# Patient Record
Sex: Female | Born: 1975 | Race: White | Hispanic: No
Health system: Southern US, Academic
[De-identification: ages and names within clinical notes are randomized; demographics above are authoritative.]

## PROBLEM LIST (undated history)

## (undated) ENCOUNTER — Non-Acute Institutional Stay: Payer: MEDICARE | Attending: Family | Primary: Family

## (undated) ENCOUNTER — Ambulatory Visit

## (undated) ENCOUNTER — Encounter

## (undated) ENCOUNTER — Encounter: Attending: Family | Primary: Family

## (undated) ENCOUNTER — Telehealth

## (undated) ENCOUNTER — Telehealth
Attending: Student in an Organized Health Care Education/Training Program | Primary: Student in an Organized Health Care Education/Training Program

## (undated) ENCOUNTER — Encounter: Attending: Ambulatory Care | Primary: Ambulatory Care

## (undated) ENCOUNTER — Ambulatory Visit: Payer: MEDICARE | Attending: Family | Primary: Family

## (undated) ENCOUNTER — Institutional Professional Consult (permissible substitution)

## (undated) ENCOUNTER — Telehealth: Attending: Family | Primary: Family

## (undated) ENCOUNTER — Encounter: Attending: Rheumatology | Primary: Rheumatology

## (undated) DIAGNOSIS — E119 Type 2 diabetes mellitus without complications: Secondary | ICD-10-CM

## (undated) DIAGNOSIS — J45909 Unspecified asthma, uncomplicated: Secondary | ICD-10-CM

## (undated) DIAGNOSIS — N200 Calculus of kidney: Secondary | ICD-10-CM

## (undated) DIAGNOSIS — E079 Disorder of thyroid, unspecified: Secondary | ICD-10-CM

## (undated) DIAGNOSIS — K219 Gastro-esophageal reflux disease without esophagitis: Secondary | ICD-10-CM

## (undated) DIAGNOSIS — M1990 Unspecified osteoarthritis, unspecified site: Secondary | ICD-10-CM

## (undated) HISTORY — DX: Disorder of thyroid, unspecified: E07.9

## (undated) HISTORY — DX: Gastro-esophageal reflux disease without esophagitis: K21.9

## (undated) HISTORY — DX: Unspecified osteoarthritis, unspecified site: M19.90

## (undated) HISTORY — DX: Unspecified asthma, uncomplicated: J45.909

## (undated) HISTORY — DX: Type 2 diabetes mellitus without complications: E11.9

---
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
## 2005-08-07 ENCOUNTER — Ambulatory Visit: Payer: Self-pay | Admitting: Family Medicine

## 2006-04-19 ENCOUNTER — Emergency Department: Payer: Self-pay | Admitting: Emergency Medicine

## 2006-12-30 ENCOUNTER — Ambulatory Visit: Payer: Self-pay | Admitting: Family Medicine

## 2009-12-21 ENCOUNTER — Emergency Department: Payer: Self-pay | Admitting: Emergency Medicine

## 2010-04-06 ENCOUNTER — Emergency Department: Payer: Self-pay | Admitting: Emergency Medicine

## 2011-01-07 ENCOUNTER — Ambulatory Visit: Payer: Self-pay | Admitting: Family Medicine

## 2011-07-14 ENCOUNTER — Other Ambulatory Visit: Payer: Self-pay | Admitting: Family Medicine

## 2011-08-21 ENCOUNTER — Ambulatory Visit: Payer: Self-pay | Admitting: Family Medicine

## 2014-02-02 ENCOUNTER — Emergency Department: Payer: Self-pay | Admitting: Emergency Medicine

## 2014-02-02 LAB — URINALYSIS, COMPLETE
BACTERIA: NONE SEEN
BILIRUBIN, UR: NEGATIVE
BLOOD: NEGATIVE
Glucose,UR: NEGATIVE mg/dL (ref 0–75)
Nitrite: NEGATIVE
PH: 6 (ref 4.5–8.0)
RBC,UR: 1 /HPF (ref 0–5)
Specific Gravity: 1.018 (ref 1.003–1.030)
Squamous Epithelial: 7
WBC UR: 11 /HPF (ref 0–5)

## 2014-02-02 LAB — COMPREHENSIVE METABOLIC PANEL
ALBUMIN: 3.5 g/dL (ref 3.4–5.0)
ALK PHOS: 143 U/L — AB
AST: 46 U/L — AB (ref 15–37)
Anion Gap: 11 (ref 7–16)
BUN: 11 mg/dL (ref 7–18)
Bilirubin,Total: 0.6 mg/dL (ref 0.2–1.0)
Calcium, Total: 9 mg/dL (ref 8.5–10.1)
Chloride: 101 mmol/L (ref 98–107)
Co2: 22 mmol/L (ref 21–32)
Creatinine: 0.9 mg/dL (ref 0.60–1.30)
EGFR (African American): >60
EGFR (Non-African Amer.): >60
Glucose: 180 mg/dL — ABNORMAL HIGH (ref 65–99)
Osmolality: 272 (ref 275–301)
Potassium: 3.6 mmol/L (ref 3.5–5.1)
SGPT (ALT): 51 U/L (ref 12–78)
SODIUM: 134 mmol/L — AB (ref 136–145)
Total Protein: 8.8 g/dL — ABNORMAL HIGH (ref 6.4–8.2)

## 2014-02-02 LAB — CBC WITH DIFFERENTIAL/PLATELET
BASOS ABS: 0.1 10*3/uL (ref 0.0–0.1)
BASOS PCT: 0.7 %
EOS ABS: 0.2 10*3/uL (ref 0.0–0.7)
Eosinophil %: 1 %
HCT: 39.1 % (ref 35.0–47.0)
HGB: 13 g/dL (ref 12.0–16.0)
LYMPHS PCT: 20.3 %
Lymphocyte #: 3.4 10*3/uL (ref 1.0–3.6)
MCH: 27 pg (ref 26.0–34.0)
MCHC: 33.2 g/dL (ref 32.0–36.0)
MCV: 81 fL (ref 80–100)
MONO ABS: 0.9 x10 3/mm (ref 0.2–0.9)
MONOS PCT: 5.2 %
Neutrophil #: 12.1 10*3/uL — ABNORMAL HIGH (ref 1.4–6.5)
Neutrophil %: 72.8 %
Platelet: 352 10*3/uL (ref 150–440)
RBC: 4.82 10*6/uL (ref 3.80–5.20)
RDW: 13.4 % (ref 11.5–14.5)
WBC: 16.6 10*3/uL — ABNORMAL HIGH (ref 3.6–11.0)

## 2014-02-02 LAB — LIPASE, BLOOD: Lipase: 119 U/L (ref 73–393)

## 2014-03-07 ENCOUNTER — Ambulatory Visit: Payer: Self-pay

## 2014-04-06 ENCOUNTER — Ambulatory Visit: Payer: Self-pay

## 2015-04-09 DIAGNOSIS — M069 Rheumatoid arthritis, unspecified: Secondary | ICD-10-CM | POA: Insufficient documentation

## 2015-04-09 DIAGNOSIS — E039 Hypothyroidism, unspecified: Secondary | ICD-10-CM | POA: Insufficient documentation

## 2015-04-15 ENCOUNTER — Emergency Department
Admit: 2015-04-15 | Disposition: A | Discharge status: Home / Self Care | Payer: Self-pay | Admitting: Emergency Medicine

## 2015-04-15 LAB — URINALYSIS, COMPLETE
BLOOD: NEGATIVE
Bilirubin,UR: NEGATIVE
Glucose,UR: NEGATIVE mg/dL (ref 0–75)
Ketone: NEGATIVE
LEUKOCYTE ESTERASE: NEGATIVE
NITRITE: NEGATIVE
PH: 5 (ref 4.5–8.0)
Protein: NEGATIVE
SPECIFIC GRAVITY: 1.025 (ref 1.003–1.030)

## 2015-04-23 ENCOUNTER — Ambulatory Visit: Payer: Self-pay

## 2015-05-06 ENCOUNTER — Emergency Department: Payer: Self-pay

## 2015-05-06 ENCOUNTER — Emergency Department
Admission: EM | Admit: 2015-05-06 | Discharge: 2015-05-06 | Disposition: A | Discharge status: Home / Self Care | Payer: Self-pay | Attending: Emergency Medicine | Admitting: Emergency Medicine

## 2015-05-06 ENCOUNTER — Encounter: Payer: Self-pay | Admitting: General Practice

## 2015-05-06 DIAGNOSIS — Z3202 Encounter for pregnancy test, result negative: Secondary | ICD-10-CM | POA: Insufficient documentation

## 2015-05-06 DIAGNOSIS — R319 Hematuria, unspecified: Secondary | ICD-10-CM

## 2015-05-06 DIAGNOSIS — N200 Calculus of kidney: Secondary | ICD-10-CM | POA: Insufficient documentation

## 2015-05-06 DIAGNOSIS — Z87891 Personal history of nicotine dependence: Secondary | ICD-10-CM | POA: Insufficient documentation

## 2015-05-06 DIAGNOSIS — R109 Unspecified abdominal pain: Secondary | ICD-10-CM

## 2015-05-06 HISTORY — DX: Calculus of kidney: N20.0

## 2015-05-06 LAB — URINALYSIS COMPLETE WITH MICROSCOPIC (ARMC ONLY)
Bacteria, UA: NONE SEEN
Bilirubin Urine: NEGATIVE
Glucose, UA: NEGATIVE mg/dL
LEUKOCYTES UA: NEGATIVE
NITRITE: NEGATIVE
PH: 5 (ref 5.0–8.0)
PROTEIN: 30 mg/dL — AB
SPECIFIC GRAVITY, URINE: 1.038 — AB (ref 1.005–1.030)
WBC, UA: NONE SEEN WBC/hpf (ref 0–5)

## 2015-05-06 LAB — COMPREHENSIVE METABOLIC PANEL
ALT: 24 U/L (ref 14–54)
AST: 30 U/L (ref 15–41)
Albumin: 3.7 g/dL (ref 3.5–5.0)
Alkaline Phosphatase: 114 U/L (ref 38–126)
Anion gap: 11 (ref 5–15)
BUN: 12 mg/dL (ref 6–20)
CO2: 21 mmol/L — AB (ref 22–32)
Calcium: 8.9 mg/dL (ref 8.9–10.3)
Chloride: 105 mmol/L (ref 101–111)
Creatinine, Ser: 0.84 mg/dL (ref 0.44–1.00)
GFR calc Af Amer: >60 mL/min (ref >60)
GLUCOSE: 145 mg/dL — AB (ref 65–99)
Potassium: 3.9 mmol/L (ref 3.5–5.1)
Sodium: 137 mmol/L (ref 135–145)
Total Bilirubin: 0.6 mg/dL (ref 0.3–1.2)
Total Protein: 8.1 g/dL (ref 6.5–8.1)

## 2015-05-06 LAB — CBC WITH DIFFERENTIAL/PLATELET
Basophils Absolute: 0.1 10*3/uL (ref 0–0.1)
Basophils Relative: 1 %
Eosinophils Absolute: 0.1 10*3/uL (ref 0–0.7)
Eosinophils Relative: 0 %
HCT: 38.1 % (ref 35.0–47.0)
Hemoglobin: 12.7 g/dL (ref 12.0–16.0)
LYMPHS ABS: 2.8 10*3/uL (ref 1.0–3.6)
Lymphocytes Relative: 16 %
MCH: 26.2 pg (ref 26.0–34.0)
MCHC: 33.3 g/dL (ref 32.0–36.0)
MCV: 78.8 fL — AB (ref 80.0–100.0)
MONOS PCT: 5 %
Monocytes Absolute: 0.8 10*3/uL (ref 0.2–0.9)
Neutro Abs: 13.6 10*3/uL — ABNORMAL HIGH (ref 1.4–6.5)
Neutrophils Relative %: 78 %
PLATELETS: 345 10*3/uL (ref 150–440)
RBC: 4.83 MIL/uL (ref 3.80–5.20)
RDW: 14 % (ref 11.5–14.5)
WBC: 17.4 10*3/uL — ABNORMAL HIGH (ref 3.6–11.0)

## 2015-05-06 LAB — POCT PREGNANCY, URINE: Preg Test, Ur: NEGATIVE

## 2015-05-06 LAB — LIPASE, BLOOD: Lipase: 26 U/L (ref 22–51)

## 2015-05-06 MED ORDER — MORPHINE SULFATE 4 MG/ML IJ SOLN
4.0000 mg | Freq: Once | INTRAMUSCULAR | Status: AC
  Administered 2015-05-06: 4 mg via INTRAVENOUS

## 2015-05-06 MED ORDER — ONDANSETRON HCL 4 MG/2ML IJ SOLN
4.0000 mg | Freq: Once | INTRAMUSCULAR | Status: AC
  Administered 2015-05-06: 4 mg via INTRAVENOUS

## 2015-05-06 MED ORDER — SODIUM CHLORIDE 0.9 % IV BOLUS (SEPSIS)
1000.0000 mL | Freq: Once | INTRAVENOUS | Status: AC
  Administered 2015-05-06: 1000 mL via INTRAVENOUS

## 2015-05-06 MED ORDER — TAMSULOSIN HCL 0.4 MG PO CAPS: 0.4000 mg | ORAL_CAPSULE | Freq: Every day | ORAL | Status: DC

## 2015-05-06 MED ORDER — ONDANSETRON HCL 4 MG/2ML IJ SOLN
INTRAMUSCULAR | Status: AC
Start: 2015-05-06 — End: 2015-05-06
  Administered 2015-05-06: 4 mg via INTRAVENOUS
  Filled 2015-05-06: qty 2

## 2015-05-06 MED ORDER — MORPHINE SULFATE 4 MG/ML IJ SOLN
INTRAMUSCULAR | Status: AC
  Filled 2015-05-06: qty 1

## 2015-05-06 MED ORDER — ONDANSETRON HCL 4 MG/2ML IJ SOLN
INTRAMUSCULAR | Status: AC
  Filled 2015-05-06: qty 2

## 2015-05-06 MED ORDER — KETOROLAC TROMETHAMINE 30 MG/ML IJ SOLN
30.0000 mg | Freq: Once | INTRAMUSCULAR | Status: AC
  Administered 2015-05-06: 30 mg via INTRAVENOUS

## 2015-05-06 MED ORDER — OXYCODONE-ACETAMINOPHEN 5-325 MG PO TABS: 1.0000 | ORAL_TABLET | ORAL | Status: DC | PRN

## 2015-05-06 MED ORDER — ONDANSETRON HCL 4 MG PO TABS: 4.0000 mg | ORAL_TABLET | Freq: Every day | ORAL | Status: AC | PRN

## 2015-05-06 MED ORDER — KETOROLAC TROMETHAMINE 30 MG/ML IJ SOLN
INTRAMUSCULAR | Status: AC
  Administered 2015-05-06: 30 mg via INTRAVENOUS
  Filled 2015-05-06: qty 1

## 2015-05-06 NOTE — ED Provider Notes (Signed)
[facility] Emergency Department Provider Note   ____________________________________________  Time seen: 1740  I have reviewed the triage vital signs and the nursing notes.   HISTORY  Chief Complaint Abdominal Pain   History limited by: Not Limited   HPI Ashley Cochran is a 39 y.o. female who presents to the emergency department because of left flank pain. This pain is been going on for 2 days. It is waxing and waning. Patient states it starts in her back and wraps around towards the front. She has had nausea and vomiting with pain. She states she has had kidney stones in the past. She denies any dysuria or hematuria. Denies any fevers.   Past Medical History  Diagnosis Date  . Kidney stones     There are no active problems to display for this patient.   No past surgical history on file.  Current Outpatient Rx  Name  Route  Sig  Dispense  Refill  . ondansetron (ZOFRAN) 4 MG tablet   Oral   Take 1 tablet (4 mg total) by mouth daily as needed for nausea or vomiting.   30 tablet   0   . oxyCODONE-acetaminophen (ROXICET) 5-325 MG per tablet   Oral   Take 1-2 tablets by mouth every 4 (four) hours as needed for severe pain.   20 tablet   0   . tamsulosin (FLOMAX) 0.4 MG CAPS capsule   Oral   Take 1 capsule (0.4 mg total) by mouth daily.   14 capsule   0     Allergies Review of patient's allergies indicates no known allergies.  No family history on file.  Social History History  Substance Use Topics  . Smoking status: Former Games developer  . Smokeless tobacco: Never Used  . Alcohol Use: No    Review of Systems  Constitutional: Negative for fever. Cardiovascular: Negative for chest pain. Respiratory: Negative for shortness of breath. Gastrointestinal: Left flank pain, nausea and vomiting Genitourinary: Negative for dysuria. Musculoskeletal: Negative for back pain. Skin: Negative for rash. Neurological: Negative for headaches,  focal weakness or numbness.   10-point ROS otherwise negative.  ____________________________________________   PHYSICAL EXAM:  VITAL SIGNS: ED Triage Vitals  Enc Vitals Group     BP 05/06/15 1635 142/64 mmHg     Pulse Rate 05/06/15 1635 80     Resp 05/06/15 1635 22     Temp 05/06/15 1635 97.8 F (36.6 C)     Temp Source 05/06/15 1635 Oral     SpO2 05/06/15 1635 99 %     Weight 05/06/15 1635 244 lb (110.678 kg)     Height 05/06/15 1635 5\' 6"  (1.676 m)     Head Cir --      Peak Flow --      Pain Score 05/06/15 1635 10   Constitutional: Alert and oriented. Appears to be to be in mild pain Eyes: Conjunctivae are normal. PERRL. Normal extraocular movements. ENT   Head: Normocephalic and atraumatic.   Nose: No congestion/rhinnorhea.   Mouth/Throat: Mucous membranes are moist.   Neck: No stridor. Hematological/Lymphatic/Immunilogical: No cervical lymphadenopathy. Cardiovascular: Normal rate, regular rhythm.  No murmurs, rubs, or gallops. Respiratory: Normal respiratory effort without tachypnea nor retractions. Breath sounds are clear and equal bilaterally. No wheezes/rales/rhonchi. Gastrointestinal: Soft and minimally tender on the left side. No rebound, no guarding Genitourinary: Deferred Musculoskeletal: Normal range of motion in all extremities. No joint effusions.  No lower extremity tenderness nor edema. Neurologic:  Normal speech and language.  No gross focal neurologic deficits are appreciated. Speech is normal.  Skin:  Skin is warm, dry and intact. No rash noted. Psychiatric: Mood and affect are normal. Speech and behavior are normal. Patient exhibits appropriate insight and judgment.  ____________________________________________    LABS (pertinent positives/negatives)  Labs Reviewed  CBC WITH DIFFERENTIAL/PLATELET - Abnormal; Notable for the following:    WBC 17.4 (*)    MCV 78.8 (*)    Neutro Abs 13.6 (*)    All other components within normal limits   COMPREHENSIVE METABOLIC PANEL - Abnormal; Notable for the following:    CO2 21 (*)    Glucose, Bld 145 (*)    All other components within normal limits  URINALYSIS COMPLETEWITH MICROSCOPIC ([facility])  - Abnormal; Notable for the following:    Color, Urine AMBER (*)    APPearance TURBID (*)    Ketones, ur TRACE (*)    Specific Gravity, Urine 1.038 (*)    Hgb urine dipstick 1+ (*)    Protein, ur 30 (*)    Squamous Epithelial / LPF 0-5 (*)    All other components within normal limits  LIPASE, BLOOD  POC URINE PREG, ED  POCT PREGNANCY, URINE     ____________________________________________   EKG  None  ____________________________________________    RADIOLOGY  Renal ultrasound IMPRESSION: 1. No hydronephrosis. 2. 9 mm LEFT renal collecting system calculus.  ____________________________________________   PROCEDURES  Procedure(s) performed: None  Critical Care performed: No  ____________________________________________   INITIAL IMPRESSION / ASSESSMENT AND PLAN / ED COURSE  Pertinent labs & imaging results that were available during my care of the patient were reviewed by me and considered in my medical decision making (see chart for details).  Patient here with left flank pain, nausea and vomiting. Patient does have history of kidney stones. Patient does have blood in her urine. Ultrasound does not show any hydronephrosis overdose showing 9 mm left renal collecting system calculus. Bilateral ureteral jets. Think likely patient is suffering from kidney stone. We will treat with pain medication and antiemetics and Flomax. Will give patient urology numbers for follow-up as needed.  ____________________________________________   FINAL CLINICAL IMPRESSION(S) / ED DIAGNOSES  Final diagnoses:  Hematuria  Flank pain  Nephrolithiasis     Phineas Semen, MD 05/06/15 2029

## 2015-05-06 NOTE — ED Notes (Signed)
Past 3 days c/o left side abd, vomited today

## 2015-05-06 NOTE — ED Notes (Signed)
Pt. Arrived to ed from home with reports of LLQ pain x 3 days. Pt reports started gradually but has increased over the last 24hrs. Pt denies urinary symptoms at this time. Reports hx of kidney stones.  PT alert and oriented, screaming out with pain. Pt reports episode of nausea and vomiting today.

## 2015-05-06 NOTE — Discharge Instructions (Signed)
Please seek medical attention for any high fevers, chest pain, shortness of breath, change in behavior, persistent vomiting, bloody stool or any other new or concerning symptoms.  Abdominal Pain, Women Abdominal (stomach, pelvic, or belly) pain can be caused by many things. It is important to tell your doctor:  The location of the pain.  Does it come and go or is it present all the time?  Are there things that start the pain (eating certain foods, exercise)?  Are there other symptoms associated with the pain (fever, nausea, vomiting, diarrhea)? All of this is helpful to know when trying to find the cause of the pain. CAUSES   Stomach: virus or bacteria infection, or ulcer.  Intestine: appendicitis (inflamed appendix), regional ileitis (Crohn's disease), ulcerative colitis (inflamed colon), irritable bowel syndrome, diverticulitis (inflamed diverticulum of the colon), or cancer of the stomach or intestine.  Gallbladder disease or stones in the gallbladder.  Kidney disease, kidney stones, or infection.  Pancreas infection or cancer.  Fibromyalgia (pain disorder).  Diseases of the female organs:  Uterus: fibroid (non-cancerous) tumors or infection.  Fallopian tubes: infection or tubal pregnancy.  Ovary: cysts or tumors.  Pelvic adhesions (scar tissue).  Endometriosis (uterus lining tissue growing in the pelvis and on the pelvic organs).  Pelvic congestion syndrome (female organs filling up with blood just before the menstrual period).  Pain with the menstrual period.  Pain with ovulation (producing an egg).  Pain with an IUD (intrauterine device, birth control) in the uterus.  Cancer of the female organs.  Functional pain (pain not caused by a disease, may improve without treatment).  Psychological pain.  Depression. DIAGNOSIS  Your doctor will decide the seriousness of your pain by doing an examination.  Blood tests.  X-rays.  Ultrasound.  CT scan  (computed tomography, special type of X-ray).  MRI (magnetic resonance imaging).  Cultures, for infection.  Barium enema (dye inserted in the large intestine, to better view it with X-rays).  Colonoscopy (looking in intestine with a lighted tube).  Laparoscopy (minor surgery, looking in abdomen with a lighted tube).  Major abdominal exploratory surgery (looking in abdomen with a large incision). TREATMENT  The treatment will depend on the cause of the pain.   Many cases can be observed and treated at home.  Over-the-counter medicines recommended by your caregiver.  Prescription medicine.  Antibiotics, for infection.  Birth control pills, for painful periods or for ovulation pain.  Hormone treatment, for endometriosis.  Nerve blocking injections.  Physical therapy.  Antidepressants.  Counseling with a psychologist or psychiatrist.  Minor or major surgery. HOME CARE INSTRUCTIONS   Do not take laxatives, unless directed by your caregiver.  Take over-the-counter pain medicine only if ordered by your caregiver. Do not take aspirin because it can cause an upset stomach or bleeding.  Try a clear liquid diet (broth or water) as ordered by your caregiver. Slowly move to a bland diet, as tolerated, if the pain is related to the stomach or intestine.  Have a thermometer and take your temperature several times a day, and record it.  Bed rest and sleep, if it helps the pain.  Avoid sexual intercourse, if it causes pain.  Avoid stressful situations.  Keep your follow-up appointments and tests, as your caregiver orders.  If the pain does not go away with medicine or surgery, you may try:  Acupuncture.  Relaxation exercises (yoga, meditation).  Group therapy.  Counseling. SEEK MEDICAL CARE IF:   You notice certain foods cause stomach  pain.  Your home care treatment is not helping your pain.  You need stronger pain medicine.  You want your IUD removed.  You  feel faint or lightheaded.  You develop nausea and vomiting.  You develop a rash.  You are having side effects or an allergy to your medicine. SEEK IMMEDIATE MEDICAL CARE IF:   Your pain does not go away or gets worse.  You have a fever.  Your pain is felt only in portions of the abdomen. The right side could possibly be appendicitis. The left lower portion of the abdomen could be colitis or diverticulitis.  You are passing blood in your stools (bright red or black tarry stools, with or without vomiting).  You have blood in your urine.  You develop chills, with or without a fever.  You pass out. MAKE SURE YOU:   Understand these instructions.  Will watch your condition.  Will get help right away if you are not doing well or get worse. Document Released: 10/04/2007 Document Revised: 04/23/2014 Document Reviewed: 10/24/2009 [facility] Patient Information 2015 [place], [place]. This information is not intended to replace advice given to you by your health care provider. Make sure you discuss any questions you have with your health care provider.  Kidney Stones Kidney stones (urolithiasis) are solid masses that form inside your kidneys. The intense pain is caused by the stone moving through the kidney, ureter, bladder, and urethra (urinary tract). When the stone moves, the ureter starts to spasm around the stone. The stone is usually passed in your pee (urine).  HOME CARE  Drink enough fluids to keep your pee clear or pale yellow. This helps to get the stone out.  Strain all pee through the provided strainer. Do not pee without peeing through the strainer, not even once. If you pee the stone out, catch it in the strainer. The stone may be as small as a grain of salt. Take this to your doctor. This will help your doctor figure out what you can do to try to prevent more kidney stones.  Only take medicine as told by your doctor.  Follow up with your doctor as told.  Get follow-up  X-rays as told by your doctor. GET HELP IF: You have pain that gets worse even if you have been taking pain medicine. GET HELP RIGHT AWAY IF:   Your pain does not get better with medicine.  You have a fever or shaking chills.  Your pain increases and gets worse over 18 hours.  You have new belly (abdominal) pain.  You feel faint or pass out.  You are unable to pee. MAKE SURE YOU:   Understand these instructions.  Will watch your condition.  Will get help right away if you are not doing well or get worse. Document Released: 05/25/2008 Document Revised: 08/09/2013 Document Reviewed: 05/10/2013 [facility] Patient Information 2015 [place], [place]. This information is not intended to replace advice given to you by your health care provider. Make sure you discuss any questions you have with your health care provider.

## 2015-05-06 NOTE — ED Notes (Signed)
Patient transported to Ultrasound 

## 2015-05-06 NOTE — ED Notes (Signed)
Pt returned from [place] and states she is still vomiting and has continued pain. Lights out to enhance rest and visitor by the beside. MD aware.

## 2015-05-23 ENCOUNTER — Other Ambulatory Visit: Payer: Self-pay

## 2015-05-28 ENCOUNTER — Ambulatory Visit: Payer: Self-pay

## 2015-06-27 ENCOUNTER — Other Ambulatory Visit: Payer: Self-pay

## 2015-07-02 ENCOUNTER — Other Ambulatory Visit: Payer: Self-pay

## 2015-07-09 ENCOUNTER — Institutional Professional Consult (permissible substitution): Payer: Self-pay

## 2015-07-09 LAB — LIPID PANEL
CHOLESTEROL: 141 mg/dL (ref 0–200)
HDL: 31 mg/dL — AB (ref 35–70)
LDL Cholesterol: 74 mg/dL
Triglycerides: 181 mg/dL — AB (ref 40–160)

## 2015-07-09 LAB — MICROALBUMIN, URINE: MICROALB UR: 3.8

## 2015-09-24 ENCOUNTER — Other Ambulatory Visit: Payer: Self-pay

## 2015-09-24 ENCOUNTER — Ambulatory Visit: Payer: Self-pay

## 2015-09-24 LAB — BASIC METABOLIC PANEL
BUN: 12 mg/dL (ref 4–21)
Creatinine: 0.5 mg/dL (ref 0.5–1.1)
GLUCOSE: 123 mg/dL
Potassium: 4.2 mmol/L (ref 3.4–5.3)
Sodium: 136 mmol/L — AB (ref 137–147)

## 2015-09-24 LAB — HEPATIC FUNCTION PANEL
ALT: 36 U/L — AB (ref 7–35)
AST: 38 U/L — AB (ref 13–35)
Alkaline Phosphatase: 125 U/L (ref 25–125)
Bilirubin, Total: 0.2 mg/dL

## 2015-10-08 ENCOUNTER — Institutional Professional Consult (permissible substitution): Payer: Self-pay

## 2015-10-22 ENCOUNTER — Ambulatory Visit: Payer: Self-pay

## 2015-10-22 DIAGNOSIS — R55 Syncope and collapse: Secondary | ICD-10-CM | POA: Insufficient documentation

## 2015-11-21 ENCOUNTER — Other Ambulatory Visit: Payer: Self-pay

## 2015-11-21 LAB — TSH: TSH: 4.6 u[IU]/mL (ref <=5.90)

## 2015-11-28 ENCOUNTER — Ambulatory Visit: Payer: Self-pay

## 2016-01-09 ENCOUNTER — Other Ambulatory Visit: Payer: Self-pay

## 2016-02-11 ENCOUNTER — Institutional Professional Consult (permissible substitution): Payer: Self-pay

## 2016-02-11 LAB — HEMOGLOBIN A1C: Hemoglobin A1C: 6.5

## 2016-03-12 DIAGNOSIS — R55 Syncope and collapse: Secondary | ICD-10-CM

## 2016-03-12 DIAGNOSIS — E119 Type 2 diabetes mellitus without complications: Secondary | ICD-10-CM

## 2016-03-12 DIAGNOSIS — Z794 Long term (current) use of insulin: Principal | ICD-10-CM

## 2016-03-12 DIAGNOSIS — E039 Hypothyroidism, unspecified: Secondary | ICD-10-CM

## 2016-05-05 ENCOUNTER — Ambulatory Visit: Payer: Self-pay

## 2016-05-05 DIAGNOSIS — E119 Type 2 diabetes mellitus without complications: Secondary | ICD-10-CM

## 2016-05-05 DIAGNOSIS — E039 Hypothyroidism, unspecified: Secondary | ICD-10-CM

## 2016-05-05 NOTE — Progress Notes (Unsigned)
[facility] Progress Note  05/05/2016  Name: Ashley Cochran  MRN: [id number]  Date of Birth: February 20, 1976  Chief Complaint: No chief complaint on file. follow up diabetes and hypothyroidism.  HPI: HPI   40 y.o. female seen in follow up. 1. Type 2 diabetes. Last A1c in 01/2016 was  6.5%. Only medication for diabetes is Victoza, which she tolerates. No known complications of diabetes. Up to date on annual eye exam. Does not check sugars often. Has a glucometer, but when she checked previously sugars were consistently in low 100s.  2. Hypothyroidism. Takes daily levothyroxine. No recent dose changes.   Past Medical History:  Past Medical History  Diagnosis Date  . Kidney stones   . Diabetes mellitus without complication (HCC)   . Asthma   . GERD (gastroesophageal reflux disease)   . Thyroid disease   . Arthritis     Rheumatoid Arthritis    Past Surgical History: No past surgical history on file.  Past Family History:  Family History  Problem Relation Age of Onset  . Hypertension Mother   . Diabetes Mother     type 2  . Asthma Mother   . Stroke Mother     Mini strokes  . GER disease Mother   . Hypertension Father   . Diabetes Father     Type 2  . Asthma Father   . GER disease Father   . Asthma Sister   . Allergies Sister   . Asthma Brother   . Allergies Brother    Medications:   Medication List       This list is accurate as of: 05/05/16  6:56 PM.  Always use your most recent med list.               ADVAIR DISKUS 250-50 MCG/DOSE Aepb  Generic drug:  Fluticasone-Salmeterol  Inhale 1 puff into the lungs 2 (two) times daily.     albuterol 108 (90 Base) MCG/ACT inhaler  Commonly known as:  PROVENTIL HFA;VENTOLIN HFA  Inhale 2 puffs into the lungs every 6 (six) hours as needed for wheezing or shortness of breath.     cyclobenzaprine 10 MG tablet  Commonly known as:  FLEXERIL  Take 10 mg by mouth as needed for muscle spasms (every 8 hours  as needed for spasms.).     levothyroxine 175 MCG tablet  Commonly known as:  SYNTHROID, LEVOTHROID  Take 175 mcg by mouth at bedtime.     montelukast 10 MG tablet  Commonly known as:  SINGULAIR  Take 10 mg by mouth daily.     naproxen 500 MG tablet  Commonly known as:  NAPROSYN  Take 500 mg by mouth 2 (two) times daily with a meal.     omeprazole 20 MG capsule  Commonly known as:  PRILOSEC  Take 20 mg by mouth daily.     ondansetron 4 MG tablet  Commonly known as:  ZOFRAN  Take 1 tablet (4 mg total) by mouth daily as needed for nausea or vomiting.     oxyCODONE-acetaminophen 5-325 MG tablet  Commonly known as:  ROXICET  Take 1-2 tablets by mouth every 4 (four) hours as needed for severe pain.     tamsulosin 0.4 MG Caps capsule  Commonly known as:  FLOMAX  Take 1 capsule (0.4 mg total) by mouth daily.     VICTOZA 18 MG/3ML Sopn  Generic drug:  Liraglutide  Inject 1.8 mg into the skin daily.  General: alert, no distress and moderately obese Neck: no adenopathy, no carotid bruit, no JVD and thyroid not enlarged, symmetric, no tenderness/mass/nodules Resp: clear to auscultation bilaterally Cardio: regular rate and rhythm, S1, S2 normal, no murmur, click, rub or gallop GI: normal findings: bowel sounds normal and soft, non-tender Extremities: edema trace pitting bilaterally  Diabetic foot exam: normal sensation to monofilament, no calluses or ulcerations  Clinical Assessment & Plan 1. Type 2 diabetes, controlled. -Continue Victoza -Instructed her to check sugars daily, fasting. -Check labs today including A1c + urine ma/cr + metC and lipids.  2. Hypothyroidism -Check TSH and adjust dose of levothyroxine if needed.   Follow-up in 3 months.

## 2016-05-06 LAB — SPECIMEN STATUS

## 2016-05-07 LAB — MICROALBUMIN / CREATININE URINE RATIO
CREATININE, UR: 247.1 mg/dL
MICROALB/CREAT RATIO: 3.6 mg/g creat (ref 0.0–30.0)
Microalbumin, Urine: 8.8 ug/mL

## 2016-05-07 LAB — COMPREHENSIVE METABOLIC PANEL

## 2016-05-07 LAB — LIPID PANEL

## 2016-05-07 LAB — SPECIMEN STATUS REPORT

## 2016-05-07 LAB — TSH

## 2016-07-15 ENCOUNTER — Other Ambulatory Visit: Payer: Self-pay | Admitting: Urology

## 2016-07-16 ENCOUNTER — Other Ambulatory Visit: Payer: Self-pay | Admitting: Internal Medicine

## 2016-08-04 ENCOUNTER — Ambulatory Visit: Payer: Self-pay

## 2016-08-19 DIAGNOSIS — N200 Calculus of kidney: Secondary | ICD-10-CM | POA: Insufficient documentation

## 2016-08-19 HISTORY — DX: Calculus of kidney: N20.0

## 2016-08-20 ENCOUNTER — Other Ambulatory Visit: Payer: Self-pay | Admitting: Urology

## 2016-09-08 ENCOUNTER — Ambulatory Visit: Payer: Self-pay | Admitting: Endocrinology

## 2016-09-08 ENCOUNTER — Encounter: Payer: Self-pay | Admitting: Endocrinology

## 2016-09-08 DIAGNOSIS — E119 Type 2 diabetes mellitus without complications: Secondary | ICD-10-CM

## 2016-09-08 DIAGNOSIS — Z794 Long term (current) use of insulin: Principal | ICD-10-CM

## 2016-09-08 NOTE — Progress Notes (Signed)
Assessment:   Type 2 Diabetes without complication     Plan:    1.  Rx changes: none  2.  Education: Reviewed diabetes management:  Appropriate A1C target, avoid hypoglycemia,  blood pressure (<140/80), and cholesterol (LDL <100). -   Reviewed importance of good glycemic control to reduce risk for complications.   3.   Get regular physical activity at least 30 minutes a day of moderate intensity most days of the week  This discussion took at least 15 minutes out of a total visit time of 25 minutes today.  4. Follow up: I recommend patient follow-up with me at 3 months.   5. Referrals: none     Subjective:    Ashley Cochran is a 40 y.o. female who is seen in follow up forType 2 diabetes from 05/05/2016  complicated by obesity. T2DM for approximately 5 years.    Cardiovascular risk factors: dyslipidemia, obesity and physical inactivity   Eye exam current (within one year): yes on the 24 of August  .  Jacqulyn Barresi is performing SMBG on average 0-1 times per day. Is only checking when not feeling well.    Denies the following:  post prandial excursions, fasting hypoglycemia,  fasting hyperglycemia, overcorrection, rebound and high variability.  Reported syncope. Pain in toe and works caudally up to the calf -- have to walk. Feels like a cramping and notices a knot in calf during these episodes. Denies dehydration.   Reports no symptoms of hypoglycemia. Lowest blood sugar is in the mid 90s. Has seen a 150 in the past month only once. Reported average is 100.   Denies chest pain, shortness of breath, edema, foot lesions or ulcers. Denies nausea -- less than one month.   Denies severe hypoglycemia or admission to hospital for DKA.  Current exercise: none  The patient's history was reviewed and updated as appropriate.  Allergies  Allergen Reactions  . Latex   . Strawberry Flavor      Current Outpatient Prescriptions:  .  albuterol (PROVENTIL HFA;VENTOLIN HFA)  108 (90 Base) MCG/ACT inhaler, Inhale 2 puffs into the lungs every 6 (six) hours as needed for wheezing or shortness of breath., Disp: , Rfl:  .  cyclobenzaprine (FLEXERIL) 10 MG tablet, Take 10 mg by mouth as needed for muscle spasms (every 8 hours as needed for spasms.)., Disp: , Rfl:  .  Fluticasone-Salmeterol (ADVAIR DISKUS) 250-50 MCG/DOSE AEPB, Inhale 1 puff into the lungs 2 (two) times daily., Disp: , Rfl:  .  levothyroxine (SYNTHROID, LEVOTHROID) 175 MCG tablet, Take 175 mcg by mouth at bedtime., Disp: , Rfl:  .  Liraglutide (VICTOZA) 18 MG/3ML SOPN, Inject 1.8 mg into the skin daily., Disp: , Rfl:  .  montelukast (SINGULAIR) 10 MG tablet, Take 10 mg by mouth daily., Disp: , Rfl:  .  naproxen (NAPROSYN) 500 MG tablet, Take 500 mg by mouth 2 (two) times daily with a meal., Disp: , Rfl:  .  omeprazole (PRILOSEC) 20 MG capsule, TAKE ONE CAPSULE BY MOUTH EVERY DAY, Disp: 90 capsule, Rfl: 0 .  oxyCODONE-acetaminophen (ROXICET) 5-325 MG per tablet, Take 1-2 tablets by mouth every 4 (four) hours as needed for severe pain., Disp: 20 tablet, Rfl: 0 .  tamsulosin (FLOMAX) 0.4 MG CAPS capsule, Take 1 capsule (0.4 mg total) by mouth daily., Disp: 14 capsule, Rfl: 0  Social History   Social History  . Marital status: Married    Spouse name: N/A  . Number of children: N/A  . Years  of education: N/A   Social History Main Topics  . Smoking status: Former Smoker    Quit date: 09/20/2004  . Smokeless tobacco: Never Used  . Alcohol use No  . Drug use: No  . Sexual activity: Not on file   Other Topics Concern  . Not on file   Social History Narrative  . No narrative on file    Family History  Problem Relation Age of Onset  . Hypertension Mother   . Diabetes Mother     type 2  . Asthma Mother   . Stroke Mother     Mini strokes  . GER disease Mother   . Hypertension Father   . Diabetes Father     Type 2  . Asthma Father   . GER disease Father   . Asthma Sister   . Allergies Sister    . Asthma Brother   . Allergies Brother     Review of Systems A 12 point review of systems was negative except for pertinent items noted in the HPI.   Objective:     Wt Readings from Last 3 Encounters:  02/11/16 264 lb (119.7 kg)  05/06/15 244 lb (110.7 kg)   There were no vitals taken for this visit.  General appearance:  alert and appears stated age, in no distress      Eyes:  conjunctivae/corneas clear. EOM's intact. Sclera anicteric. Negative lid lag or proptosis     Neck: no adenopathy, supple, symmetrical, trachea midline  Thyroid:  Mobile, normal size, no palpable nodule  Lung: clear to auscultation bilaterally  Heart:  regular rate and rhythm, S1, S2 normal, no murmur, click, rub or gallop  Abdomen:  bowel sounds normal; negative bruits  Extremities: extremities normal, atraumatic, no cyanosis or edema     Pulses: DP & PT 2+ and symmetric.  Neuro: normal without focal findings, mental status, speech normal, alert and oriented x3, reflexes normal and symmetric and gait normal.   Feet: negative lesions or ulcers, 10 gram monofilament intact bilateral plantar surface. Very reactive to touch.      Lab Review No components found for: A1C Glucose (mg/dL)  Date Value  98/26/4158 CANCELED  02/02/2014 180 (H)   Glucose, Bld (mg/dL)  Date Value  30/94/0768 145 (H)   CO2 (mmol/L)  Date Value  05/06/2015 21 (L)   Co2 (mmol/L)  Date Value  02/02/2014 22   BUN (mg/dL)  Date Value  08/81/1031 12  05/06/2015 12  02/02/2014 11   Creatinine (mg/dL)  Date Value  59/45/8592 0.5  02/02/2014 0.90   Creatinine, Ser (mg/dL)  Date Value  92/44/6286 0.84   No components found for: LDL,  LDLCALC,  LDLDIRECT Lab Results  Component Value Date   NA 136 (A) 09/24/2015   K 4.2 09/24/2015   CL 105 05/06/2015   CO2 21 (L) 05/06/2015   BUN 12 09/24/2015   CREATININE 0.5 09/24/2015   GFRAA >60 05/06/2015   GFRNONAA >60 05/06/2015   GLU 123 09/24/2015   CALCIUM 8.9  05/06/2015   ALBUMIN 3.7 05/06/2015     DIABETES MELLITUS RESULTS: Lab Results  Component Value Date   HGBA1C 6.5 02/11/2016   Lab Results  Component Value Date   MICROALBUR 3.8 07/09/2015   LDLCALC 74 07/09/2015   CREATININE 0.5 09/24/2015   Lab Results  Component Value Date   CHOL CANCELED 05/05/2016   Lab Results  Component Value Date   LDLCALC 74 07/09/2015   No components found for:  CHOLLLDLDIRECT No components found for: MICROALB/CR Lab Results  Component Value Date   GFRAA >60 05/06/2015   GFRNONAA >60 05/06/2015

## 2016-11-06 ENCOUNTER — Other Ambulatory Visit: Payer: Self-pay | Admitting: Urology

## 2016-12-08 ENCOUNTER — Encounter: Payer: Self-pay | Admitting: Endocrinology

## 2016-12-08 ENCOUNTER — Ambulatory Visit: Payer: Self-pay | Admitting: Endocrinology

## 2016-12-08 DIAGNOSIS — E119 Type 2 diabetes mellitus without complications: Secondary | ICD-10-CM

## 2016-12-08 LAB — POCT GLYCOSYLATED HEMOGLOBIN (HGB A1C): Hemoglobin A1C: 5.7

## 2016-12-08 LAB — GLUCOSE, POCT (MANUAL RESULT ENTRY): POC GLUCOSE: 114 mg/dL — AB (ref 70–99)

## 2016-12-08 MED ORDER — OMEPRAZOLE 20 MG PO CPDR: 20.0000 mg | DELAYED_RELEASE_CAPSULE | Freq: Every day | ORAL | 4 refills | Status: DC

## 2016-12-08 MED ORDER — FLUTICASONE-SALMETEROL 250-50 MCG/DOSE IN AEPB: 1.0000 | INHALATION_SPRAY | Freq: Two times a day (BID) | RESPIRATORY_TRACT | 4 refills | Status: DC

## 2016-12-08 MED ORDER — LIRAGLUTIDE 18 MG/3ML ~~LOC~~ SOPN: 1.8000 mg | PEN_INJECTOR | Freq: Every day | SUBCUTANEOUS | 3 refills | Status: DC

## 2016-12-08 MED ORDER — NAPROXEN 500 MG PO TABS
250.0000 mg | ORAL_TABLET | Freq: Three times a day (TID) | ORAL | 4 refills | Status: DC | PRN
Start: 2016-12-08 — End: 2018-09-01

## 2016-12-08 MED ORDER — ONDANSETRON HCL 4 MG PO TABS: 4.0000 mg | ORAL_TABLET | Freq: Two times a day (BID) | ORAL | 1 refills | Status: DC

## 2016-12-08 MED ORDER — TRIAMCINOLONE ACETONIDE 55 MCG/ACT NA AERO: 2.0000 | INHALATION_SPRAY | Freq: Every day | NASAL | 4 refills | Status: DC

## 2016-12-08 MED ORDER — ALBUTEROL SULFATE HFA 108 (90 BASE) MCG/ACT IN AERS: 2.0000 | INHALATION_SPRAY | Freq: Four times a day (QID) | RESPIRATORY_TRACT | 4 refills | Status: DC | PRN

## 2016-12-08 MED ORDER — LEVOTHYROXINE SODIUM 125 MCG PO TABS: 125.0000 ug | ORAL_TABLET | Freq: Every day | ORAL | 4 refills | Status: DC

## 2016-12-08 MED ORDER — LEVOTHYROXINE SODIUM 175 MCG PO TABS: 175.0000 ug | ORAL_TABLET | Freq: Every day | ORAL | 4 refills | Status: DC

## 2016-12-08 MED ORDER — MONTELUKAST SODIUM 10 MG PO TABS: 10.0000 mg | ORAL_TABLET | Freq: Every day | ORAL | 4 refills | Status: DC

## 2016-12-08 MED ORDER — TRAZODONE HCL 50 MG PO TABS: 50.0000 mg | ORAL_TABLET | Freq: Every day | ORAL | 4 refills | Status: DC

## 2016-12-08 NOTE — Progress Notes (Signed)
[facility] Progress Note  12/08/2016  Name: Ashley Cochran  MRN: [id number]  Date of Birth: 09-02-1976  Chief Complaint:  Chief Complaint  Patient presents with  . Diabetes    HPI: HPI  Past Medical History:  Past Medical History:  Diagnosis Date  . Arthritis    Rheumatoid Arthritis  . Asthma   . Diabetes mellitus without complication (HCC)   . GERD (gastroesophageal reflux disease)   . Kidney stones   . Thyroid disease     Past Surgical History: No past surgical history on file.  Past Family History:  Family History  Problem Relation Age of Onset  . Hypertension Mother   . Diabetes Mother     type 2  . Asthma Mother   . Stroke Mother     Mini strokes  . GER disease Mother   . Hypertension Father   . Diabetes Father     Type 2  . Asthma Father   . GER disease Father   . Asthma Sister   . Allergies Sister   . Asthma Brother   . Allergies Brother     Diabetes Management:  Lab Results  Component Value Date   HGBA1C 6.5 02/11/2016    Meter here todayNo - glucose are 96-115 most of the time. Checking occasionally.   Hypoglycemia in the last 3 monthsNo - 2  Highest blood sugar seen in last 1-2 months? 115  Reported blood sugar average: 100  Trouble with managing blood sugar : none  New Complaints: none except for numbness of right arm depending on position in bed  Personal Diabetes Goal: work on diet and try to lose weight 1 pound per week.  Goal weight 185.      Clinical Assessment & Plan  Continue Victoza.   No change in other meds.   Had all the annual labs she needs at [facility] in October.  Will need TSH, fasting lipid panel and MA/Cr ratio in May

## 2016-12-08 NOTE — Addendum Note (Signed)
Addended by: Erin Sons on: 12/08/2016 08:38 PM   Modules accepted: Orders

## 2016-12-09 LAB — LIPID PANEL
Chol/HDL Ratio: 3.3 ratio units (ref 0.0–4.4)
Cholesterol, Total: 135 mg/dL (ref 100–199)
HDL: 41 mg/dL (ref >39)
LDL CALC: 71 mg/dL (ref 0–99)
Triglycerides: 116 mg/dL (ref 0–149)
VLDL CHOLESTEROL CAL: 23 mg/dL (ref 5–40)

## 2016-12-09 LAB — TSH: TSH: 7.69 u[IU]/mL — AB (ref 0.450–4.500)

## 2016-12-17 ENCOUNTER — Telehealth: Payer: Self-pay

## 2016-12-17 NOTE — Telephone Encounter (Signed)
Received 2 PAP application from [facility] for Ventolin HFA 90 MCG placed for provider to sign.

## 2016-12-24 NOTE — Telephone Encounter (Signed)
Placed signed application/script in [facility] folder for pickup. 

## 2017-01-14 ENCOUNTER — Other Ambulatory Visit: Payer: Self-pay

## 2017-01-14 NOTE — Telephone Encounter (Signed)
Received PAP application from [facility] for Advair 250/50 placed for provider to sign.

## 2017-01-26 NOTE — Telephone Encounter (Signed)
Please verify the documents [facility] has scanned into the patients documents file in EPIC.  If the patient has all eligibility please schedule the patient an appointment.  If the patient has documents missing please call them and let them know what to bring to become eligible. Any questions see Elliet or Henlee Donovan.

## 2017-02-11 ENCOUNTER — Ambulatory Visit: Payer: Self-pay | Admitting: Pharmacist

## 2017-02-11 ENCOUNTER — Encounter (INDEPENDENT_AMBULATORY_CARE_PROVIDER_SITE_OTHER): Payer: Self-pay

## 2017-02-11 VITALS — BP 110/70 | Wt 258.0 lb

## 2017-02-11 DIAGNOSIS — Z79899 Other long term (current) drug therapy: Secondary | ICD-10-CM

## 2017-02-11 NOTE — Progress Notes (Signed)
Medication Management Clinic Visit Note  Patient: Ashley Cochran MRN: [id number] Date of Birth: 30-Apr-1976 PCP: Zachery Dauer, FNP   [name] 41 y.o. female presents for a F/U MTM visit today. Most recent visit with [facility] endocrine was 12/08/16.  BP 110/70   Wt 258 lb (117 kg)   BMI 41.64 kg/m   Patient Information   Past Medical History:  Diagnosis Date  . Arthritis    Rheumatoid Arthritis  . Asthma   . Diabetes mellitus without complication (HCC)   . GERD (gastroesophageal reflux disease)   . Kidney stones   . Thyroid disease      No past surgical history on file.   Family History  Problem Relation Age of Onset  . Hypertension Mother   . Diabetes Mother     type 2  . Asthma Mother   . Stroke Mother     Mini strokes  . GER disease Mother   . Hypertension Father   . Diabetes Father     Type 2  . Asthma Father   . GER disease Father   . Asthma Sister   . Allergies Sister   . Asthma Brother   . Allergies Brother     New Diagnoses (since last visit):   Family Support: takes care of mom and aunt  Lifestyle Diet: Pt claims that there have not been many changes to her diet but she has decreased Pepsi intake and is not eating as much red meat.    Current Exercise Habits: The patient has a physically strenous job, but has no regular exercise apart from work.       History  Alcohol Use No      History  Smoking Status  . Former Smoker  . Quit date: 09/20/2004  Smokeless Tobacco  . Never Used      Health Maintenance  Topic Date Due  . PNEUMOCOCCAL POLYSACCHARIDE VACCINE (1) 12/07/1976  . FOOT EXAM  12/07/1984  . OPHTHALMOLOGY EXAM  12/07/1984  . HIV Screening  12/07/1989  . TETANUS/TDAP  12/07/1993  . PAP SMEAR  12/08/1995  . URINE MICROALBUMIN  05/05/2017  . HEMOGLOBIN A1C  06/08/2017  . INFLUENZA VACCINE  Completed     Prior to Admission medications   Medication Sig Start Date End Date Taking? Authorizing Provider  albuterol  (PROVENTIL HFA;VENTOLIN HFA) 108 (90 Base) MCG/ACT inhaler Inhale 2 puffs into the lungs every 6 (six) hours as needed for wheezing or shortness of breath. 12/08/16  Yes Maryjo Rochester, MD  aspirin-acetaminophen-caffeine (EXCEDRIN MIGRAINE) [phone] MG tablet Take by mouth.   Yes Historical Provider, MD  baclofen (LIORESAL) 20 MG tablet Take 20 mg by mouth.   Yes Historical Provider, MD  Etanercept 50 MG/ML SOCT Inject 1 Dose into the skin once a week.   Yes Historical Provider, MD  levothyroxine (SYNTHROID, LEVOTHROID) 125 MCG tablet Take 1 tablet (125 mcg total) by mouth at bedtime. Sunday, Tuesday, Wednesday, Friday, Saturday 12/08/16  Yes Maryjo Rochester, MD  levothyroxine (SYNTHROID, LEVOTHROID) 175 MCG tablet Take 1 tablet (175 mcg total) by mouth at bedtime. Monday and Thursday 12/08/16  Yes Maryjo Rochester, MD  liraglutide (VICTOZA) 18 MG/3ML SOPN Inject 0.3 mLs (1.8 mg total) into the skin daily. 12/08/16  Yes Maryjo Rochester, MD  montelukast (SINGULAIR) 10 MG tablet Take 1 tablet (10 mg total) by mouth daily. 12/08/16  Yes Maryjo Rochester, MD  naproxen (NAPROSYN) 500 MG tablet Take 0.5 tablets (250 mg total) by  mouth 3 (three) times daily as needed. 12/08/16  Yes Maryjo Rochester, MD  omeprazole (PRILOSEC) 20 MG capsule Take 1 capsule (20 mg total) by mouth daily. 12/08/16  Yes Maryjo Rochester, MD  ondansetron (ZOFRAN) 4 MG tablet Take 1 tablet (4 mg total) by mouth 2 (two) times daily. 12/08/16  Yes Maryjo Rochester, MD  OVER THE COUNTER MEDICATION Apply 1 application topically daily. Triderma MD   Yes Historical Provider, MD  traZODone (DESYREL) 50 MG tablet Take 1 tablet (50 mg total) by mouth at bedtime. 12/08/16  Yes Maryjo Rochester, MD  triamcinolone (NASACORT) 55 MCG/ACT AERO nasal inhaler Place 2 sprays into the nose daily. 12/08/16  Yes Maryjo Rochester, MD  cyclobenzaprine (FLEXERIL) 10 MG tablet Take 10 mg by mouth as needed for muscle spasms (every 8 hours as needed for spasms.).    Historical Provider, MD   Fluticasone-Salmeterol (ADVAIR DISKUS) 250-50 MCG/DOSE AEPB Inhale 1 puff into the lungs 2 (two) times daily. Patient not taking: Reported on 02/11/2017 12/08/16   Maryjo Rochester, MD     Assessment and Plan: Compliance/Adherance: Pt rarely misses doses and has good adherence.   RA: Using Embrel and naproxen -> says it's well controlled; being seen once a year now by rheumatologist (used to be more frequent); triggered by weather (rainy, cold);  Does a lot of physical work at [employer] in [employer] section so this can make it worse as wel  Asthma: Doesn't need rescue inhaler often unless it's really hot and muggy outside; otherwise no complaints  Hypothyroid: TSH 12/08/16 7.6 -  Takes levothyroxine on Sun, Tue, Wed,Fri, Sat, added two days a week on Mon and Thur. Takes Synthroid at night because it upsets her stomach in the am especially with Victoza  DM: A1c 12/08/16 5.7%. Taking only Victoza; only checks bs when feeling bad (~100) shaky and sweaty and will eat a piece of toast. Drinks 24oz Pepsi/day.We discussed healthy eating (meats, fruits, veggies) and trying to continue cutting back on Pepsi. Recommended switching to wheat toast from white but wheat upsets her stomach Pt goal from last visit - lose weight  HTN: Checks at home about a couple times a month because takes care of mom and aunt and she has to check their blood pressure. Always a normal reading  Denice Paradise, PharmD, RPh [facility]) [phone]

## 2017-03-05 ENCOUNTER — Telehealth: Payer: Self-pay | Admitting: Pharmacist

## 2017-03-05 NOTE — Telephone Encounter (Signed)
Patient has returned her proof of income for 2018 & tax forms for 2017, I have scanned. We have a pending application for Re Enrollment with [employer] for Advair & Ventolin -  We mailed patient on 12/10/16 her portion to sign and return, patient as of this date has not returned that form. I am remailing to patient today, also we had previously sent scripts to [facility] for provider to sign for re enrollment the scripts were signed Dec 10, 2016. Therefore the scripts are almost 48 days old and the manufacturer will want a more current date, I have reprinted the scripts and sending to [facility] for provider to sign. Once we have received the form back from patient and the scripts back from [facility] I will fax to [employer] for Re Enrollment.

## 2017-03-09 ENCOUNTER — Ambulatory Visit: Payer: Self-pay | Admitting: Physician Assistant

## 2017-03-09 VITALS — BP 120/74 | Wt 262.4 lb

## 2017-03-09 DIAGNOSIS — E119 Type 2 diabetes mellitus without complications: Secondary | ICD-10-CM

## 2017-03-09 DIAGNOSIS — Z794 Long term (current) use of insulin: Principal | ICD-10-CM

## 2017-03-09 DIAGNOSIS — E039 Hypothyroidism, unspecified: Secondary | ICD-10-CM

## 2017-03-09 NOTE — Progress Notes (Signed)
Assessment:   Ashley Cochran's A1c today is 6.1%.  We will order new labs including TSH and kidney function tests.     Plan:    1.  Rx changes: None 2. Follow up: I recommend patient follow-up with me at 3 months.    Subjective:    Ashley Cochran is a 41 y.o. female who is seen in follow up forType 2 diabetes from December 2017 complicated by obesity, RA, and thyroid problems.   Eye exam current (within one year): Yes  Current regimen: She takes Victosa (1.8) for her diabetes. Occasionally it makes her nauseous. She reports losing weight when she started [place] but has since gained it back.   Patient's glucometer was not downloaded and reviewed  Ashley Cochran is performing SMBG when she feels lightheaded, or flushed, or dizzy, but is low on strips.   Reports no symptoms of hypoglycemia. She tests when she feels lightheaded or shaky or flushed and reports 90-114. Lowest blood glucose in the 90s.   Denies chest pain, shortness of breath, edema, foot lesions or ulcers.  Denies severe hypoglycemia or admission to hospital for DKA.  Current exercise:   The patient's history was reviewed and updated as appropriate.  Allergies  Allergen Reactions  . Nitrofuran Derivatives Rash    jittery  . Latex   . Strawberry Flavor   . Tape Rash     Current Outpatient Prescriptions:  .  albuterol (PROVENTIL HFA;VENTOLIN HFA) 108 (90 Base) MCG/ACT inhaler, Inhale 2 puffs into the lungs every 6 (six) hours as needed for wheezing or shortness of breath., Disp: 3 Inhaler, Rfl: 4 .  aspirin-acetaminophen-caffeine (EXCEDRIN MIGRAINE) 250-250-65 MG tablet, Take by mouth., Disp: , Rfl:  .  baclofen (LIORESAL) 20 MG tablet, Take 20 mg by mouth., Disp: , Rfl:  .  cyclobenzaprine (FLEXERIL) 10 MG tablet, Take 10 mg by mouth as needed for muscle spasms (every 8 hours as needed for spasms.)., Disp: , Rfl:  .  Etanercept 50 MG/ML SOCT, Inject 1 Dose into the skin once a week., Disp: , Rfl:  .   Fluticasone-Salmeterol (ADVAIR DISKUS) 250-50 MCG/DOSE AEPB, Inhale 1 puff into the lungs 2 (two) times daily., Disp: 180 each, Rfl: 4 .  hydroxychloroquine (PLAQUENIL) 200 MG tablet, Take 200 mg by mouth 2 (two) times daily., Disp: , Rfl:  .  levothyroxine (SYNTHROID, LEVOTHROID) 125 MCG tablet, Take 1 tablet (125 mcg total) by mouth at bedtime. Sunday, Tuesday, Wednesday, Friday, Saturday, Disp: 60 tablet, Rfl: 4 .  levothyroxine (SYNTHROID, LEVOTHROID) 175 MCG tablet, Take 1 tablet (175 mcg total) by mouth at bedtime. Monday and Thursday, Disp: 30 tablet, Rfl: 4 .  liraglutide (VICTOZA) 18 MG/3ML SOPN, Inject 0.3 mLs (1.8 mg total) into the skin daily., Disp: 36 mL, Rfl: 3 .  montelukast (SINGULAIR) 10 MG tablet, Take 1 tablet (10 mg total) by mouth daily., Disp: 90 tablet, Rfl: 4 .  naproxen (NAPROSYN) 500 MG tablet, Take 0.5 tablets (250 mg total) by mouth 3 (three) times daily as needed., Disp: 200 tablet, Rfl: 4 .  omeprazole (PRILOSEC) 20 MG capsule, Take 1 capsule (20 mg total) by mouth daily., Disp: 90 capsule, Rfl: 4 .  ondansetron (ZOFRAN) 4 MG tablet, Take 1 tablet (4 mg total) by mouth 2 (two) times daily., Disp: 20 tablet, Rfl: 1 .  OVER THE COUNTER MEDICATION, Apply 1 application topically daily. Triderma MD, Disp: , Rfl:  .  traZODone (DESYREL) 50 MG tablet, Take 1 tablet (50 mg total) by mouth at bedtime.,  Disp: 90 tablet, Rfl: 4 .  triamcinolone (NASACORT) 55 MCG/ACT AERO nasal inhaler, Place 2 sprays into the nose daily., Disp: 3 Inhaler, Rfl: 4  Social History   Social History  . Marital status: Married    Spouse name: N/A  . Number of children: N/A  . Years of education: N/A   Social History Main Topics  . Smoking status: Former Smoker    Quit date: 09/20/2004  . Smokeless tobacco: Never Used  . Alcohol use No  . Drug use: No  . Sexual activity: Not on file   Other Topics Concern  . Not on file   Social History Narrative  . No narrative on file    Family  History  Problem Relation Age of Onset  . Hypertension Mother   . Diabetes Mother     type 2  . Asthma Mother   . Stroke Mother     Mini strokes  . GER disease Mother   . Hypertension Father   . Diabetes Father     Type 2  . Asthma Father   . GER disease Father   . Asthma Sister   . Allergies Sister   . Asthma Brother   . Allergies Brother     Review of Systems A 12 point review of systems was negative except for pertinent items noted in the HPI.   Objective:     Wt Readings from Last 3 Encounters:  03/09/17 262 lb 6.4 oz (119 kg)  02/11/17 258 lb (117 kg)  02/11/16 264 lb (119.7 kg)   BP 120/74   Wt 262 lb 6.4 oz (119 kg)   BMI 42.35 kg/m                                             Lab Review Her HbA1c today was 6.1%. POC glucose was 86.  Glucose (mg/dL)  Date Value  02/58/5277 CANCELED  02/02/2014 180 (H)   Glucose, Bld (mg/dL)  Date Value  82/42/3536 145 (H)   CO2 (mmol/L)  Date Value  05/06/2015 21 (L)   Co2 (mmol/L)  Date Value  02/02/2014 22   BUN (mg/dL)  Date Value  14/43/1540 12  05/06/2015 12  02/02/2014 11   Creatinine (mg/dL)  Date Value  08/67/6195 0.5  02/02/2014 0.90   Creatinine, Ser (mg/dL)  Date Value  09/32/6712 0.84   No components found for: LDL,  LDLCALC,  LDLDIRECT Lab Results  Component Value Date   NA 136 (A) 09/24/2015   K 4.2 09/24/2015   CL 105 05/06/2015   CO2 21 (L) 05/06/2015   BUN 12 09/24/2015   CREATININE 0.5 09/24/2015   GFRAA >60 05/06/2015   GFRNONAA >60 05/06/2015   GLU 123 09/24/2015   CALCIUM 8.9 05/06/2015   ALBUMIN 3.7 05/06/2015     DIABETES MELLITUS RESULTS: Lab Results  Component Value Date   HGBA1C 5.7 12/08/2016   HGBA1C 6.5 02/11/2016   Lab Results  Component Value Date   MICROALBUR 3.8 07/09/2015   LDLCALC 71 12/08/2016   CREATININE 0.5 09/24/2015   Lab Results  Component Value Date   CHOL 135 12/08/2016   Lab Results  Component Value Date   LDLCALC  71 12/08/2016   No components found for: CHOLLLDLDIRECT No components found for: MICROALB/CR Lab Results  Component Value Date   GFRAA >60 05/06/2015   GFRNONAA >60  05/06/2015   

## 2017-03-10 ENCOUNTER — Telehealth: Payer: Self-pay

## 2017-03-10 LAB — CREATININE, SERUM

## 2017-03-10 NOTE — Telephone Encounter (Signed)
Received PAP application from [facility] for Ventolin and Advair placed for provider to sign.

## 2017-03-11 LAB — MICROALBUMIN / CREATININE URINE RATIO
Creatinine, Urine: 138.4 mg/dL
MICROALB/CREAT RATIO: 6.9 mg/g{creat} (ref 0.0–30.0)
MICROALBUM., U, RANDOM: 9.6 ug/mL

## 2017-03-11 LAB — POCT GLYCOSYLATED HEMOGLOBIN (HGB A1C): HEMOGLOBIN A1C: 6.1

## 2017-03-11 LAB — GLUCOSE, POCT (MANUAL RESULT ENTRY): POC GLUCOSE: 86 mg/dL (ref 70–99)

## 2017-03-11 NOTE — Addendum Note (Signed)
Addended by: Smiley Houseman D on: 03/11/2017 01:32 PM   Modules accepted: Orders

## 2017-03-11 NOTE — Telephone Encounter (Signed)
Placed signed application/script in [facility] folder for pickup. 

## 2017-03-16 ENCOUNTER — Other Ambulatory Visit: Payer: Self-pay

## 2017-03-16 DIAGNOSIS — E039 Hypothyroidism, unspecified: Secondary | ICD-10-CM

## 2017-03-17 LAB — TSH: TSH: 3.35 u[IU]/mL (ref 0.450–4.500)

## 2017-03-25 ENCOUNTER — Telehealth: Payer: Self-pay | Admitting: Pharmacist

## 2017-03-25 NOTE — Telephone Encounter (Signed)
03/25/17 Faxed re enrollment application to [employer] for Ventolin HFA Inhale 2 puffs into the lungs every six hours as needed for wheezing or shortness of breath, also Advair 250/50 Inhale 1 puff two times a day, rinse mouth & spit after each use.

## 2017-04-09 ENCOUNTER — Telehealth: Payer: Self-pay | Admitting: Pharmacist

## 2017-04-09 NOTE — Telephone Encounter (Signed)
04/09/18 Faxed refill request to [employer] for Victoza Inject 1.8mg  once daily, & Novofine 32G tips to use daily with Victoza.

## 2017-04-22 ENCOUNTER — Telehealth: Payer: Self-pay | Admitting: Pharmacy Technician

## 2017-04-22 NOTE — Telephone Encounter (Signed)
Patient approved for medication assistance at [facility] through 2018, as long as eligibility continues to be met.  Betty J. Kluttz Care Manager [facility] 

## 2017-05-04 ENCOUNTER — Telehealth: Payer: Self-pay | Admitting: Pharmacist

## 2017-05-04 NOTE — Telephone Encounter (Signed)
05/04/17 Faxed [employer] application to process for Enbrel 50mg  SureClick once a week, diagnosis code M05.79, provider that signed forms Dr. 06-20-1975 @ [facility]. This medication is usually shipped to patient, we have requested Ashley Cochran let [facility] know when Ashley Cochran receives and bring [place] paperwork for our records.

## 2017-05-26 ENCOUNTER — Telehealth: Payer: Self-pay | Admitting: Pharmacist

## 2017-05-26 NOTE — Telephone Encounter (Signed)
05/26/17 Placed refill online with [employer] for Advair 250/50 & Ventolin HFA, to release 06/24/17, order# [id number].

## 2017-06-08 ENCOUNTER — Ambulatory Visit: Payer: Self-pay | Admitting: Endocrinology

## 2017-06-08 DIAGNOSIS — E119 Type 2 diabetes mellitus without complications: Secondary | ICD-10-CM

## 2017-06-08 MED ORDER — LEVOTHYROXINE SODIUM 125 MCG PO TABS: 125.0000 ug | ORAL_TABLET | Freq: Every day | ORAL | 4 refills | Status: DC

## 2017-06-08 MED ORDER — LEVOTHYROXINE SODIUM 175 MCG PO TABS: 175.0000 ug | ORAL_TABLET | Freq: Every day | ORAL | 4 refills | Status: DC

## 2017-06-08 NOTE — Progress Notes (Signed)
Patient's primary care provider: Zachery Dauer, FNP   Ashley Cochran is a 41 year old female returning for follow-up of T2DM with an A1c of 5.9 tonight (June 19) down from a 6.1 in March 22.   Complains of loss of balance a few times a day a couple of days a week. Denies vertigo or any other precipitating symptoms. Started approximately 3-4 weeks ago. Acknowledges some chest pain and heart palpitations that are "flutters". Followed by sharp pain and some "tingling" in the R arm. In the past month has experienced this 2-3 times. Was a smoker approximately 2-packs/week and 10-12 years. History of heart disease the family (mother with TIAs and father had angina). Reports some heart defects in siblings ("hole in heart").   Menstruating regularly with the same timing but seems heavier and longer than it has previously been. Thinks she may be starting menopause.    Other problems include:  Patient Active Problem List   Diagnosis Date Noted   Nephrolithiasis 08/19/2016   Diabetes (HCC) 02/11/2016   Syncope, near 10/22/2015   Hypothyroidism 04/09/2015   Rheumatoid arthritis (HCC) 04/09/2015    Her current medications include: Current Outpatient Prescriptions  Medication Sig Dispense Refill   albuterol (PROVENTIL HFA;VENTOLIN HFA) 108 (90 Base) MCG/ACT inhaler Inhale 2 puffs into the lungs every 6 (six) hours as needed for wheezing or shortness of breath. 3 Inhaler 4   aspirin-acetaminophen-caffeine (EXCEDRIN MIGRAINE) 250-250-65 MG tablet Take by mouth.     baclofen (LIORESAL) 20 MG tablet Take 20 mg by mouth.     cyclobenzaprine (FLEXERIL) 10 MG tablet Take 10 mg by mouth as needed for muscle spasms (every 8 hours as needed for spasms.).     Etanercept 50 MG/ML SOCT Inject 1 Dose into the skin once a week.     Fluticasone-Salmeterol (ADVAIR DISKUS) 250-50 MCG/DOSE AEPB Inhale 1 puff into the lungs 2 (two) times daily. 180 each 4   hydroxychloroquine (PLAQUENIL) 200 MG tablet Take  200 mg by mouth 2 (two) times daily.     levothyroxine (SYNTHROID, LEVOTHROID) 125 MCG tablet Take 1 tablet (125 mcg total) by mouth at bedtime. Sunday, Tuesday, Wednesday, Friday, Saturday 60 tablet 4   levothyroxine (SYNTHROID, LEVOTHROID) 175 MCG tablet Take 1 tablet (175 mcg total) by mouth at bedtime. Monday and Thursday 30 tablet 4   liraglutide (VICTOZA) 18 MG/3ML SOPN Inject 0.3 mLs (1.8 mg total) into the skin daily. 36 mL 3   montelukast (SINGULAIR) 10 MG tablet Take 1 tablet (10 mg total) by mouth daily. 90 tablet 4   naproxen (NAPROSYN) 500 MG tablet Take 0.5 tablets (250 mg total) by mouth 3 (three) times daily as needed. 200 tablet 4   omeprazole (PRILOSEC) 20 MG capsule Take 1 capsule (20 mg total) by mouth daily. 90 capsule 4   ondansetron (ZOFRAN) 4 MG tablet Take 1 tablet (4 mg total) by mouth 2 (two) times daily. 20 tablet 1   OVER THE COUNTER MEDICATION Apply 1 application topically daily. Triderma MD     traZODone (DESYREL) 50 MG tablet Take 1 tablet (50 mg total) by mouth at bedtime. 90 tablet 4   triamcinolone (NASACORT) 55 MCG/ACT AERO nasal inhaler Place 2 sprays into the nose daily. 3 Inhaler 4   No current facility-administered medications for this visit.      Interim history:  T2DM for over 10 years.    Exam:  Weight: 265.4 Blood Pressure: 118/71 Heart Rate: 86 Blood Glucose: 168  Constitutional: well appearing, Alert,  oriented, in NAD Eyes: EOMI, lid lag none, fundus normal with sharp discs, no retinopathy seen Cardiovascular: Regular rhythm, no murmurs, gallops, rubs. Peripheral pulses 2+ Respiratory: Lungs clear, some end-expiratory wheezes, no rales Skin: no rashes or lesions. Feet without lesions Neurologic: Gait normal, DTRs normal. light touch sensation normal in feet in 5/5 spots bilaterally by monofilament Psychiatric: Oriented, normal mood and affect Heme/lymph/immunologic: no lymphadenopathy   Recent labs:  A1c was 5.9 down from a  6.1 on March 22.   Assessment and Plan:    Ashley Cochran T2DM and hypothyroid condition are both well controlled. She should continue taking the liraglutide (1.8 mg total) daily as prescribed. In addition, she should continue taking the Levothyroxine as prescribed. She no longer needs to be seen by the DMP[facility] unless her condition changes. Have recommended follow-up with Zachery Dauer, FNP to manage diabetes and hypothyroid.   Ashley Cochran was told to make a follow-up appointment with her PCP, Odem, Deeann Cree, FNP, for the heart concerns.

## 2017-06-08 NOTE — Progress Notes (Deleted)
[facility] Progress Note  06/08/2017  Name: Ashley Cochran  MRN: [id number]  Date of Birth: 1976/07/14  Chief Complaint: No chief complaint on file.   HPI: HPI  Past Medical History:  Past Medical History:  Diagnosis Date  . Arthritis    Rheumatoid Arthritis  . Asthma   . Diabetes mellitus without complication (HCC)   . GERD (gastroesophageal reflux disease)   . Kidney stones   . Thyroid disease     Past Surgical History: No past surgical history on file.  Past Family History:  Family History  Problem Relation Age of Onset  . Hypertension Mother   . Diabetes Mother        type 2  . Asthma Mother   . Stroke Mother        Mini strokes  . GER disease Mother   . Hypertension Father   . Diabetes Father        Type 2  . Asthma Father   . GER disease Father   . Asthma Sister   . Allergies Sister   . Asthma Brother   . Allergies Brother     Diabetes Management:  Lab Results  Component Value Date   HGBA1C 6.1 03/11/2017    Meter here today{question; yes no :20885}  Hypoglycemia in the last 3 months{question; yes no :20885}  Highest blood sugar seen in last 1-2 months? ***  Reported blood sugar average:  Trouble with managing blood sugar :  New Complaints: ***  Personal Diabetes Goal: ***  {[id number]}  Clinical Assessment & Plan  ***

## 2017-06-10 ENCOUNTER — Ambulatory Visit: Payer: Self-pay | Admitting: Urology

## 2017-06-10 VITALS — BP 125/68 | HR 81 | Temp 98.2°F | Wt 265.7 lb

## 2017-06-10 DIAGNOSIS — R079 Chest pain, unspecified: Secondary | ICD-10-CM

## 2017-06-10 DIAGNOSIS — E119 Type 2 diabetes mellitus without complications: Secondary | ICD-10-CM

## 2017-06-10 LAB — GLUCOSE, POCT (MANUAL RESULT ENTRY): POC GLUCOSE: 124 mg/dL — AB (ref 70–99)

## 2017-06-10 NOTE — Progress Notes (Signed)
Patient: Ashley Cochran Female    DOB: 07-08-1976   41 y.o.   MRN: [id number] Visit Date: 06/10/2017  Today's Provider: [facility]   Chief Complaint  Patient presents with  . Chest Pain  . Dizziness   Subjective:    HPI 41 yo WF with chest pain and dizziness.  She states she has seen several doctors and they didn't seem concerned.  The chest pain is located across the sternum.  The pain first occurred one year ago.  It occurred two times.  It hasn't happened until two months ago.  The pain occurs with activity.  SOB occurs with the pain on occasion.  Stress also seems to make it worse.  Nothing seems to make it better.  Pain lasts for a few minutes.  Pain is 8-9/10.   She has right shoulder numbness and pain with the chest pain and sometimes without.  It is not associated with eating.  She gets cold and clammy with the chest pain.    She denies any cardiac history, but she states that she has a strong family history of cardiac disease.     Reviewing labs TSH normal, HgbA1c normal,   She also has been having episodes of losing her balance.  She states that she can be standing and start to fall back.  She has ran into the wall on occasion.  It is not associated with syncope.  She has not had visual changes.  She denies ringing in her ears.       Allergies  Allergen Reactions  . Nitrofuran Derivatives Rash    jittery  . Latex   . Strawberry Flavor   . Tape Rash   Previous Medications   ALBUTEROL (PROVENTIL HFA;VENTOLIN HFA) 108 (90 BASE) MCG/ACT INHALER    Inhale 2 puffs into the lungs every 6 (six) hours as needed for wheezing or shortness of breath.   ASPIRIN-ACETAMINOPHEN-CAFFEINE (EXCEDRIN MIGRAINE) 250-250-65 MG TABLET    Take by mouth.   BACLOFEN (LIORESAL) 20 MG TABLET    Take 20 mg by mouth.   CYCLOBENZAPRINE (FLEXERIL) 10 MG TABLET    Take 10 mg by mouth as needed for muscle spasms (every 8 hours as needed for spasms.).   ETANERCEPT 50 MG/ML SOCT    Inject 1  Dose into the skin once a week.   FLUTICASONE-SALMETEROL (ADVAIR DISKUS) 250-50 MCG/DOSE AEPB    Inhale 1 puff into the lungs 2 (two) times daily.   HYDROXYCHLOROQUINE (PLAQUENIL) 200 MG TABLET    Take 200 mg by mouth 2 (two) times daily.   LEVOTHYROXINE (SYNTHROID, LEVOTHROID) 125 MCG TABLET    Take 1 tablet (125 mcg total) by mouth at bedtime. Sunday, Tuesday, Wednesday, Friday, Saturday   LEVOTHYROXINE (SYNTHROID, LEVOTHROID) 175 MCG TABLET    Take 1 tablet (175 mcg total) by mouth at bedtime. Monday and Thursday   LIRAGLUTIDE (VICTOZA) 18 MG/3ML SOPN    Inject 0.3 mLs (1.8 mg total) into the skin daily.   MONTELUKAST (SINGULAIR) 10 MG TABLET    Take 1 tablet (10 mg total) by mouth daily.   NAPROXEN (NAPROSYN) 500 MG TABLET    Take 0.5 tablets (250 mg total) by mouth 3 (three) times daily as needed.   OMEPRAZOLE (PRILOSEC) 20 MG CAPSULE    Take 1 capsule (20 mg total) by mouth daily.   ONDANSETRON (ZOFRAN) 4 MG TABLET    Take 1 tablet (4 mg total) by mouth 2 (two) times daily.   OVER THE COUNTER  MEDICATION    Apply 1 application topically daily. Triderma MD   TRAZODONE (DESYREL) 50 MG TABLET    Take 1 tablet (50 mg total) by mouth at bedtime.   TRIAMCINOLONE (NASACORT) 55 MCG/ACT AERO NASAL INHALER    Place 2 sprays into the nose daily.    Review of Systems  Social History  Substance Use Topics  . Smoking status: Former Smoker    Quit date: 09/20/2004  . Smokeless tobacco: Never Used  . Alcohol use No   Objective:   BP 125/68   Pulse 81   Temp 98.2 F (36.8 C)   Wt 265 lb 11.2 oz (120.5 kg)   LMP 06/01/2017   BMI 42.89 kg/m   Physical Exam Constitutional: Well nourished. Alert and oriented, No acute distress. HEENT: [place] AT, moist mucus membranes. Trachea midline, no masses. Cardiovascular: No clubbing, cyanosis, or edema. Respiratory: Normal respiratory effort, no increased work of breathing. GI: Abdomen is soft, non tender, non distended, no abdominal masses.  Skin: No  rashes, bruises or suspicious lesions. Lymph: No cervical or inguinal adenopathy. Neurologic: Grossly intact, no focal deficits, moving all 4 extremities. Psychiatric: Normal mood and affect.      Assessment & Plan:    1. Chest pain  - will refer to cardiology - she states she had charity care at [facility] ENCOURAGED PATIENT SEEK CARE IN ED WHEN HAVING CHEST PAIN  2. Right arm pain  - appointment with Dr. Justice Rocher  3. Loss of balance  - ? Orthopedic vs neurology   - appointment with Dr.Fossier to evaluate for possible orthopedic eitology          [facility]   [facility]

## 2017-06-11 ENCOUNTER — Telehealth: Payer: Self-pay

## 2017-06-11 NOTE — Telephone Encounter (Signed)
Referral sent to [facility] cardiology.

## 2017-06-17 ENCOUNTER — Ambulatory Visit: Payer: Self-pay

## 2017-06-30 ENCOUNTER — Other Ambulatory Visit: Payer: Self-pay

## 2017-06-30 DIAGNOSIS — E119 Type 2 diabetes mellitus without complications: Secondary | ICD-10-CM

## 2017-06-30 NOTE — Progress Notes (Unsigned)
[email or web address]

## 2017-07-01 LAB — CBC WITH DIFFERENTIAL/PLATELET
BASOS ABS: 0 10*3/uL (ref 0.0–0.2)
BASOS: 0 %
EOS (ABSOLUTE): 0.2 10*3/uL (ref 0.0–0.4)
Eos: 2 %
Hematocrit: 36.3 % (ref 34.0–46.6)
Hemoglobin: 11.8 g/dL (ref 11.1–15.9)
IMMATURE GRANS (ABS): 0 10*3/uL (ref 0.0–0.1)
Immature Granulocytes: 0 %
LYMPHS: 30 %
Lymphocytes Absolute: 3 10*3/uL (ref 0.7–3.1)
MCH: 26.3 pg — ABNORMAL LOW (ref 26.6–33.0)
MCHC: 32.5 g/dL (ref 31.5–35.7)
MCV: 81 fL (ref 79–97)
Monocytes Absolute: 0.6 10*3/uL (ref 0.1–0.9)
Monocytes: 6 %
NEUTROS PCT: 62 %
Neutrophils Absolute: 6.2 10*3/uL (ref 1.4–7.0)
PLATELETS: 285 10*3/uL (ref 150–379)
RBC: 4.49 x10E6/uL (ref 3.77–5.28)
RDW: 14.6 % (ref 12.3–15.4)
WBC: 10 10*3/uL (ref 3.4–10.8)

## 2017-07-01 LAB — LIPID PANEL
CHOLESTEROL TOTAL: 133 mg/dL (ref 100–199)
Chol/HDL Ratio: 2.8 ratio (ref 0.0–4.4)
HDL: 47 mg/dL (ref >39)
LDL Calculated: 72 mg/dL (ref 0–99)
TRIGLYCERIDES: 69 mg/dL (ref 0–149)
VLDL Cholesterol Cal: 14 mg/dL (ref 5–40)

## 2017-07-01 LAB — COMPREHENSIVE METABOLIC PANEL
A/G RATIO: 1.1 — AB (ref 1.2–2.2)
ALT: 14 IU/L (ref 0–32)
AST: 14 IU/L (ref 0–40)
Albumin: 3.7 g/dL (ref 3.5–5.5)
Alkaline Phosphatase: 96 IU/L (ref 39–117)
BUN/Creatinine Ratio: 17 (ref 9–23)
BUN: 11 mg/dL (ref 6–24)
Bilirubin Total: 0.4 mg/dL (ref 0.0–1.2)
CALCIUM: 8.8 mg/dL (ref 8.7–10.2)
CHLORIDE: 102 mmol/L (ref 96–106)
CO2: 24 mmol/L (ref 20–29)
Creatinine, Ser: 0.63 mg/dL (ref 0.57–1.00)
GFR calc Af Amer: 128 mL/min/{1.73_m2} (ref >59)
GFR, EST NON AFRICAN AMERICAN: 111 mL/min/{1.73_m2} (ref >59)
Globulin, Total: 3.3 g/dL (ref 1.5–4.5)
Glucose: 110 mg/dL — ABNORMAL HIGH (ref 65–99)
POTASSIUM: 4.4 mmol/L (ref 3.5–5.2)
Sodium: 139 mmol/L (ref 134–144)
Total Protein: 7 g/dL (ref 6.0–8.5)

## 2017-07-01 LAB — HEMOGLOBIN A1C
Est. average glucose Bld gHb Est-mCnc: 131 mg/dL
HEMOGLOBIN A1C: 6.2 % — AB (ref 4.8–5.6)

## 2017-07-01 LAB — TSH: TSH: 4.13 u[IU]/mL (ref 0.450–4.500)

## 2017-07-07 ENCOUNTER — Ambulatory Visit: Payer: Self-pay | Admitting: Specialist

## 2017-07-14 ENCOUNTER — Telehealth: Payer: Self-pay | Admitting: Nurse Practitioner

## 2017-07-14 ENCOUNTER — Ambulatory Visit: Payer: Self-pay | Admitting: Internal Medicine

## 2017-07-14 ENCOUNTER — Encounter: Payer: Self-pay | Admitting: Internal Medicine

## 2017-07-14 VITALS — BP 119/72 | HR 73 | Temp 98.1°F | Wt 269.8 lb

## 2017-07-14 DIAGNOSIS — E119 Type 2 diabetes mellitus without complications: Secondary | ICD-10-CM

## 2017-07-14 DIAGNOSIS — J4540 Moderate persistent asthma, uncomplicated: Secondary | ICD-10-CM

## 2017-07-14 LAB — GLUCOSE, POCT (MANUAL RESULT ENTRY): POC Glucose: 121 mg/dl — AB (ref 70–99)

## 2017-07-14 MED ORDER — MONTELUKAST SODIUM 10 MG PO TABS: 10.0000 mg | ORAL_TABLET | Freq: Every day | ORAL | 4 refills | Status: DC

## 2017-07-14 NOTE — Telephone Encounter (Signed)
Called to confirm appointment.

## 2017-07-14 NOTE — Patient Instructions (Addendum)
Patient to follow up in 6 months with labs prior to visit. A1C, MetC, Lipids, CBC, Sed Rate, UA and Uric Acid

## 2017-07-14 NOTE — Progress Notes (Signed)
Subjective:    Patient ID: Ashley Cochran, female    DOB: [birth year], 41 y.o.   MRN: [id number]  HPI  Patient Active Problem List   Diagnosis Date Noted  . Nephrolithiasis 08/19/2016  . Diabetes (HCC) 02/11/2016  . Syncope, near 10/22/2015  . Hypothyroidism 04/09/2015  . Rheumatoid arthritis (HCC) 04/09/2015   Patient states she is seen at [facility] for her Rheumatoid arthritis. Patient was diagnosed with asthma years ago.  She uses her inhalers daily except for the rescue inhaler.   Review of Systems Patient suffers with kidney stones, fatty liver.  Labs are normal    Objective:   Physical Exam  Constitutional: She is oriented to person, place, and time.  Cardiovascular: Normal rate and regular rhythm.   Pulmonary/Chest: Effort normal and breath sounds normal.  Neurological: She is alert and oriented to person, place, and time.    BP 119/72 (BP Location: Left Arm)   Pulse 73   Temp 98.1 F (36.7 C)   Wt 269 lb 12.8 oz (122.4 kg)   LMP 06/14/2017   BMI 43.55 kg/m  Allergies as of 07/14/2017      Reactions   Nitrofuran Derivatives Rash   jittery   Latex    Strawberry Flavor    Tape Rash      Medication List       Accurate as of 07/14/17 10:28 AM. Always use your most recent med list.          albuterol 108 (90 Base) MCG/ACT inhaler Commonly known as:  PROVENTIL HFA;VENTOLIN HFA Inhale 2 puffs into the lungs every 6 (six) hours as needed for wheezing or shortness of breath.   aspirin-acetaminophen-caffeine 250-250-65 MG tablet Commonly known as:  EXCEDRIN MIGRAINE Take by mouth.   baclofen 20 MG tablet Commonly known as:  LIORESAL Take 20 mg by mouth.   cyclobenzaprine 10 MG tablet Commonly known as:  FLEXERIL Take 10 mg by mouth as needed for muscle spasms (every 8 hours as needed for spasms.).   Etanercept 50 MG/ML Soct Inject 1 Dose into the skin once a week.   Fluticasone-Salmeterol 250-50 MCG/DOSE Aepb Commonly known as:  ADVAIR  DISKUS Inhale 1 puff into the lungs 2 (two) times daily.   hydroxychloroquine 200 MG tablet Commonly known as:  PLAQUENIL Take 200 mg by mouth 2 (two) times daily.   levothyroxine 175 MCG tablet Commonly known as:  SYNTHROID, LEVOTHROID Take 1 tablet (175 mcg total) by mouth at bedtime. Monday and Thursday   levothyroxine 125 MCG tablet Commonly known as:  SYNTHROID, LEVOTHROID Take 1 tablet (125 mcg total) by mouth at bedtime. Sunday, Tuesday, Wednesday, Friday, Saturday   liraglutide 18 MG/3ML Sopn Commonly known as:  VICTOZA Inject 0.3 mLs (1.8 mg total) into the skin daily.   montelukast 10 MG tablet Commonly known as:  SINGULAIR Take 1 tablet (10 mg total) by mouth daily.   naproxen 500 MG tablet Commonly known as:  NAPROSYN Take 0.5 tablets (250 mg total) by mouth 3 (three) times daily as needed.   omeprazole 20 MG capsule Commonly known as:  PRILOSEC Take 1 capsule (20 mg total) by mouth daily.   ondansetron 4 MG tablet Commonly known as:  ZOFRAN Take 1 tablet (4 mg total) by mouth 2 (two) times daily.   OVER THE COUNTER MEDICATION Apply 1 application topically daily. Triderma MD   traZODone 50 MG tablet Commonly known as:  DESYREL Take 1 tablet (50 mg total) by mouth at bedtime.  triamcinolone 55 MCG/ACT Aero nasal inhaler Commonly known as:  NASACORT Place 2 sprays into the nose daily.            Assessment & Plan:  Patient to follow up in 6 months with labs prior to visit. A1C, MetC, Lipids, CBC, Sed Rate, UA and Uric Acid

## 2017-08-04 ENCOUNTER — Ambulatory Visit: Payer: Self-pay | Admitting: Specialist

## 2017-08-04 DIAGNOSIS — M069 Rheumatoid arthritis, unspecified: Secondary | ICD-10-CM

## 2017-08-04 NOTE — Progress Notes (Signed)
   Subjective:    Patient ID: Ashley Cochran, female    DOB: January 09, 1976, 41 y.o.   MRN: [id number]  HPI   Referred for possible orthopedic problem there is none.    Review of Systems     Objective:   Physical Exam Gait, heel toe walk tandem gait WNL. RHOMBERG not present propriocepion WNL        Assessment & Plan:  Agree with referral to Neurology. Patient does not need to return.

## 2017-08-05 ENCOUNTER — Telehealth: Payer: Self-pay

## 2017-08-05 NOTE — Telephone Encounter (Signed)
Placed signed application/script in [facility] folder for pickup. 

## 2017-08-05 NOTE — Telephone Encounter (Signed)
Received PAP application from [facility] for Victoza placed for provider to sign. 

## 2017-08-09 ENCOUNTER — Telehealth: Payer: Self-pay | Admitting: Pharmacist

## 2017-08-09 ENCOUNTER — Ambulatory Visit: Admission: RE | Admit: 2017-08-09 | Discharge: 2017-08-09 | Disposition: A | Discharge status: Home / Self Care

## 2017-08-09 DIAGNOSIS — M0579 Rheumatoid arthritis with rheumatoid factor of multiple sites without organ or systems involvement: Principal | ICD-10-CM

## 2017-08-09 NOTE — Telephone Encounter (Signed)
08/09/17 Faxed refill request to [employer] for [employer] 1.8mg  once daily & Novofine 32G tips.[name]

## 2017-08-25 ENCOUNTER — Telehealth: Payer: Self-pay | Admitting: Pharmacist

## 2017-08-25 NOTE — Telephone Encounter (Signed)
08/25/17 Placed refill online with [employer] for Ventolin HFA & Advair 250/50-to release 09/07/17, order# [id number].[name]

## 2017-10-22 ENCOUNTER — Telehealth: Payer: Self-pay | Admitting: Pharmacist

## 2017-10-22 NOTE — Telephone Encounter (Signed)
10/22/17 Placed refills online with [facility] for Advair 250/50 & Ventolin HFA, to release 11/19/17, order# [id number].[name]

## 2017-12-09 ENCOUNTER — Telehealth: Payer: Self-pay | Admitting: Pharmacist

## 2017-12-09 NOTE — Telephone Encounter (Signed)
12/09/17 Faxed [facility] refill request for Victoza Injec 1.8 mg once daily,#4 & Novofine 32G tips.[name]

## 2017-12-16 ENCOUNTER — Telehealth: Payer: Self-pay | Admitting: Pharmacist

## 2017-12-16 NOTE — Telephone Encounter (Signed)
12/16/17 I had a voicemail on my phone from [employer] to call them about refill request they received for Victoza, I called spoke with Alena Bills, according to her patient enrollment expired 12/05/17, they need a Renewal application and proof of income on patient. I have printed, mailing patient her portion to sign & return with 1 month of current income & sending to [facility] for provider to sign.[name]

## 2017-12-29 ENCOUNTER — Telehealth: Payer: Self-pay | Admitting: Pharmacist

## 2017-12-29 NOTE — Telephone Encounter (Signed)
12/29/17 Patient has returned her portion of application, signed, also returned Dec 2018 income. I am holding for provider to return their portion.[name]

## 2018-01-03 ENCOUNTER — Telehealth: Payer: Self-pay | Admitting: Pharmacist

## 2018-01-03 NOTE — Telephone Encounter (Signed)
01/03/18 Faxed Novo Nordisk application for renewal-Victoza pens Inject 1.8mg  once daily # 4 boxes & Novofine 32G tips use daily with Victoza pens.[name]

## 2018-01-10 ENCOUNTER — Telehealth: Payer: Self-pay | Admitting: Pharmacist

## 2018-01-10 NOTE — Telephone Encounter (Signed)
01/10/2018 I called patient to check on her supply of Enbrel, she stated she has 2 left, has an upcoming appointment with the provider that prescribes and she stated she needs to reapply within the next couple of months. I told patient since she has upcoming appointment I would print off application, she could come by our office to pick up, I will have her portion for her to sign and the provider portion for patient to take to appointment and return to [place]. She has been ordering her refills, I did remind patient to bring [place] paperwork as she receives the med, she agreed. Also she stated she had some extra due to did not take during time of having some dental work done. [name]

## 2018-01-12 ENCOUNTER — Other Ambulatory Visit: Payer: Self-pay

## 2018-01-12 DIAGNOSIS — M069 Rheumatoid arthritis, unspecified: Secondary | ICD-10-CM

## 2018-01-12 DIAGNOSIS — J4540 Moderate persistent asthma, uncomplicated: Secondary | ICD-10-CM

## 2018-01-12 DIAGNOSIS — E119 Type 2 diabetes mellitus without complications: Secondary | ICD-10-CM

## 2018-01-13 LAB — CBC WITH DIFFERENTIAL
BASOS ABS: 0 10*3/uL (ref 0.0–0.2)
BASOS: 0 %
EOS (ABSOLUTE): 0.3 10*3/uL (ref 0.0–0.4)
Eos: 3 %
HEMOGLOBIN: 12.5 g/dL (ref 11.1–15.9)
Hematocrit: 37.7 % (ref 34.0–46.6)
IMMATURE GRANS (ABS): 0 10*3/uL (ref 0.0–0.1)
Immature Granulocytes: 0 %
LYMPHS: 32 %
Lymphocytes Absolute: 3.4 10*3/uL — ABNORMAL HIGH (ref 0.7–3.1)
MCH: 26.9 pg (ref 26.6–33.0)
MCHC: 33.2 g/dL (ref 31.5–35.7)
MCV: 81 fL (ref 79–97)
MONOCYTES: 7 %
Monocytes Absolute: 0.8 10*3/uL (ref 0.1–0.9)
NEUTROS ABS: 6.1 10*3/uL (ref 1.4–7.0)
Neutrophils: 58 %
RBC: 4.65 x10E6/uL (ref 3.77–5.28)
RDW: 14.7 % (ref 12.3–15.4)
WBC: 10.6 10*3/uL (ref 3.4–10.8)

## 2018-01-13 LAB — HEMOGLOBIN A1C
Est. average glucose Bld gHb Est-mCnc: 148 mg/dL
HEMOGLOBIN A1C: 6.8 % — AB (ref 4.8–5.6)

## 2018-01-13 LAB — COMPREHENSIVE METABOLIC PANEL
A/G RATIO: 1 — AB (ref 1.2–2.2)
ALBUMIN: 3.7 g/dL (ref 3.5–5.5)
ALK PHOS: 98 IU/L (ref 39–117)
ALT: 19 IU/L (ref 0–32)
AST: 12 IU/L (ref 0–40)
BILIRUBIN TOTAL: 0.2 mg/dL (ref 0.0–1.2)
BUN / CREAT RATIO: 19 (ref 9–23)
BUN: 11 mg/dL (ref 6–24)
CHLORIDE: 99 mmol/L (ref 96–106)
CO2: 22 mmol/L (ref 20–29)
Calcium: 8.9 mg/dL (ref 8.7–10.2)
Creatinine, Ser: 0.58 mg/dL (ref 0.57–1.00)
GFR calc non Af Amer: 113 mL/min/{1.73_m2} (ref >59)
GFR, EST AFRICAN AMERICAN: 131 mL/min/{1.73_m2} (ref >59)
GLOBULIN, TOTAL: 3.8 g/dL (ref 1.5–4.5)
GLUCOSE: 192 mg/dL — AB (ref 65–99)
POTASSIUM: 4.7 mmol/L (ref 3.5–5.2)
SODIUM: 136 mmol/L (ref 134–144)
Total Protein: 7.5 g/dL (ref 6.0–8.5)

## 2018-01-13 LAB — URIC ACID: Uric Acid: 3.6 mg/dL (ref 2.5–7.1)

## 2018-01-13 LAB — URINALYSIS
Bilirubin, UA: NEGATIVE
Glucose, UA: NEGATIVE
Ketones, UA: NEGATIVE
Leukocytes, UA: NEGATIVE
NITRITE UA: NEGATIVE
Protein, UA: NEGATIVE
RBC, UA: NEGATIVE
Specific Gravity, UA: 1.027 (ref 1.005–1.030)
UUROB: 0.2 mg/dL (ref 0.2–1.0)
pH, UA: 5.5 (ref 5.0–7.5)

## 2018-01-13 LAB — LIPID PANEL
CHOLESTEROL TOTAL: 154 mg/dL (ref 100–199)
Chol/HDL Ratio: 3.9 ratio (ref 0.0–4.4)
HDL: 39 mg/dL — AB (ref >39)
LDL CALC: 89 mg/dL (ref 0–99)
Triglycerides: 129 mg/dL (ref 0–149)
VLDL Cholesterol Cal: 26 mg/dL (ref 5–40)

## 2018-01-13 LAB — SEDIMENTATION RATE: Sed Rate: 55 mm/hr — ABNORMAL HIGH (ref 0–32)

## 2018-01-19 ENCOUNTER — Telehealth: Payer: Self-pay | Admitting: Pharmacist

## 2018-01-19 ENCOUNTER — Ambulatory Visit: Payer: Self-pay | Admitting: Internal Medicine

## 2018-01-19 NOTE — Telephone Encounter (Signed)
01/19/18 Printed scripts for Advair 250/50 & Ventolin HFA & sending to [facility] for refill.[name]

## 2018-01-20 ENCOUNTER — Telehealth: Payer: Self-pay

## 2018-01-20 NOTE — Telephone Encounter (Signed)
Pt would like to r/s appt. No answer and no voicemail set up

## 2018-02-02 ENCOUNTER — Telehealth: Payer: Self-pay | Admitting: Pharmacist

## 2018-02-02 NOTE — Telephone Encounter (Signed)
02/02/18 Received letter from [employer] stating patient enrollment ends 03/28/18-Ventolin HFA & Advair 250/50. Printed application mailing patient her portion to sign & return, sending scripts to [facility] for Dr. Candelaria Stagers to sign.

## 2018-02-17 ENCOUNTER — Other Ambulatory Visit: Payer: Self-pay

## 2018-02-17 ENCOUNTER — Ambulatory Visit: Payer: Self-pay | Admitting: Pharmacy Technician

## 2018-02-17 ENCOUNTER — Encounter (INDEPENDENT_AMBULATORY_CARE_PROVIDER_SITE_OTHER): Payer: Self-pay

## 2018-02-17 ENCOUNTER — Encounter: Payer: Self-pay | Admitting: Pharmacist

## 2018-02-17 ENCOUNTER — Ambulatory Visit: Payer: Self-pay | Admitting: Pharmacist

## 2018-02-17 VITALS — BP 122/74

## 2018-02-17 DIAGNOSIS — Z79899 Other long term (current) drug therapy: Secondary | ICD-10-CM

## 2018-02-17 DIAGNOSIS — Z8719 Personal history of other diseases of the digestive system: Secondary | ICD-10-CM

## 2018-02-17 MED ORDER — OMEPRAZOLE 20 MG PO CPDR: 20.0000 mg | DELAYED_RELEASE_CAPSULE | Freq: Every day | ORAL | 0 refills | Status: DC

## 2018-02-17 NOTE — Progress Notes (Addendum)
[facility] Visit Note  Patient: Ashley Cochran MRN: [id number] Date of Birth: Sep 15, 1976 PCP: Zachery Dauer, FNP   [name] 42 y.o. female presents for a 1 year follow up MTM visit at [facility] today.  Patient Information   Past Medical History:  Diagnosis Date  . Arthritis    Rheumatoid Arthritis  . Asthma   . Diabetes mellitus without complication (HCC)   . GERD (gastroesophageal reflux disease)   . Kidney stones   . Thyroid disease      No past surgical history on file.   Family History  Problem Relation Age of Onset  . Hypertension Mother   . Diabetes Mother        type 2  . Asthma Mother   . Stroke Mother        Mini strokes  . GER disease Mother   . Hypertension Father   . Diabetes Father        Type 2  . Asthma Father   . GER disease Father   . Asthma Sister   . Allergies Sister   . Asthma Brother   . Allergies Brother     Lifestyle Diet: Per patient, diet has been "horrible" Breakfast:1 eggs, bacon Lunch:crackers lunchables, taco salad (meat/cheese/lettuce/salsa) Dinner:Fried potatoes, fried eggs, pasta  Drinks:1 small pepsi/day (down from 1 liter pepsi/day), water, lemonade (2/3lemondae + 1/3water)   Patient states her diet is hard to control because of taking care of 3-4 other family members who all are picky eaters--only eat potatoes and meat. Talked about continuing to cut down on soda each day. Suggested drinking half a soda/day and eventually get to 1/2 soda every other day. She said it's the sweetness she craves and agreed to explore other sugarfree drinks that she likes.     Social History   Substance and Sexual Activity  Alcohol Use No      Social History   Tobacco Use  Smoking Status Former Smoker  . Last attempt to quit: 09/20/2004  . Years since quitting: 13.4  Smokeless Tobacco Never Used      Health Maintenance  Topic Date Due  . PNEUMOCOCCAL POLYSACCHARIDE VACCINE (1) 12/07/1976   . FOOT EXAM  12/07/1984  . OPHTHALMOLOGY EXAM  12/07/1984  . HIV Screening  12/07/1989  . TETANUS/TDAP  12/07/1993  . PAP SMEAR  12/08/1995  . INFLUENZA VACCINE  07/21/2017  . URINE MICROALBUMIN  03/09/2018  . HEMOGLOBIN A1C  07/12/2018     Assessment and Plan:  1. Compliance: Poor; states she takes care of 4 other family members medications in addition to 2 dogs medications. Provided patient with pill box and was very grateful and said it should help her remember her meds. Admits to forgetting to take her medications ~2-3x/week.   2. Type II Diabetes: Victoza. Controlled, A1c 6.8% (01/12/18), although has increased from 6.2 (06/2017). She says she has been bad about taking her Victoza and has recently started taking it every day. Discussed healthy diet and exercise. Will try and work on cutting back even more on soda consumption. Does not check sugars unless she feels bad, which hasn't been for a long time--typically when she feels bad and checks sugars they are in the 90-100 range.    3. Asthma: recently been using albuterol inhaler once/day before work. States work is really dusty. Only uses Advair when she feels like it and I discussed importance of using Advair 1 puff twice daily for chronic treatment. Agrees to start using  on daily basis for asthma improvement.    4. RA/Psoriasis: Enbrel. Experiencing more flares than usual, especially on face. Contributed to dry/cold weather. Has been using Triderma cream on face. Patient says she has her 1 year follow up appointment with doctor this Monday.   5. GERD: Patient says acid reflux has been bothering her more recently. Continuing to take omeprazole, has about 1 week left, but would like a refill. No refills left on file-will contact [facility] for Rx until patient is able to be seen at [facility].   6. Hypothyroidism: TSH 06/30/2017 4.13 -  Takes levothyroxine on Sun, Tue, Wed,Fri, Sat, and two days a week on Mon and Thur.   Will see  patient back for follow up MTM in 1 year.   Cleopatra Cedar, PharmD Pharmacy Resident

## 2018-02-17 NOTE — Progress Notes (Signed)
  Completed [facility] application for recertification.  Patient to provide the past month's checking account statement, 401 K statement and 2018 taxes.    Sherilyn Dacosta Care Manager [facility]

## 2018-02-21 ENCOUNTER — Ambulatory Visit: Admit: 2018-02-21 | Discharge: 2018-02-21

## 2018-02-21 DIAGNOSIS — M0579 Rheumatoid arthritis with rheumatoid factor of multiple sites without organ or systems involvement: Principal | ICD-10-CM

## 2018-02-23 ENCOUNTER — Telehealth: Payer: Self-pay | Admitting: Pharmacist

## 2018-02-23 NOTE — Telephone Encounter (Signed)
02/23/2018 2:18:58 PM - Advair 250/50 & Ventolin HFA refills  02/23/18 Faxed scripts to [facility] for refills on Advair 250/50 Inhale 1 puff two times a day, rinse mouth & spit after each use, also Ventolin HFA Inhale 2 puffs into the lungs every six hours as needed for wheezing or shortness of breath. [name]

## 2018-03-04 ENCOUNTER — Telehealth: Payer: Self-pay | Admitting: Pharmacist

## 2018-03-04 NOTE — Telephone Encounter (Signed)
03/04/2018 10:28:43 AM - [employer] renewal for Ventolin & Advair  03/04/18 I have received patient signed portion of [employer] application back for renewal, I have faxed to [facility]. I have also printed scripts for Ventolin HFA Inhale 2 puffs into the lungs every six hours as needed for wheezing or shortness of breath, also Advair 250/50 Inhale 1 puff two times a day, rinse mouth & spit after each use. [facility] will need these.[name]

## 2018-03-07 ENCOUNTER — Telehealth: Payer: Self-pay | Admitting: Pharmacist

## 2018-03-07 NOTE — Telephone Encounter (Signed)
03/07/2018 2:05:47 PM - Enbrel/[employer] renewal  03/07/18 Faxed [employer] application for renewal on Enbrel 50mg  Sure Click Inject once a week, ICD10 code M05.79. 

## 2018-03-18 ENCOUNTER — Telehealth: Payer: Self-pay | Admitting: Pharmacist

## 2018-03-18 NOTE — Telephone Encounter (Signed)
03/18/2018 10:43:05 AM - Advair 250/50 & Ventolin HFA scripts  03/18/18 Faxed scripts to [employer] for Advair 250/50 Inhale 1 puff two times a day, rinse mouth & spit after each use & Ventolin HFA Inhale 2 puffs into the lungs every six hours as needed for wheezing or shortness of breath.Ashley Cochran

## 2018-03-21 ENCOUNTER — Other Ambulatory Visit: Payer: Self-pay

## 2018-03-21 DIAGNOSIS — E039 Hypothyroidism, unspecified: Secondary | ICD-10-CM

## 2018-03-21 MED ORDER — LEVOTHYROXINE SODIUM 175 MCG PO TABS: 175.0000 ug | ORAL_TABLET | Freq: Every day | ORAL | 4 refills | Status: DC

## 2018-04-28 ENCOUNTER — Telehealth: Payer: Self-pay | Admitting: Pharmacist

## 2018-04-28 NOTE — Telephone Encounter (Signed)
04/28/2018 9:56:43 AM - Advair 250/50 & Ventolin HFA refills  04/28/18 Placed refills online with [facility] for Advair 250/50 & Ventolin HFA, to release 05/30/18, order# [id number].[name]

## 2018-07-22 ENCOUNTER — Emergency Department
Admission: EM | Admit: 2018-07-22 | Discharge: 2018-07-22 | Disposition: A | Discharge status: Home / Self Care | Payer: Self-pay | Attending: Emergency Medicine | Admitting: Emergency Medicine

## 2018-07-22 ENCOUNTER — Other Ambulatory Visit: Payer: Self-pay

## 2018-07-22 ENCOUNTER — Encounter: Payer: Self-pay | Admitting: Emergency Medicine

## 2018-07-22 DIAGNOSIS — J45909 Unspecified asthma, uncomplicated: Secondary | ICD-10-CM | POA: Insufficient documentation

## 2018-07-22 DIAGNOSIS — E119 Type 2 diabetes mellitus without complications: Secondary | ICD-10-CM | POA: Insufficient documentation

## 2018-07-22 DIAGNOSIS — Z91040 Latex allergy status: Secondary | ICD-10-CM | POA: Insufficient documentation

## 2018-07-22 DIAGNOSIS — Z3202 Encounter for pregnancy test, result negative: Secondary | ICD-10-CM | POA: Insufficient documentation

## 2018-07-22 DIAGNOSIS — H81399 Other peripheral vertigo, unspecified ear: Secondary | ICD-10-CM | POA: Insufficient documentation

## 2018-07-22 DIAGNOSIS — Z79899 Other long term (current) drug therapy: Secondary | ICD-10-CM | POA: Insufficient documentation

## 2018-07-22 DIAGNOSIS — Z87891 Personal history of nicotine dependence: Secondary | ICD-10-CM | POA: Insufficient documentation

## 2018-07-22 DIAGNOSIS — E039 Hypothyroidism, unspecified: Secondary | ICD-10-CM | POA: Insufficient documentation

## 2018-07-22 LAB — BASIC METABOLIC PANEL
ANION GAP: 6 (ref 5–15)
BUN: 11 mg/dL (ref 6–20)
CHLORIDE: 101 mmol/L (ref 98–111)
CO2: 28 mmol/L (ref 22–32)
Calcium: 8.8 mg/dL — ABNORMAL LOW (ref 8.9–10.3)
Creatinine, Ser: 0.54 mg/dL (ref 0.44–1.00)
GFR calc Af Amer: >60 mL/min (ref >60)
GFR calc non Af Amer: >60 mL/min (ref >60)
GLUCOSE: 251 mg/dL — AB (ref 70–99)
Potassium: 4.2 mmol/L (ref 3.5–5.1)
Sodium: 135 mmol/L (ref 135–145)

## 2018-07-22 LAB — URINALYSIS, COMPLETE (UACMP) WITH MICROSCOPIC
Bacteria, UA: NONE SEEN
Bilirubin Urine: NEGATIVE
GLUCOSE, UA: NEGATIVE mg/dL
HGB URINE DIPSTICK: NEGATIVE
Ketones, ur: NEGATIVE mg/dL
Leukocytes, UA: NEGATIVE
Nitrite: NEGATIVE
PH: 6 (ref 5.0–8.0)
PROTEIN: NEGATIVE mg/dL
Specific Gravity, Urine: 1.015 (ref 1.005–1.030)

## 2018-07-22 LAB — POCT PREGNANCY, URINE: Preg Test, Ur: NEGATIVE

## 2018-07-22 LAB — CBC
HCT: 37.2 % (ref 35.0–47.0)
HEMOGLOBIN: 12.7 g/dL (ref 12.0–16.0)
MCH: 27.6 pg (ref 26.0–34.0)
MCHC: 34.1 g/dL (ref 32.0–36.0)
MCV: 80.8 fL (ref 80.0–100.0)
Platelets: 252 10*3/uL (ref 150–440)
RBC: 4.6 MIL/uL (ref 3.80–5.20)
RDW: 14.4 % (ref 11.5–14.5)
WBC: 8.7 10*3/uL (ref 3.6–11.0)

## 2018-07-22 MED ORDER — MECLIZINE HCL 25 MG PO TABS: 25.0000 mg | ORAL_TABLET | Freq: Three times a day (TID) | ORAL | 0 refills | Status: DC | PRN

## 2018-07-22 MED ORDER — METOCLOPRAMIDE HCL 5 MG/ML IJ SOLN: 10.0000 mg | Freq: Once | INTRAMUSCULAR | Status: DC

## 2018-07-22 MED ORDER — METOCLOPRAMIDE HCL 10 MG PO TABS: 10.0000 mg | ORAL_TABLET | Freq: Three times a day (TID) | ORAL | 0 refills | Status: DC | PRN

## 2018-07-22 MED ORDER — METOCLOPRAMIDE HCL 5 MG/ML IJ SOLN
10.0000 mg | Freq: Once | INTRAMUSCULAR | Status: AC
  Administered 2018-07-22: 10 mg via INTRAMUSCULAR
  Filled 2018-07-22: qty 2

## 2018-07-22 MED ORDER — MECLIZINE HCL 25 MG PO TABS
25.0000 mg | ORAL_TABLET | Freq: Once | ORAL | Status: AC
  Administered 2018-07-22: 25 mg via ORAL
  Filled 2018-07-22: qty 1

## 2018-07-22 NOTE — ED Provider Notes (Addendum)
[facility] Emergency Department Provider Note ____________________________________________   First MD Initiated Contact with Patient 07/22/18 1340     (approximate)  I have reviewed the triage vital signs and the nursing notes.   HISTORY  Chief Complaint Dizziness    HPI Ashley Cochran is a 42 y.o. female with PMH as noted below who presents with dizziness, acute onset approximately 3 AM last night, described as spinning, worse with certain positions, and associated with nausea.  Patient states that symptoms have now improved.  She also reports an occipital headache and photophobia.  She denies any head trauma.  She denies any hearing loss or ringing.  No prior history of vertigo.  Past Medical History:  Diagnosis Date  . Arthritis    Rheumatoid Arthritis  . Asthma   . Diabetes mellitus without complication (HCC)   . GERD (gastroesophageal reflux disease)   . Kidney stones   . Thyroid disease     Patient Active Problem List   Diagnosis Date Noted  . Nephrolithiasis 08/19/2016  . Diabetes (HCC) 02/11/2016  . Syncope, near 10/22/2015  . Hypothyroidism 04/09/2015  . Rheumatoid arthritis (HCC) 04/09/2015    History reviewed. No pertinent surgical history.  Prior to Admission medications   Medication Sig Start Date End Date Taking? Authorizing Provider  albuterol (PROVENTIL HFA;VENTOLIN HFA) 108 (90 Base) MCG/ACT inhaler Inhale 2 puffs into the lungs every 6 (six) hours as needed for wheezing or shortness of breath. 12/08/16   Maryjo Rochester, MD  aspirin-acetaminophen-caffeine (EXCEDRIN MIGRAINE) [id number] MG tablet Take by mouth.    [provider]  baclofen (LIORESAL) 20 MG tablet Take 20 mg by mouth.    [provider]  cyclobenzaprine (FLEXERIL) 10 MG tablet Take 10 mg by mouth as needed for muscle spasms (every 8 hours as needed for spasms.).    [provider]  Etanercept 50 MG/ML SOCT Inject 1 Dose into the skin  once a week.    [provider]  Fluticasone-Salmeterol (ADVAIR DISKUS) 250-50 MCG/DOSE AEPB Inhale 1 puff into the lungs 2 (two) times daily. 12/08/16   Maryjo Rochester, MD  hydroxychloroquine (PLAQUENIL) 200 MG tablet Take 200 mg by mouth 2 (two) times daily.    [provider]  levothyroxine (SYNTHROID, LEVOTHROID) 125 MCG tablet Take 1 tablet (125 mcg total) by mouth at bedtime. Sunday, Tuesday, Wednesday, Friday, Saturday 06/08/17   Grace Bushy, MD  levothyroxine (SYNTHROID, LEVOTHROID) 175 MCG tablet Take 1 tablet (175 mcg total) by mouth at bedtime. Monday and Thursday 03/21/18   Doles-Johnson, Teah, NP  liraglutide (VICTOZA) 18 MG/3ML SOPN Inject 0.3 mLs (1.8 mg total) into the skin daily. 12/08/16   Maryjo Rochester, MD  montelukast (SINGULAIR) 10 MG tablet Take 1 tablet (10 mg total) by mouth daily. 07/14/17   Virl Axe, MD  naproxen (NAPROSYN) 500 MG tablet Take 0.5 tablets (250 mg total) by mouth 3 (three) times daily as needed. 12/08/16   Maryjo Rochester, MD  omeprazole (PRILOSEC) 20 MG capsule Take 1 capsule (20 mg total) by mouth daily. 02/17/18   Doles-Johnson, Teah, NP  ondansetron (ZOFRAN) 4 MG tablet Take 1 tablet (4 mg total) by mouth 2 (two) times daily. 12/08/16   Maryjo Rochester, MD  OVER THE COUNTER MEDICATION Apply 1 application topically daily. Triderma MD    [provider]  traZODone (DESYREL) 50 MG tablet Take 1 tablet (50 mg total) by mouth at bedtime. 12/08/16   Maryjo Rochester,  MD  triamcinolone (NASACORT) 55 MCG/ACT AERO nasal inhaler Place 2 sprays into the nose daily. 12/08/16   Maryjo Rochester, MD    Allergies Nitrofuran derivatives; Latex; Strawberry flavor; and Tape  Family History  Problem Relation Age of Onset  . Hypertension Mother   . Diabetes Mother        type 2  . Asthma Mother   . Stroke Mother        Mini strokes  . GER disease Mother   . Hypertension Father   . Diabetes Father        Type 2  . Asthma Father   . GER disease  Father   . Asthma Sister   . Allergies Sister   . Asthma Brother   . Allergies Brother     Social History Social History   Tobacco Use  . Smoking status: Former Smoker    Last attempt to quit: 09/20/2004    Years since quitting: 13.8  . Smokeless tobacco: Never Used  Substance Use Topics  . Alcohol use: No  . Drug use: No    Review of Systems  Constitutional: No fever. Eyes: No visual changes. ENT: No sore throat. Cardiovascular: Denies chest pain. Respiratory: Denies shortness of breath. Gastrointestinal: Positive for nausea. Genitourinary: Negative for dysuria.  Musculoskeletal: Negative for back pain. Skin: Negative for rash. Neurological: Positive for headache.   ____________________________________________   PHYSICAL EXAM:  VITAL SIGNS: ED Triage Vitals  Enc Vitals Group     BP 07/22/18 1214 (!) 114/51     Pulse Rate 07/22/18 1214 77     Resp 07/22/18 1214 20     Temp 07/22/18 1214 98.6 F (37 C)     Temp Source 07/22/18 1214 Oral     SpO2 07/22/18 1214 97 %     Weight 07/22/18 1216 270 lb (122.5 kg)     Height 07/22/18 1216 5\' 6"  (1.676 m)     Head Circumference --      Peak Flow --      Pain Score 07/22/18 1219 0     Pain Loc --      Pain Edu? --      Excl. in GC? --     Constitutional: Alert and oriented.  Actively well appearing and in no acute distress. Eyes: Conjunctivae are normal.  EOMI.  PERRLA. Head: Atraumatic. Nose: No congestion/rhinnorhea. Mouth/Throat: Mucous membranes are moist.   Neck: Normal range of motion.  Cardiovascular: Good peripheral circulation. Respiratory: Normal respiratory effort.  No retractions.  Gastrointestinal: No distention.  Musculoskeletal: Extremities warm and well perfused.  Neurologic:  Normal speech and language.  Motor and sensory intact in all extremities.  Cranial nerves III through XII intact.  Normal coordination with no ataxia.   Skin:  Skin is warm and dry. No rash noted. Psychiatric: Mood and  affect are normal. Speech and behavior are normal.  ____________________________________________   LABS (all labs ordered are listed, but only abnormal results are displayed)  Labs Reviewed  BASIC METABOLIC PANEL - Abnormal; Notable for the following components:      Result Value   Glucose, Bld 251 (*)    Calcium 8.8 (*)    All other components within normal limits  URINALYSIS, COMPLETE (UACMP) WITH MICROSCOPIC - Abnormal; Notable for the following components:   Color, Urine YELLOW (*)    APPearance CLEAR (*)    All other components within normal limits  CBC  POC URINE PREG, ED  POCT PREGNANCY, URINE  ____________________________________________  EKG  ED ECG REPORT I, Dionne Bucy, the attending physician, personally viewed and interpreted this ECG.  Date: 07/22/2018 EKG Time: 1231 Rate: 75 Rhythm: normal sinus rhythm QRS Axis: normal Intervals: normal ST/T Wave abnormalities: normal Narrative Interpretation: no evidence of acute ischemia    ____________________________________________  RADIOLOGY    ____________________________________________   PROCEDURES  Procedure(s) performed: No  Procedures  Critical Care performed: No ____________________________________________   INITIAL IMPRESSION / ASSESSMENT AND PLAN / ED COURSE  Pertinent labs & imaging results that were available during my care of the patient were reviewed by me and considered in my medical decision making (see chart for details).  42 year old female with PMH as noted above including asthma, DM, and RA presents with acute onset of vertigo last night, now somewhat improved, associated with nausea, as well as a occipital headache with some photophobia.  On exam, the patient is relatively well-appearing, the vital signs are normal except for slight hypertension, and the neuro exam is nonfocal.  Given the patient's age, the acute onset of symptoms, and the description of the symptoms,  this is consistent with peripheral vertigo.  Differential also includes migraine.  Since the neuro exam is otherwise normal, there is no indication for lab work-up or imaging.  I will treat symptomatically with meclizine and Reglan and reassess.  Anticipate discharge home with the patient is ambulating.  ----------------------------------------- 3:09 PM on 07/22/2018 -----------------------------------------  The patient is feeling better and would like to go home.  I will give her a neurology referral.  Return precautions given, and she expresses understanding. ____________________________________________   FINAL CLINICAL IMPRESSION(S) / ED DIAGNOSES  Final diagnoses:  Peripheral vertigo, unspecified laterality      NEW MEDICATIONS STARTED DURING THIS VISIT:  New Prescriptions   No medications on file     Note:  This document was prepared using Dragon voice recognition software and may include unintentional dictation errors.        Dionne Bucy, MD 07/22/18 [id number]

## 2018-07-22 NOTE — ED Triage Notes (Signed)
Pt to ED via POV stating that she has been having dizziness and nausea that started this morning. Pt states that on the way to the ED she had a pain in the back of her head, near her neck. Pt states that she is also sensitive to light this morning, worse than normal. Pt is currently in NAD.

## 2018-07-22 NOTE — ED Notes (Signed)
FIRST NURSE NOTE:  Pt c/o dizziness since 3am, no distress noted on arrival at registration desk. Pt brought in by family.

## 2018-07-22 NOTE — Discharge Instructions (Addendum)
He may take the meclizine as needed for dizziness and Reglan as needed for nausea.  We have provided you with a referral to a neurologist if your symptoms persist although if your symptoms resolve in the next few days you can just follow-up with your regular doctor.  Return to the ER for new, worsening, persistent severe dizziness, nausea, headache, weakness, vision changes, or any other new or worsening symptoms that concern you.

## 2018-07-26 ENCOUNTER — Ambulatory Visit: Payer: Self-pay | Admitting: Adult Health Nurse Practitioner

## 2018-07-26 VITALS — BP 116/77 | HR 101 | Temp 98.6°F | Ht 66.0 in | Wt 275.7 lb

## 2018-07-26 DIAGNOSIS — E039 Hypothyroidism, unspecified: Secondary | ICD-10-CM

## 2018-07-26 DIAGNOSIS — R42 Dizziness and giddiness: Secondary | ICD-10-CM | POA: Insufficient documentation

## 2018-07-26 DIAGNOSIS — Z8719 Personal history of other diseases of the digestive system: Secondary | ICD-10-CM

## 2018-07-26 DIAGNOSIS — E119 Type 2 diabetes mellitus without complications: Secondary | ICD-10-CM

## 2018-07-26 DIAGNOSIS — Z794 Long term (current) use of insulin: Principal | ICD-10-CM

## 2018-07-26 MED ORDER — MONTELUKAST SODIUM 10 MG PO TABS: 10.0000 mg | ORAL_TABLET | Freq: Every day | ORAL | 1 refills | Status: DC

## 2018-07-26 MED ORDER — LEVOTHYROXINE SODIUM 175 MCG PO TABS: 175.0000 ug | ORAL_TABLET | Freq: Every day | ORAL | 4 refills | Status: DC

## 2018-07-26 MED ORDER — LEVOTHYROXINE SODIUM 125 MCG PO TABS: 125.0000 ug | ORAL_TABLET | Freq: Every day | ORAL | 4 refills | Status: DC

## 2018-07-26 MED ORDER — LIRAGLUTIDE 18 MG/3ML ~~LOC~~ SOPN: 1.8000 mg | PEN_INJECTOR | Freq: Every day | SUBCUTANEOUS | 3 refills | Status: DC

## 2018-07-26 MED ORDER — OMEPRAZOLE 20 MG PO CPDR: 20.0000 mg | DELAYED_RELEASE_CAPSULE | Freq: Every day | ORAL | 3 refills | Status: DC

## 2018-07-26 NOTE — Progress Notes (Signed)
Subjective:    Patient ID: Ashley Cochran, female    DOB: [birth year], 42 y.o.   MRN: [id number]  HPI  Henry Demeritt is a 42 yo F here for med refill and presents w/ c/c of dizziness. She was seen in the ED on 8/2 for peripheral vertigo and migraine with a normal neuro exam. She was Rx Meclizine and Reglan. She reports she has been taking meds and symptoms have improved w/ no further episodes. Today was her first day back at work and felt ok until she got hot and felt dizzy but she took Mecliizine with relief. She denies vomiting.  She reports she has been out of Omeprazole for a few months.  RA: pt sees a Rheumatologist for meds   Patient Active Problem List   Diagnosis Date Noted  . Nephrolithiasis 08/19/2016  . Diabetes (HCC) 02/11/2016  . Syncope, near 10/22/2015  . Hypothyroidism 04/09/2015  . Rheumatoid arthritis (HCC) 04/09/2015   Allergies as of 07/26/2018      Reactions   Nitrofuran Derivatives Rash   jittery   Latex    Strawberry Flavor    Tape Rash      Medication List        Accurate as of 07/26/18  6:15 PM. Always use your most recent med list.          albuterol 108 (90 Base) MCG/ACT inhaler Commonly known as:  PROVENTIL HFA;VENTOLIN HFA Inhale 2 puffs into the lungs every 6 (six) hours as needed for wheezing or shortness of breath.   aspirin-acetaminophen-caffeine 250-250-65 MG tablet Commonly known as:  EXCEDRIN MIGRAINE Take by mouth.   baclofen 20 MG tablet Commonly known as:  LIORESAL Take 20 mg by mouth.   cyclobenzaprine 10 MG tablet Commonly known as:  FLEXERIL Take 10 mg by mouth as needed for muscle spasms (every 8 hours as needed for spasms.).   Etanercept 50 MG/ML Soct Inject 1 Dose into the skin once a week.   Fluticasone-Salmeterol 250-50 MCG/DOSE Aepb Commonly known as:  ADVAIR DISKUS Inhale 1 puff into the lungs 2 (two) times daily.   hydroxychloroquine 200 MG tablet Commonly known as:  PLAQUENIL Take 200 mg by mouth 2 (two)  times daily.   levothyroxine 125 MCG tablet Commonly known as:  SYNTHROID, LEVOTHROID Take 1 tablet (125 mcg total) by mouth at bedtime. Sunday, Tuesday, Wednesday, Friday, Saturday   levothyroxine 175 MCG tablet Commonly known as:  SYNTHROID, LEVOTHROID Take 1 tablet (175 mcg total) by mouth at bedtime. Monday and Thursday   liraglutide 18 MG/3ML Sopn Commonly known as:  VICTOZA Inject 0.3 mLs (1.8 mg total) into the skin daily.   meclizine 25 MG tablet Commonly known as:  ANTIVERT Take 1 tablet (25 mg total) by mouth 3 (three) times daily as needed for dizziness.   metoCLOPramide 10 MG tablet Commonly known as:  REGLAN Take 1 tablet (10 mg total) by mouth every 8 (eight) hours as needed for up to 5 days for nausea or vomiting.   montelukast 10 MG tablet Commonly known as:  SINGULAIR Take 1 tablet (10 mg total) by mouth daily.   naproxen 500 MG tablet Commonly known as:  NAPROSYN Take 0.5 tablets (250 mg total) by mouth 3 (three) times daily as needed.   omeprazole 20 MG capsule Commonly known as:  PRILOSEC Take 1 capsule (20 mg total) by mouth daily.   ondansetron 4 MG tablet Commonly known as:  ZOFRAN Take 1 tablet (4 mg total) by mouth 2 (  two) times daily.   OVER THE COUNTER MEDICATION Apply 1 application topically daily. Triderma MD   traZODone 50 MG tablet Commonly known as:  DESYREL Take 1 tablet (50 mg total) by mouth at bedtime.   triamcinolone 55 MCG/ACT Aero nasal inhaler Commonly known as:  NASACORT Place 2 sprays into the nose daily.        Review of Systems  Neurological: Negative for dizziness and headaches.  All other systems reviewed and are negative.      Objective:   Physical Exam  Constitutional: She is oriented to person, place, and time. She appears well-developed and well-nourished.  Eyes: Conjunctivae and EOM are normal. Right eye exhibits no nystagmus. Left eye exhibits no nystagmus.  Cardiovascular: Normal rate, regular rhythm  and normal heart sounds.  Pulmonary/Chest: Effort normal and breath sounds normal.  Neurological: She is alert and oriented to person, place, and time. She has normal strength.  Cranial nerves 2-12 grossly intact.   Vitals reviewed.   BP 116/77   Pulse (!) 101   Temp 98.6 F (37 C)   Ht 5\' 6"  (1.676 m)   Wt 275 lb 11.2 oz (125.1 kg)   LMP 07/11/2018   BMI 44.50 kg/m        Assessment & Plan:   Peripheral vertigo: Symptoms have improved w/ treatment. Continue current treatment. Will reassess if symptoms return.   Refilled all meds.   Routine labs tonight.   F/u in 4 weeks for routine visit.

## 2018-07-26 NOTE — Patient Instructions (Signed)

## 2018-07-27 LAB — COMPREHENSIVE METABOLIC PANEL
ALBUMIN: 3.7 g/dL (ref 3.5–5.5)
ALK PHOS: 110 IU/L (ref 39–117)
ALT: 33 IU/L — ABNORMAL HIGH (ref 0–32)
AST: 26 IU/L (ref 0–40)
Albumin/Globulin Ratio: 1 — ABNORMAL LOW (ref 1.2–2.2)
BILIRUBIN TOTAL: 0.3 mg/dL (ref 0.0–1.2)
BUN / CREAT RATIO: 18 (ref 9–23)
BUN: 12 mg/dL (ref 6–24)
CHLORIDE: 100 mmol/L (ref 96–106)
CO2: 22 mmol/L (ref 20–29)
Calcium: 9 mg/dL (ref 8.7–10.2)
Creatinine, Ser: 0.65 mg/dL (ref 0.57–1.00)
GFR calc Af Amer: 126 mL/min/{1.73_m2} (ref >59)
GFR calc non Af Amer: 109 mL/min/{1.73_m2} (ref >59)
GLUCOSE: 243 mg/dL — AB (ref 65–99)
Globulin, Total: 3.6 g/dL (ref 1.5–4.5)
POTASSIUM: 4.1 mmol/L (ref 3.5–5.2)
Sodium: 136 mmol/L (ref 134–144)
Total Protein: 7.3 g/dL (ref 6.0–8.5)

## 2018-07-27 LAB — HEMOGLOBIN A1C
Est. average glucose Bld gHb Est-mCnc: 174 mg/dL
Hgb A1c MFr Bld: 7.7 % — ABNORMAL HIGH (ref 4.8–5.6)

## 2018-07-27 LAB — LIPID PANEL
Chol/HDL Ratio: 3.9 ratio (ref 0.0–4.4)
Cholesterol, Total: 149 mg/dL (ref 100–199)
HDL: 38 mg/dL — ABNORMAL LOW (ref >39)
LDL Calculated: 78 mg/dL (ref 0–99)
Triglycerides: 165 mg/dL — ABNORMAL HIGH (ref 0–149)
VLDL CHOLESTEROL CAL: 33 mg/dL (ref 5–40)

## 2018-07-27 LAB — TSH: TSH: 11.37 u[IU]/mL — ABNORMAL HIGH (ref 0.450–4.500)

## 2018-08-15 ENCOUNTER — Ambulatory Visit: Admit: 2018-08-15 | Discharge: 2018-08-16

## 2018-08-15 DIAGNOSIS — M0579 Rheumatoid arthritis with rheumatoid factor of multiple sites without organ or systems involvement: Secondary | ICD-10-CM

## 2018-08-15 DIAGNOSIS — M059 Rheumatoid arthritis with rheumatoid factor, unspecified: Secondary | ICD-10-CM

## 2018-08-15 DIAGNOSIS — Z79899 Other long term (current) drug therapy: Secondary | ICD-10-CM

## 2018-08-15 DIAGNOSIS — Z5181 Encounter for therapeutic drug level monitoring: Principal | ICD-10-CM

## 2018-08-26 ENCOUNTER — Telehealth: Payer: Self-pay | Admitting: Pharmacist

## 2018-08-26 NOTE — Telephone Encounter (Signed)
08/26/2018 9:38:45 AM - Victoza & tips renewal with [employer]  08/26/18 Faxed [employer] application for [employer] for [employer] 1.8mg  daily & 32G tips, I sent patient's W2 since I do not have patient's 2018 taxes, not use if they will accept without 1040.[name]

## 2018-09-01 ENCOUNTER — Ambulatory Visit: Payer: Self-pay | Admitting: Urology

## 2018-09-01 VITALS — BP 121/64 | HR 81 | Temp 97.9°F | Ht 65.0 in | Wt 276.4 lb

## 2018-09-01 DIAGNOSIS — E039 Hypothyroidism, unspecified: Secondary | ICD-10-CM

## 2018-09-01 DIAGNOSIS — Z8719 Personal history of other diseases of the digestive system: Secondary | ICD-10-CM

## 2018-09-01 DIAGNOSIS — E119 Type 2 diabetes mellitus without complications: Secondary | ICD-10-CM

## 2018-09-01 MED ORDER — LEVOTHYROXINE SODIUM 175 MCG PO TABS: ORAL_TABLET | ORAL | 4 refills | Status: DC

## 2018-09-01 MED ORDER — METOCLOPRAMIDE HCL 10 MG PO TABS: 10.0000 mg | ORAL_TABLET | Freq: Three times a day (TID) | ORAL | 0 refills | Status: DC | PRN

## 2018-09-01 MED ORDER — OMEPRAZOLE 20 MG PO CPDR: 20.0000 mg | DELAYED_RELEASE_CAPSULE | Freq: Every day | ORAL | 3 refills | Status: DC

## 2018-09-01 MED ORDER — MECLIZINE HCL 25 MG PO TABS: 25.0000 mg | ORAL_TABLET | Freq: Three times a day (TID) | ORAL | 0 refills | Status: DC | PRN

## 2018-09-01 MED ORDER — NAPROXEN 500 MG PO TABS: 250.0000 mg | ORAL_TABLET | Freq: Three times a day (TID) | ORAL | 4 refills | Status: DC | PRN

## 2018-09-01 NOTE — Progress Notes (Signed)
Patient: Ashley Cochran Female    DOB: 05-11-1976   42 y.o.   MRN: [id number] Visit Date: 09/01/2018  Today's Provider: Michiel Cowboy, PA-C   Chief Complaint  Patient presents with  . Dizziness    follow up   Subjective:    HPI Patient with vertigo - needs meds refilled Patient with hypothyroidism - meds refilled and TSH needed  She has signed the paper work for the victoza    Allergies  Allergen Reactions  . Nitrofuran Derivatives Rash    jittery  . Latex   . Strawberry Flavor   . Tape Rash   Previous Medications   ALBUTEROL (PROVENTIL HFA;VENTOLIN HFA) 108 (90 BASE) MCG/ACT INHALER    Inhale 2 puffs into the lungs every 6 (six) hours as needed for wheezing or shortness of breath.   ASPIRIN-ACETAMINOPHEN-CAFFEINE (EXCEDRIN MIGRAINE) 250-250-65 MG TABLET    Take by mouth.   BACLOFEN (LIORESAL) 20 MG TABLET    Take 20 mg by mouth.   CYCLOBENZAPRINE (FLEXERIL) 10 MG TABLET    Take 10 mg by mouth as needed for muscle spasms (every 8 hours as needed for spasms.).   ETANERCEPT 50 MG/ML SOCT    Inject 1 Dose into the skin once a week.   FLUTICASONE-SALMETEROL (ADVAIR DISKUS) 250-50 MCG/DOSE AEPB    Inhale 1 puff into the lungs 2 (two) times daily.   HYDROXYCHLOROQUINE (PLAQUENIL) 200 MG TABLET    Take 200 mg by mouth 2 (two) times daily.   LEVOTHYROXINE (SYNTHROID, LEVOTHROID) 125 MCG TABLET    Take 1 tablet (125 mcg total) by mouth at bedtime. Sunday, Tuesday, Wednesday, Friday, Saturday   LEVOTHYROXINE (SYNTHROID, LEVOTHROID) 175 MCG TABLET    Take 1 tablet (175 mcg total) by mouth at bedtime. Monday and Thursday   LIRAGLUTIDE (VICTOZA) 18 MG/3ML SOPN    Inject 0.3 mLs (1.8 mg total) into the skin daily.   MECLIZINE (ANTIVERT) 25 MG TABLET    Take 1 tablet (25 mg total) by mouth 3 (three) times daily as needed for dizziness.   METOCLOPRAMIDE (REGLAN) 10 MG TABLET    Take 1 tablet (10 mg total) by mouth every 8 (eight) hours as needed for up to 5 days for nausea or vomiting.   MONTELUKAST (SINGULAIR) 10 MG TABLET    Take 1 tablet (10 mg total) by mouth daily.   NAPROXEN (NAPROSYN) 500 MG TABLET    Take 0.5 tablets (250 mg total) by mouth 3 (three) times daily as needed.   OMEPRAZOLE (PRILOSEC) 20 MG CAPSULE    Take 1 capsule (20 mg total) by mouth daily.   ONDANSETRON (ZOFRAN) 4 MG TABLET    Take 1 tablet (4 mg total) by mouth 2 (two) times daily.   OVER THE COUNTER MEDICATION    Apply 1 application topically daily. Triderma MD   TRAZODONE (DESYREL) 50 MG TABLET    Take 1 tablet (50 mg total) by mouth at bedtime.   TRIAMCINOLONE (NASACORT) 55 MCG/ACT AERO NASAL INHALER    Place 2 sprays into the nose daily.    Review of Systems  Gastrointestinal: Positive for nausea.  Neurological: Positive for dizziness.  All other systems reviewed and are negative.   Social History   Tobacco Use  . Smoking status: Former Smoker    Last attempt to quit: 09/20/2004    Years since quitting: 13.9  . Smokeless tobacco: Never Used  Substance Use Topics  . Alcohol use: No   Objective:   BP 121/64   Pulse  81   Temp 97.9 F (36.6 C)   Ht 5\' 5"  (1.651 m)   Wt 276 lb 6.4 oz (125.4 kg)   LMP 08/01/2018 (Approximate)   BMI 46.00 kg/m   Physical Exam  HENT:  Head: Normocephalic.  Right Ear: Hearing normal.  Left Ear: Hearing normal.  Nose: Nose normal.  Neck: Normal range of motion. Neck supple.  Feet:  Right Foot:  Protective Sensation: 5 sites tested. 5 sites sensed.  Skin Integrity: Positive for dry skin.  Left Foot:  Protective Sensation: 5 sites tested. 5 sites sensed.  Skin Integrity: Positive for dry skin.        Assessment & Plan:     1. Veritgo  meds refilled  2. Hypothyroidism  TSH rechecked tonight  3. DM  HgbA1c rechecked tonight       10/01/2018, PA-C   [facility]

## 2018-09-02 LAB — HEMOGLOBIN A1C
ESTIMATED AVERAGE GLUCOSE: 171 mg/dL
HEMOGLOBIN A1C: 7.6 % — AB (ref 4.8–5.6)

## 2018-09-02 LAB — TSH: TSH: 1.26 u[IU]/mL (ref 0.450–4.500)

## 2018-09-21 ENCOUNTER — Telehealth: Payer: Self-pay | Admitting: Pharmacist

## 2018-09-21 NOTE — Telephone Encounter (Signed)
09/21/2018 2:24:39 PM - Advair & Ventolin refills  09/21/18 Placed refills online with [facility] for Advair 250/50 & Ventolin HFA, to release 10/05/18, order# [id number].[name]

## 2018-09-27 ENCOUNTER — Ambulatory Visit: Payer: Self-pay

## 2018-10-11 ENCOUNTER — Ambulatory Visit: Payer: Self-pay | Admitting: Adult Health Nurse Practitioner

## 2018-10-11 VITALS — BP 120/76 | Temp 98.6°F | Ht 65.0 in | Wt 275.6 lb

## 2018-10-11 DIAGNOSIS — E119 Type 2 diabetes mellitus without complications: Secondary | ICD-10-CM

## 2018-10-11 DIAGNOSIS — Z794 Long term (current) use of insulin: Secondary | ICD-10-CM

## 2018-10-11 DIAGNOSIS — E039 Hypothyroidism, unspecified: Secondary | ICD-10-CM

## 2018-10-11 NOTE — Progress Notes (Signed)
Patient: Ashley Cochran Female    DOB: 1976-10-01   42 y.o.   MRN: [id number] Visit Date: 10/11/2018  Today's Provider: Jacelyn Pi, NP   Chief Complaint  Patient presents with  . Follow-up    vertigo follow up   Subjective:    HPI    Last visit Synthroid was changed to daily- TSH 1.260 currently.   Current A1c is 7.6 Pt states that she starting to take her Victoza correctly.     Allergies  Allergen Reactions  . Nitrofuran Derivatives Rash    jittery  . Latex   . Strawberry Flavor   . Tape Rash   Previous Medications   ALBUTEROL (PROVENTIL HFA;VENTOLIN HFA) 108 (90 BASE) MCG/ACT INHALER    Inhale 2 puffs into the lungs every 6 (six) hours as needed for wheezing or shortness of breath.   ASPIRIN-ACETAMINOPHEN-CAFFEINE (EXCEDRIN MIGRAINE) 250-250-65 MG TABLET    Take by mouth.   BACLOFEN (LIORESAL) 20 MG TABLET    Take 20 mg by mouth.   CYCLOBENZAPRINE (FLEXERIL) 10 MG TABLET    Take 10 mg by mouth as needed for muscle spasms (every 8 hours as needed for spasms.).   ETANERCEPT 50 MG/ML SOCT    Inject 1 Dose into the skin once a week.   FLUTICASONE-SALMETEROL (ADVAIR DISKUS) 250-50 MCG/DOSE AEPB    Inhale 1 puff into the lungs 2 (two) times daily.   HYDROXYCHLOROQUINE (PLAQUENIL) 200 MG TABLET    Take 200 mg by mouth 2 (two) times daily.   LEVOTHYROXINE (SYNTHROID, LEVOTHROID) 175 MCG TABLET    Take daily   LIRAGLUTIDE (VICTOZA) 18 MG/3ML SOPN    Inject 0.3 mLs (1.8 mg total) into the skin daily.   MECLIZINE (ANTIVERT) 25 MG TABLET    Take 1 tablet (25 mg total) by mouth 3 (three) times daily as needed for dizziness.   METOCLOPRAMIDE (REGLAN) 10 MG TABLET    Take 1 tablet (10 mg total) by mouth every 8 (eight) hours as needed for up to 5 days for nausea or vomiting.   MONTELUKAST (SINGULAIR) 10 MG TABLET    Take 1 tablet (10 mg total) by mouth daily.   NAPROXEN (NAPROSYN) 500 MG TABLET    Take 0.5 tablets (250 mg total) by mouth 3 (three) times daily as needed.    OMEPRAZOLE (PRILOSEC) 20 MG CAPSULE    Take 1 capsule (20 mg total) by mouth daily.   ONDANSETRON (ZOFRAN) 4 MG TABLET    Take 1 tablet (4 mg total) by mouth 2 (two) times daily.   OVER THE COUNTER MEDICATION    Apply 1 application topically daily. Triderma MD   TRAZODONE (DESYREL) 50 MG TABLET    Take 1 tablet (50 mg total) by mouth at bedtime.   TRIAMCINOLONE (NASACORT) 55 MCG/ACT AERO NASAL INHALER    Place 2 sprays into the nose daily.    Review of Systems  All other systems reviewed and are negative.   Social History   Tobacco Use  . Smoking status: Former Smoker    Last attempt to quit: 09/20/2004    Years since quitting: 14.0  . Smokeless tobacco: Never Used  Substance Use Topics  . Alcohol use: No   Objective:   BP 120/76   Temp 98.6 F (37 C)   Ht 5\' 5"  (1.651 m)   Wt 275 lb 9.6 oz (125 kg)   BMI 45.86 kg/m   Physical Exam  Constitutional: She appears well-developed and well-nourished.  HENT:  Head:  Normocephalic and atraumatic.  Neck: No thyromegaly present.  Cardiovascular: Normal rate, regular rhythm and normal heart sounds.  Pulmonary/Chest: Effort normal and breath sounds normal.  Abdominal: Soft. Bowel sounds are normal.  Skin: Skin is warm and dry.  Vitals reviewed.       Assessment & Plan:        Hypothyroidism:  Continue current therapy.   DM:  Not quite at goal.  Encourage diabetic diet and exercise.  Continue current medication regimen.  Encourage compliance with medications daily.    FU at the end of Dec for routine visit. Labs a week prior.      Jacelyn Pi, NP   [facility]

## 2018-11-15 ENCOUNTER — Telehealth: Payer: Self-pay | Admitting: Pharmacist

## 2018-11-15 NOTE — Telephone Encounter (Signed)
11/15/2018 11:54:17 AM - Advair & Ventolin refills  11/15/18 Placed refills online with [facility] for Ventolin HFA & Advair 250/50, to release 12/19/18, order# [id number].[name]

## 2018-12-01 ENCOUNTER — Other Ambulatory Visit: Payer: Self-pay

## 2018-12-08 ENCOUNTER — Ambulatory Visit: Payer: Self-pay | Admitting: Adult Health Nurse Practitioner

## 2018-12-08 VITALS — BP 115/76 | HR 74 | Temp 98.3°F | Wt 277.8 lb

## 2018-12-08 DIAGNOSIS — M069 Rheumatoid arthritis, unspecified: Secondary | ICD-10-CM

## 2018-12-08 DIAGNOSIS — E119 Type 2 diabetes mellitus without complications: Secondary | ICD-10-CM

## 2018-12-08 DIAGNOSIS — E039 Hypothyroidism, unspecified: Secondary | ICD-10-CM

## 2018-12-08 DIAGNOSIS — Z794 Long term (current) use of insulin: Secondary | ICD-10-CM

## 2018-12-08 DIAGNOSIS — M25561 Pain in right knee: Secondary | ICD-10-CM | POA: Insufficient documentation

## 2018-12-08 MED ORDER — NAPROXEN 500 MG PO TABS: 250.0000 mg | ORAL_TABLET | Freq: Three times a day (TID) | ORAL | 4 refills | Status: DC | PRN

## 2018-12-08 MED ORDER — LIRAGLUTIDE 18 MG/3ML ~~LOC~~ SOPN: 1.8000 mg | PEN_INJECTOR | Freq: Every day | SUBCUTANEOUS | 3 refills | Status: DC

## 2018-12-08 MED ORDER — LEVOTHYROXINE SODIUM 175 MCG PO TABS: ORAL_TABLET | ORAL | 4 refills | Status: DC

## 2018-12-08 NOTE — Progress Notes (Signed)
Patient: Ashley Cochran Female    DOB: 1976-09-12   42 y.o.   MRN: [id number] Visit Date: 12/08/2018  Today's Provider: Jacelyn Pi, NP   Chief Complaint  Patient presents with  . Knee Pain  . Medication Refill   Subjective:    HPI   Pt states that her right knee is swelling and having pain. States it is worse when it rains. Taking Naproxen with moderate relief.   Has been out of Enbrel for RA x 2 weeks.    Taking Victoza for DM- last A1c was 7.6. Cannot take Metformin- due to GI disturbance. Has not tried any other agents in the past.   Taking Synthroid as directed.   Allergies  Allergen Reactions  . Nitrofuran Derivatives Rash    jittery  . Latex   . Strawberry Flavor   . Tape Rash   Previous Medications   ALBUTEROL (PROVENTIL HFA;VENTOLIN HFA) 108 (90 BASE) MCG/ACT INHALER    Inhale 2 puffs into the lungs every 6 (six) hours as needed for wheezing or shortness of breath.   ASPIRIN-ACETAMINOPHEN-CAFFEINE (EXCEDRIN MIGRAINE) 250-250-65 MG TABLET    Take by mouth.   BACLOFEN (LIORESAL) 20 MG TABLET    Take 20 mg by mouth.   CYCLOBENZAPRINE (FLEXERIL) 10 MG TABLET    Take 10 mg by mouth as needed for muscle spasms (every 8 hours as needed for spasms.).   ETANERCEPT 50 MG/ML SOCT    Inject 1 Dose into the skin once a week.   FLUTICASONE-SALMETEROL (ADVAIR DISKUS) 250-50 MCG/DOSE AEPB    Inhale 1 puff into the lungs 2 (two) times daily.   HYDROXYCHLOROQUINE (PLAQUENIL) 200 MG TABLET    Take 200 mg by mouth 2 (two) times daily.   LEVOTHYROXINE (SYNTHROID, LEVOTHROID) 175 MCG TABLET    Take daily   LIRAGLUTIDE (VICTOZA) 18 MG/3ML SOPN    Inject 0.3 mLs (1.8 mg total) into the skin daily.   MECLIZINE (ANTIVERT) 25 MG TABLET    Take 1 tablet (25 mg total) by mouth 3 (three) times daily as needed for dizziness.   METOCLOPRAMIDE (REGLAN) 10 MG TABLET    Take 1 tablet (10 mg total) by mouth every 8 (eight) hours as needed for up to 5 days for nausea or vomiting.   MONTELUKAST  (SINGULAIR) 10 MG TABLET    Take 1 tablet (10 mg total) by mouth daily.   NAPROXEN (NAPROSYN) 500 MG TABLET    Take 0.5 tablets (250 mg total) by mouth 3 (three) times daily as needed.   OMEPRAZOLE (PRILOSEC) 20 MG CAPSULE    Take 1 capsule (20 mg total) by mouth daily.   ONDANSETRON (ZOFRAN) 4 MG TABLET    Take 1 tablet (4 mg total) by mouth 2 (two) times daily.   OVER THE COUNTER MEDICATION    Apply 1 application topically daily. Triderma MD   TRAZODONE (DESYREL) 50 MG TABLET    Take 1 tablet (50 mg total) by mouth at bedtime.   TRIAMCINOLONE (NASACORT) 55 MCG/ACT AERO NASAL INHALER    Place 2 sprays into the nose daily.    Review of Systems  All other systems reviewed and are negative.   Social History   Tobacco Use  . Smoking status: Former Smoker    Last attempt to quit: 09/20/2004    Years since quitting: 14.2  . Smokeless tobacco: Never Used  Substance Use Topics  . Alcohol use: No   Objective:   BP 115/76   Pulse 74  Temp 98.3 F (36.8 C) (Oral)   Wt 277 lb 12.8 oz (126 kg)   LMP 11/15/2018   BMI 46.23 kg/m   Physical Exam Vitals signs reviewed.  Constitutional:      Appearance: Normal appearance.  HENT:     Head: Normocephalic and atraumatic.  Cardiovascular:     Rate and Rhythm: Normal rate and regular rhythm.  Pulmonary:     Effort: Pulmonary effort is normal.     Breath sounds: Normal breath sounds.  Abdominal:     General: Abdomen is flat.     Palpations: Abdomen is soft.  Neurological:     Mental Status: She is alert and oriented to person, place, and time.         Assessment & Plan:     Will get labs tonight.   FU with the rheumatologist.   Discussed risks of taking ASA along with Naproxen.   If A1c is still >7 will start glipizide.   Pt will have insurance in January.       Jacelyn Pi, NP   [facility]

## 2018-12-08 NOTE — Progress Notes (Signed)
Follow up; medication refill; R knee pain-arthritis.

## 2018-12-09 ENCOUNTER — Telehealth: Payer: Self-pay | Admitting: Pharmacist

## 2018-12-09 LAB — COMPREHENSIVE METABOLIC PANEL
ALBUMIN: 3.8 g/dL (ref 3.5–5.5)
ALT: 34 IU/L — ABNORMAL HIGH (ref 0–32)
AST: 33 IU/L (ref 0–40)
Albumin/Globulin Ratio: 1 — ABNORMAL LOW (ref 1.2–2.2)
Alkaline Phosphatase: 124 IU/L — ABNORMAL HIGH (ref 39–117)
BUN / CREAT RATIO: 18 (ref 9–23)
BUN: 12 mg/dL (ref 6–24)
Bilirubin Total: 0.2 mg/dL (ref 0.0–1.2)
CO2: 23 mmol/L (ref 20–29)
CREATININE: 0.65 mg/dL (ref 0.57–1.00)
Calcium: 9 mg/dL (ref 8.7–10.2)
Chloride: 98 mmol/L (ref 96–106)
GFR, EST AFRICAN AMERICAN: 125 mL/min/{1.73_m2} (ref >59)
GFR, EST NON AFRICAN AMERICAN: 108 mL/min/{1.73_m2} (ref >59)
GLOBULIN, TOTAL: 3.7 g/dL (ref 1.5–4.5)
Glucose: 393 mg/dL — ABNORMAL HIGH (ref 65–99)
Potassium: 4.4 mmol/L (ref 3.5–5.2)
Sodium: 135 mmol/L (ref 134–144)
TOTAL PROTEIN: 7.5 g/dL (ref 6.0–8.5)

## 2018-12-09 LAB — LIPID PANEL
Chol/HDL Ratio: 4.5 ratio — ABNORMAL HIGH (ref 0.0–4.4)
Cholesterol, Total: 147 mg/dL (ref 100–199)
HDL: 33 mg/dL — AB (ref >39)
LDL CALC: 64 mg/dL (ref 0–99)
TRIGLYCERIDES: 248 mg/dL — AB (ref 0–149)
VLDL CHOLESTEROL CAL: 50 mg/dL — AB (ref 5–40)

## 2018-12-09 LAB — HEMOGLOBIN A1C
Est. average glucose Bld gHb Est-mCnc: 226 mg/dL
Hgb A1c MFr Bld: 9.5 % — ABNORMAL HIGH (ref 4.8–5.6)

## 2018-12-09 NOTE — Telephone Encounter (Signed)
12/09/2018 9:25:59 AM - Victoza & tips refill  12/09/18 Faxed Novo Nordisk refill request for Victoza Inject 1.8 mg once daily, also Novofine 32G tips.[name]

## 2019-01-12 ENCOUNTER — Ambulatory Visit: Payer: Self-pay | Admitting: Family Medicine

## 2019-01-24 ENCOUNTER — Telehealth: Payer: Self-pay | Admitting: Pharmacy Technician

## 2019-01-24 NOTE — Telephone Encounter (Signed)
Patient stated that she has insurance through her employer.  Patient no longer meets [facility]'s eligibility criteria.  Patient acknowledged that she understood that [facility] could no longer provide medication assistance.  Sherilyn Dacosta Care Manager [facility]

## 2019-01-26 ENCOUNTER — Encounter: Payer: Self-pay | Admitting: Family Medicine

## 2019-01-26 ENCOUNTER — Other Ambulatory Visit: Payer: Self-pay

## 2019-01-26 ENCOUNTER — Ambulatory Visit (INDEPENDENT_AMBULATORY_CARE_PROVIDER_SITE_OTHER): Payer: Self-pay | Admitting: Family Medicine

## 2019-01-26 VITALS — BP 116/76 | HR 81 | Temp 97.9°F | Resp 16 | Ht 65.0 in | Wt 266.4 lb

## 2019-01-26 DIAGNOSIS — E785 Hyperlipidemia, unspecified: Secondary | ICD-10-CM | POA: Insufficient documentation

## 2019-01-26 DIAGNOSIS — E1165 Type 2 diabetes mellitus with hyperglycemia: Secondary | ICD-10-CM

## 2019-01-26 DIAGNOSIS — Z7689 Persons encountering health services in other specified circumstances: Secondary | ICD-10-CM

## 2019-01-26 DIAGNOSIS — M069 Rheumatoid arthritis, unspecified: Secondary | ICD-10-CM

## 2019-01-26 DIAGNOSIS — E1169 Type 2 diabetes mellitus with other specified complication: Secondary | ICD-10-CM

## 2019-01-26 DIAGNOSIS — Z6841 Body Mass Index (BMI) 40.0 and over, adult: Secondary | ICD-10-CM

## 2019-01-26 DIAGNOSIS — IMO0002 Reserved for concepts with insufficient information to code with codable children: Secondary | ICD-10-CM | POA: Insufficient documentation

## 2019-01-26 DIAGNOSIS — E039 Hypothyroidism, unspecified: Secondary | ICD-10-CM

## 2019-01-26 LAB — GLUCOSE, POCT (MANUAL RESULT ENTRY): POC GLUCOSE: 211 mg/dL — AB (ref 70–99)

## 2019-01-26 NOTE — Progress Notes (Signed)
Name: Ashley Cochran   MRN: [id number]    DOB: 1976/07/09   Date:01/26/2019       Progress Note  Subjective  Chief Complaint  Chief Complaint  Patient presents with  . Establish Care    HPI  PT presents to establish care. She used to come to our clinic, was at [facility] for several years, recently obtained insurance, and is returning to our clinic.  Diabetes mellitus type 2 Checking sugars?  no How often? Not checking Does patient feel additional teaching/training would be helpful?  yes  Have they attended Diabetes education classes? no  Trying to limit white bread, white rice, white potatoes, sweets?  no- it's difficult to eat healthy with her family's lifestyle - family likes pasta and bread, and that's what's available. Trying to limit sweetened drinks like iced tea, soft drinks, sports drinks, fruit juices?  no - drinks 2 regular Pepsi's - 32-40oz soda daily. Checking feet every day/night?  no Last eye exam:  Needs to go for eye exam.  Denies: polyphagia, vision changes.  Endorses polydipsia, polyuria, endorses bilateral foot neuropathy.  Most recent A1C:  Lab Results  Component Value Date   HGBA1C 9.5 (H) 12/08/2018   Last CMP Results : is not due for repeat today    Component Value Date/Time   NA 135 12/08/2018 1920   NA 134 (L) 02/02/2014 2059   K 4.4 12/08/2018 1920   K 3.6 02/02/2014 2059   CL 98 12/08/2018 1920   CL 101 02/02/2014 2059   CO2 23 12/08/2018 1920   CO2 22 02/02/2014 2059   GLUCOSE 393 (H) 12/08/2018 1920   GLUCOSE 251 (H) 07/22/2018 1234   GLUCOSE 180 (H) 02/02/2014 2059   BUN 12 12/08/2018 1920   BUN 11 02/02/2014 2059   CREATININE 0.65 12/08/2018 1920   CREATININE 0.90 02/02/2014 2059   CALCIUM 9.0 12/08/2018 1920   CALCIUM 9.0 02/02/2014 2059   PROT 7.5 12/08/2018 1920   PROT 8.8 (H) 02/02/2014 2059   ALBUMIN 3.8 12/08/2018 1920   ALBUMIN 3.5 02/02/2014 2059   AST 33 12/08/2018 1920   AST 46 (H) 02/02/2014 2059   ALT 34 (H)  12/08/2018 1920   ALT 51 02/02/2014 2059   ALKPHOS 124 (H) 12/08/2018 1920   ALKPHOS 143 (H) 02/02/2014 2059   BILITOT 0.2 12/08/2018 1920   BILITOT 0.6 02/02/2014 2059   GFRNONAA 108 12/08/2018 1920   GFRNONAA >60 02/02/2014 2059   GFRAA 125 12/08/2018 1920   GFRAA >60 02/02/2014 2059   Urine Micro UTD? No Current Medication Management: Diabetic Medications: Victoza daily - she forgets to take her Victoza regularly.  Can't take Metformin due to significant diarrhea. She has not taken any other oral medications.  Discussed Xultophy with the patient - we will start today at 12 units.  ACEI/ARB: No Statin: Not Indicated Aspirin therapy: Yes  RA: She is taking Etanercept 50mg  once weekly.  This works very well for her, though she has been out for a month. [facility] has [facility].  Hypothyroidism: Taking synthroid daily.  Does have occaisonal dizziness and brittle hair/nails.  She denies palpitations or constipation. We will recheck at next visit.  Obesity: See DM HPI regarding dietary teaching. She is not exercising. Body mass index is 44.33 kg/m.  Patient Active Problem List   Diagnosis Date Noted  . Class 3 severe obesity due to excess calories with serious comorbidity and body mass index (BMI) of 40.0  to 44.9 in adult [facility](HCC) 01/26/2019  . Type 2 diabetes mellitus with hyperglycemia, without long-term current use of insulin (HCC) 01/26/2019  . Dyslipidemia associated with type 2 diabetes mellitus (HCC) 01/26/2019  . Knee pain, right 12/08/2018  . Vertigo 07/26/2018  . Nephrolithiasis 08/19/2016  . Syncope, near 10/22/2015  . Hypothyroidism 04/09/2015  . Rheumatoid arthritis (HCC) 04/09/2015   History reviewed. No pertinent surgical history.  Family History  Problem Relation Age of Onset  . Hypertension Mother   . Diabetes Mother        type 2  . Asthma Mother   . Stroke Mother        Mini strokes  . GER disease Mother   . Hypertension  Father   . Diabetes Father        Type 2  . Asthma Father   . GER disease Father   . Asthma Sister   . Allergies Sister   . Asthma Brother   . Allergies Brother     Social History   Socioeconomic History  . Marital status: Legally Separated    Spouse name: Not on file  . Number of children: Not on file  . Years of education: Not on file  . Highest education level: Not on file  Occupational History  . Not on file  Social Needs  . Financial resource strain: Somewhat hard  . Food insecurity:    Worry: Never true    Inability: Never true  . Transportation needs:    Medical: No    Non-medical: No  Tobacco Use  . Smoking status: Former Smoker    Last attempt to quit: 09/20/2004    Years since quitting: 14.3  . Smokeless tobacco: Never Used  Substance and Sexual Activity  . Alcohol use: No  . Drug use: No  . Sexual activity: Not on file  Lifestyle  . Physical activity:    Days per week: 0 days    Minutes per session: 0 min  . Stress: To some extent  Relationships  . Social connections:    Talks on phone: More than three times a week    Gets together: Once a week    Attends religious service: Never    Active member of club or organization: No    Attends meetings of clubs or organizations: Never    Relationship status: Separated  . Intimate partner violence:    Fear of current or ex partner: No    Emotionally abused: No    Physically abused: No    Forced sexual activity: No  Other Topics Concern  . Not on file  Social History Narrative  . Not on file     Current Outpatient Medications:  .  albuterol (PROVENTIL HFA;VENTOLIN HFA) 108 (90 Base) MCG/ACT inhaler, Inhale 2 puffs into the lungs every 6 (six) hours as needed for wheezing or shortness of breath., Disp: 3 Inhaler, Rfl: 4 .  aspirin-acetaminophen-caffeine (EXCEDRIN MIGRAINE) 250-250-65 MG tablet, Take by mouth., Disp: , Rfl:  .  baclofen (LIORESAL) 20 MG tablet, Take 20 mg by mouth., Disp: , Rfl:  .   cyclobenzaprine (FLEXERIL) 10 MG tablet, Take 10 mg by mouth as needed for muscle spasms (every 8 hours as needed for spasms.)., Disp: , Rfl:  .  Etanercept 50 MG/ML SOCT, Inject 1 Dose into the skin once a week., Disp: , Rfl:  .  Fluticasone-Salmeterol (ADVAIR DISKUS) 250-50 MCG/DOSE AEPB, Inhale 1 puff into the lungs 2 (two) times daily., Disp: 180 each, Rfl:  4 .  levothyroxine (SYNTHROID, LEVOTHROID) 175 MCG tablet, Take daily, Disp: 30 tablet, Rfl: 4 .  liraglutide (VICTOZA) 18 MG/3ML SOPN, Inject 0.3 mLs (1.8 mg total) into the skin daily., Disp: 36 mL, Rfl: 3 .  meclizine (ANTIVERT) 25 MG tablet, Take 1 tablet (25 mg total) by mouth 3 (three) times daily as needed for dizziness., Disp: 30 tablet, Rfl: 0 .  montelukast (SINGULAIR) 10 MG tablet, Take 1 tablet (10 mg total) by mouth daily., Disp: 90 tablet, Rfl: 1 .  naproxen (NAPROSYN) 500 MG tablet, Take 0.5 tablets (250 mg total) by mouth 3 (three) times daily as needed., Disp: 200 tablet, Rfl: 4 .  omeprazole (PRILOSEC) 20 MG capsule, Take 1 capsule (20 mg total) by mouth daily., Disp: 30 capsule, Rfl: 3 .  ondansetron (ZOFRAN) 4 MG tablet, Take 1 tablet (4 mg total) by mouth 2 (two) times daily., Disp: 20 tablet, Rfl: 1 .  OVER THE COUNTER MEDICATION, Apply 1 application topically daily. Triderma MD, Disp: , Rfl:  .  traZODone (DESYREL) 50 MG tablet, Take 1 tablet (50 mg total) by mouth at bedtime., Disp: 90 tablet, Rfl: 4 .  triamcinolone (NASACORT) 55 MCG/ACT AERO nasal inhaler, Place 2 sprays into the nose daily. (Patient not taking: Reported on 12/08/2018), Disp: 3 Inhaler, Rfl: 4  Allergies  Allergen Reactions  . Nitrofuran Derivatives Rash    jittery  . Latex   . Strawberry Flavor   . Tape Rash    I personally reviewed active problem list, medication list, allergies, family history, social history, health maintenance, notes from last encounter, lab results with the patient/caregiver today.   ROS Constitutional: Negative for  fever or weight change.  Respiratory: Negative for cough and shortness of breath.   Cardiovascular: Negative for chest pain or palpitations.  Gastrointestinal: Negative for abdominal pain, no bowel changes.  Musculoskeletal: Negative for gait problem or joint swelling.  Skin: Negative for rash.  Neurological: Negative for dizziness or headache.  No other specific complaints in a complete review of systems (except as listed in HPI above).  Objective  Vitals:   01/26/19 0920  BP: 116/76  Pulse: 81  Resp: 16  Temp: 97.9 F (36.6 C)  TempSrc: Oral  SpO2: 98%  Weight: 266 lb 6.4 oz (120.8 kg)  Height: 5\' 5"  (1.651 m)   Body mass index is 44.33 kg/m.  Physical Exam Constitutional: Patient appears well-developed and well-nourished. No distress.  HENT: Head: Normocephalic and atraumatic. Ears: bilateral TMs with no erythema or effusion; Nose: Nose normal. Mouth/Throat: Oropharynx is clear and moist. No oropharyngeal exudate or tonsillar swelling.  Eyes: Conjunctivae and EOM are normal. No scleral icterus.  Pupils are equal, round, and reactive to light.  Neck: Normal range of motion. Neck supple. No JVD present. No thyromegaly present.  Cardiovascular: Normal rate, regular rhythm and normal heart sounds.  No murmur heard. No BLE edema. Pulmonary/Chest: Effort normal and breath sounds normal. No respiratory distress. Abdominal: Soft. Bowel sounds are normal, no distension. There is no tenderness. No masses. Musculoskeletal: Normal range of motion, no joint effusions. No gross deformities Neurological: Pt is alert and oriented to person, place, and time. No cranial nerve deficit. Coordination, balance, strength, speech and gait are normal.  Skin: Skin is warm and dry. No rash noted. No erythema.  Psychiatric: Patient has a normal mood and affect. behavior is normal. Judgment and thought content normal.   No results found for this or any previous visit (from the past 72  hour(s)).  Diabetic Foot Exam: Diabetic Foot Exam - Simple   Simple Foot Form Diabetic Foot exam was performed with the following findings:  Yes 01/26/2019 10:13 AM  Visual Inspection See comments:  Yes Sensation Testing Intact to touch and monofilament testing bilaterally:  Yes Pulse Check Posterior Tibialis and Dorsalis pulse intact bilaterally:  Yes Comments RIGHT great toe with thickening and discoloration; right medial aspect of the heel has some dry scaling skin    PHQ2/9: Depression screen PHQ 2/9 01/26/2019  Decreased Interest 0  Down, Depressed, Hopeless 0  PHQ - 2 Score 0   Fall Risk: Fall Risk  01/26/2019  Falls in the past year? 0  Number falls in past yr: 0  Injury with Fall? 0   Assessment & Plan 1. Type 2 diabetes mellitus with hyperglycemia, without long-term current use of insulin (HCC) - We discussed starting Xultophy in detail today. Pt does not want to start insulin at this time, states she has not been compliant with her Victoza and wants to trial this first.  She will keep a log of fasting daily BG's for 2 weeks, then return with log and we will discuss medication management at that time. - POCT glucose (manual entry) - Urine Microalbumin w/creat. ratio  2. Encounter to establish care  3. Hypothyroidism, unspecified type - Will check next visit. Continue current dose.  4. Rheumatoid arthritis, involving unspecified site, unspecified rheumatoid factor presence (HCC) - Keep follow up with rheumatology  5. Class 3 severe obesity due to excess calories with serious comorbidity and body mass index (BMI) of 40.0 to 44.9 in adult [facility]) - Discussed importance of 150 minutes of physical activity weekly, eat two servings of fish weekly, eat one serving of tree nuts ( cashews, pistachios, pecans, almonds.Marland Kitchen) every other day, eat 6 servings of fruit/vegetables daily and drink plenty of water and avoid sweet beverages.  - On GLP-1  6. Dyslipidemia associated with type  2 diabetes mellitus (HCC) - Last lipid panel done in December and LDL is at goal.

## 2019-01-26 NOTE — Patient Instructions (Addendum)
Take Victoza EVERY DAY. Keep log of your daily fasting blood sugars.  Please have your eye examination performed ASAP and have the information faxed to our office.   Diabetes Mellitus and Nutrition, Adult When you have diabetes (diabetes mellitus), it is very important to have healthy eating habits because your blood sugar (glucose) levels are greatly affected by what you eat and drink. Eating healthy foods in the appropriate amounts, at about the same times every day, can help you:  Control your blood glucose.  Lower your risk of heart disease.  Improve your blood pressure.  Reach or maintain a healthy weight. Every person with diabetes is different, and each person has different needs for a meal plan. Your health care provider may recommend that you work with a diet and nutrition specialist (dietitian) to make a meal plan that is best for you. Your meal plan may vary depending on factors such as:  The calories you need.  The medicines you take.  Your weight.  Your blood glucose, blood pressure, and cholesterol levels.  Your activity level.  Other health conditions you have, such as heart or kidney disease. How do carbohydrates affect me? Carbohydrates, also called carbs, affect your blood glucose level more than any other type of food. Eating carbs naturally raises the amount of glucose in your blood. Carb counting is a method for keeping track of how many carbs you eat. Counting carbs is important to keep your blood glucose at a healthy level, especially if you use insulin or take certain oral diabetes medicines. It is important to know how many carbs you can safely have in each meal. This is different for every person. Your dietitian can help you calculate how many carbs you should have at each meal and for each snack. Foods that contain carbs include:  Bread, cereal, rice, pasta, and crackers.  Potatoes and corn.  Peas, beans, and lentils.  Milk and yogurt.  Fruit and  juice.  Desserts, such as cakes, cookies, ice cream, and candy. How does alcohol affect me? Alcohol can cause a sudden decrease in blood glucose (hypoglycemia), especially if you use insulin or take certain oral diabetes medicines. Hypoglycemia can be a life-threatening condition. Symptoms of hypoglycemia (sleepiness, dizziness, and confusion) are similar to symptoms of having too much alcohol. If your health care provider says that alcohol is safe for you, follow these guidelines:  Limit alcohol intake to no more than 1 drink per day for nonpregnant women and 2 drinks per day for men. One drink equals 12 oz of beer, 5 oz of wine, or 1 oz of hard liquor.  Do not drink on an empty stomach.  Keep yourself hydrated with water, diet soda, or unsweetened iced tea.  Keep in mind that regular soda, juice, and other mixers may contain a lot of sugar and must be counted as carbs. What are tips for following this plan?  Reading food labels  Start by checking the serving size on the "Nutrition Facts" label of packaged foods and drinks. The amount of calories, carbs, fats, and other nutrients listed on the label is based on one serving of the item. Many items contain more than one serving per package.  Check the total grams (g) of carbs in one serving. You can calculate the number of servings of carbs in one serving by dividing the total carbs by 15. For example, if a food has 30 g of total carbs, it would be equal to 2 servings of carbs.  Check the number of grams (g) of saturated and trans fats in one serving. Choose foods that have low or no amount of these fats.  Check the number of milligrams (mg) of salt (sodium) in one serving. Most people should limit total sodium intake to less than 2,300 mg per day.  Always check the nutrition information of foods labeled as "low-fat" or "nonfat". These foods may be higher in added sugar or refined carbs and should be avoided.  Talk to your dietitian to  identify your daily goals for nutrients listed on the label. Shopping  Avoid buying canned, premade, or processed foods. These foods tend to be high in fat, sodium, and added sugar.  Shop around the outside edge of the grocery store. This includes fresh fruits and vegetables, bulk grains, fresh meats, and fresh dairy. Cooking  Use low-heat cooking methods, such as baking, instead of high-heat cooking methods like deep frying.  Cook using healthy oils, such as olive, canola, or sunflower oil.  Avoid cooking with butter, cream, or high-fat meats. Meal planning  Eat meals and snacks regularly, preferably at the same times every day. Avoid going long periods of time without eating.  Eat foods high in fiber, such as fresh fruits, vegetables, beans, and whole grains. Talk to your dietitian about how many servings of carbs you can eat at each meal.  Eat 4-6 ounces (oz) of lean protein each day, such as lean meat, chicken, fish, eggs, or tofu. One oz of lean protein is equal to: ? 1 oz of meat, chicken, or fish. ? 1 egg. ?  cup of tofu.  Eat some foods each day that contain healthy fats, such as avocado, nuts, seeds, and fish. Lifestyle  Check your blood glucose regularly.  Exercise regularly as told by your health care provider. This may include: ? 150 minutes of moderate-intensity or vigorous-intensity exercise each week. This could be brisk walking, biking, or water aerobics. ? Stretching and doing strength exercises, such as yoga or weightlifting, at least 2 times a week.  Take medicines as told by your health care provider.  Do not use any products that contain nicotine or tobacco, such as cigarettes and e-cigarettes. If you need help quitting, ask your health care provider.  Work with a Veterinary surgeon or diabetes educator to identify strategies to manage stress and any emotional and social challenges. Questions to ask a health care provider  Do I need to meet with a diabetes  educator?  Do I need to meet with a dietitian?  What number can I call if I have questions?  When are the best times to check my blood glucose? Where to find more information:  American Diabetes Association: diabetes.org  Academy of Nutrition and Dietetics: www.eatright.[employer] of Diabetes and Digestive and Kidney Diseases (NIH): CarFlippers.tn Summary  A healthy meal plan will help you control your blood glucose and maintain a healthy lifestyle.  Working with a diet and nutrition specialist (dietitian) can help you make a meal plan that is best for you.  Keep in mind that carbohydrates (carbs) and alcohol have immediate effects on your blood glucose levels. It is important to count carbs and to use alcohol carefully. This information is not intended to replace advice given to you by your health care provider. Make sure you discuss any questions you have with your health care provider. Document Released: 09/03/2005 Document Revised: 07/07/2017 Document Reviewed: 01/11/2017 Elsevier Interactive Patient Education  2019 [employer].   Fat and Cholesterol  Restricted Eating Plan Getting too much fat and cholesterol in your diet may cause health problems. Choosing the right foods helps keep your fat and cholesterol at normal levels. This can keep you from getting certain diseases. Your doctor may recommend an eating plan that includes:  Total fat: ______% or less of total calories a day.  Saturated fat: ______% or less of total calories a day.  Cholesterol: less than _________mg a day.  Fiber: ______g a day. What are tips for following this plan? Meal planning  At meals, divide your plate into four equal parts: ? Fill one-half of your plate with vegetables and green salads. ? Fill one-fourth of your plate with whole grains. ? Fill one-fourth of your plate with low-fat (lean) protein foods.  Eat fish that is high in omega-3 fats at least two times a week.  This includes mackerel, tuna, sardines, and salmon.  Eat foods that are high in fiber, such as whole grains, beans, apples, broccoli, carrots, peas, and barley. General tips   Work with your doctor to lose weight if you need to.  Avoid: ? Foods with added sugar. ? Fried foods. ? Foods with partially hydrogenated oils.  Limit alcohol intake to no more than 1 drink a day for nonpregnant women and 2 drinks a day for men. One drink equals 12 oz of beer, 5 oz of wine, or 1 oz of hard liquor. Reading food labels  Check food labels for: ? Trans fats. ? Partially hydrogenated oils. ? Saturated fat (g) in each serving. ? Cholesterol (mg) in each serving. ? Fiber (g) in each serving.  Choose foods with healthy fats, such as: ? Monounsaturated fats. ? Polyunsaturated fats. ? Omega-3 fats.  Choose grain products that have whole grains. Look for the word "whole" as the first word in the ingredient list. Cooking  Cook foods using low-fat methods. These include baking, boiling, grilling, and broiling.  Eat more home-cooked foods. Eat at restaurants and buffets less often.  Avoid cooking using saturated fats, such as butter, cream, palm oil, palm kernel oil, and coconut oil. Recommended foods  Fruits  All fresh, canned (in natural juice), or frozen fruits. Vegetables  Fresh or frozen vegetables (raw, steamed, roasted, or grilled). Green salads. Grains  Whole grains, such as whole wheat or whole grain breads, crackers, cereals, and pasta. Unsweetened oatmeal, bulgur, barley, quinoa, or brown rice. Corn or whole wheat flour tortillas. Meats and other protein foods  Ground beef (85% or leaner), grass-fed beef, or beef trimmed of fat. Skinless chicken or [place]. Ground chicken or [place]. Pork trimmed of fat. All fish and seafood. Egg whites. Dried beans, peas, or lentils. Unsalted nuts or seeds. Unsalted canned beans. Nut butters without added sugar or oil. Dairy  Low-fat or nonfat  dairy products, such as skim or 1% milk, 2% or reduced-fat cheeses, low-fat and fat-free ricotta or cottage cheese, or plain low-fat and nonfat yogurt. Fats and oils  Tub margarine without trans fats. Light or reduced-fat mayonnaise and salad dressings. Avocado. Olive, canola, sesame, or safflower oils. The items listed above may not be a complete list of foods and beverages you can eat. Contact a dietitian for more information. Foods to avoid Fruits  Canned fruit in heavy syrup. Fruit in cream or butter sauce. Fried fruit. Vegetables  Vegetables cooked in cheese, cream, or butter sauce. Fried vegetables. Grains  White bread. White pasta. White rice. Cornbread. Bagels, pastries, and croissants. Crackers and snack foods that contain trans fat and hydrogenated  oils. Meats and other protein foods  Fatty cuts of meat. Ribs, chicken wings, bacon, sausage, bologna, salami, chitterlings, fatback, hot dogs, bratwurst, and packaged lunch meats. Liver and organ meats. Whole eggs and egg yolks. Chicken and [place] with skin. Fried meat. Dairy  Whole or 2% milk, cream, half-and-half, and cream cheese. Whole milk cheeses. Whole-fat or sweetened yogurt. Full-fat cheeses. Nondairy creamers and whipped toppings. Processed cheese, cheese spreads, and cheese curds. Beverages  Alcohol. Sugar-sweetened drinks such as sodas, lemonade, and fruit drinks. Fats and oils  Butter, stick margarine, lard, shortening, ghee, or bacon fat. Coconut, palm kernel, and palm oils. Sweets and desserts  Corn syrup, sugars, honey, and molasses. Candy. Jam and jelly. Syrup. Sweetened cereals. Cookies, pies, cakes, donuts, muffins, and ice cream. The items listed above may not be a complete list of foods and beverages you should avoid. Contact a dietitian for more information. Summary  Choosing the right foods helps keep your fat and cholesterol at normal levels. This can keep you from getting certain diseases.  At meals,  fill one-half of your plate with vegetables and green salads.  Eat high-fiber foods, like whole grains, beans, apples, carrots, peas, and barley.  Limit added sugar, saturated fats, alcohol, and fried foods. This information is not intended to replace advice given to you by your health care provider. Make sure you discuss any questions you have with your health care provider. Document Released: 06/07/2012 Document Revised: 08/10/2018 Document Reviewed: 08/24/2017 Elsevier Interactive Patient Education  2019 [employer].

## 2019-01-27 LAB — MICROALBUMIN / CREATININE URINE RATIO
Creatinine, Urine: 157 mg/dL (ref 20–275)
Microalb Creat Ratio: 11 mcg/mg creat (ref <30)
Microalb, Ur: 1.7 mg/dL

## 2019-01-31 ENCOUNTER — Telehealth: Payer: Self-pay | Admitting: Pharmacy Technician

## 2019-01-31 NOTE — Telephone Encounter (Signed)
Patient stated that she thought she had health insurance, but has recently discovered that she enrolled after open enrollment.  Therefore, her health insurance is not active for 2020.   Sherilyn Dacosta Care Manager [facility]

## 2019-02-09 ENCOUNTER — Other Ambulatory Visit: Payer: Self-pay

## 2019-02-09 ENCOUNTER — Encounter: Payer: Self-pay | Admitting: Family Medicine

## 2019-02-09 ENCOUNTER — Ambulatory Visit (INDEPENDENT_AMBULATORY_CARE_PROVIDER_SITE_OTHER): Payer: Self-pay | Admitting: Family Medicine

## 2019-02-09 ENCOUNTER — Ambulatory Visit: Payer: Self-pay

## 2019-02-09 DIAGNOSIS — M069 Rheumatoid arthritis, unspecified: Secondary | ICD-10-CM

## 2019-02-09 DIAGNOSIS — E1165 Type 2 diabetes mellitus with hyperglycemia: Secondary | ICD-10-CM

## 2019-02-09 DIAGNOSIS — Z6841 Body Mass Index (BMI) 40.0 and over, adult: Secondary | ICD-10-CM

## 2019-02-09 MED ORDER — EMPAGLIFLOZIN 10 MG PO TABS: 10.0000 mg | ORAL_TABLET | Freq: Every day | ORAL | 1 refills | Status: DC

## 2019-02-09 NOTE — Progress Notes (Signed)
Established Patient Office Visit  Subjective:  Patient ID: Ashley Cochran, female    DOB: Jan 01, 1976  Age: 43 y.o. MRN: [id number]  CC:  Chief Complaint  Patient presents with  . Follow-up  . Diabetes    2 week recheck  . Hand Pain    right hand    HPI Ashley Cochran presents for 2 week follow up:  DM: We discussed starting Xultophy in detail at last visit, she brings log of BG's today that shows low of 167, highest 249, average of about 180's. She has been compliant with her Victoza since last visit, and BG's are improving slightly.  We will add Jardiance and will look into adding Xultophy with the CCM team helping with affordability. Endorses polydipsia, denies polyphagia or polyuria, no neuropathy.   Right Hand Pain/RA:  Is supposed to be taking Enbrel, but has been out since December due to lack of insurance.  Sees Dr. Ilsa Iha in [place], and next appointment is 02/15/2019 today. She has been having more flares lately, and has been having ongoing RIGHT hand pain and swelling.  Today she has right hand swelling and tenderness. Weight is stable today. Is due for eye examination - has not scheduled but will do so.  Lack of Insurance: She just found out that she will not have insurance this year and has to wait until November to re-enroll.  We will refer to CCM team for assistance with medication management.  She does not want to use [facility] at this time.   Past Medical History:  Diagnosis Date  . Arthritis    Rheumatoid Arthritis  . Asthma   . Diabetes mellitus without complication (HCC)   . GERD (gastroesophageal reflux disease)   . Kidney stones   . Thyroid disease     No past surgical history on file.  Family History  Problem Relation Age of Onset  . Hypertension Mother   . Diabetes Mother        type 2  . Asthma Mother   . Stroke Mother        Mini strokes  . GER disease Mother   . Hypertension Father   . Diabetes Father        Type 2  . Asthma Father   . GER  disease Father   . Asthma Sister   . Allergies Sister   . Asthma Brother   . Allergies Brother     Social History   Socioeconomic History  . Marital status: Legally Separated    Spouse name: Not on file  . Number of children: Not on file  . Years of education: Not on file  . Highest education level: Not on file  Occupational History  . Not on file  Social Needs  . Financial resource strain: Somewhat hard  . Food insecurity:    Worry: Never true    Inability: Never true  . Transportation needs:    Medical: No    Non-medical: No  Tobacco Use  . Smoking status: Former Smoker    Last attempt to quit: 09/20/2004    Years since quitting: 14.3  . Smokeless tobacco: Never Used  Substance and Sexual Activity  . Alcohol use: No  . Drug use: No  . Sexual activity: Not on file  Lifestyle  . Physical activity:    Days per week: 0 days    Minutes per session: 0 min  . Stress: To some extent  Relationships  . Social connections:  Talks on phone: More than three times a week    Gets together: Once a week    Attends religious service: Never    Active member of club or organization: No    Attends meetings of clubs or organizations: Never    Relationship status: Separated  . Intimate partner violence:    Fear of current or ex partner: No    Emotionally abused: No    Physically abused: No    Forced sexual activity: No  Other Topics Concern  . Not on file  Social History Narrative  . Not on file    Outpatient Medications Prior to Visit  Medication Sig Dispense Refill  . albuterol (PROVENTIL HFA;VENTOLIN HFA) 108 (90 Base) MCG/ACT inhaler Inhale 2 puffs into the lungs every 6 (six) hours as needed for wheezing or shortness of breath. 3 Inhaler 4  . aspirin-acetaminophen-caffeine (EXCEDRIN MIGRAINE) 250-250-65 MG tablet Take by mouth.    . baclofen (LIORESAL) 20 MG tablet Take 20 mg by mouth.    . cyclobenzaprine (FLEXERIL) 10 MG tablet Take 10 mg by mouth as needed for  muscle spasms (every 8 hours as needed for spasms.).    Marland Kitchen. Etanercept 50 MG/ML SOCT Inject 1 Dose into the skin once a week.    . Fluticasone-Salmeterol (ADVAIR DISKUS) 250-50 MCG/DOSE AEPB Inhale 1 puff into the lungs 2 (two) times daily. 180 each 4  . levothyroxine (SYNTHROID, LEVOTHROID) 175 MCG tablet Take daily 30 tablet 4  . liraglutide (VICTOZA) 18 MG/3ML SOPN Inject 0.3 mLs (1.8 mg total) into the skin daily. 36 mL 3  . meclizine (ANTIVERT) 25 MG tablet Take 1 tablet (25 mg total) by mouth 3 (three) times daily as needed for dizziness. 30 tablet 0  . montelukast (SINGULAIR) 10 MG tablet Take 1 tablet (10 mg total) by mouth daily. 90 tablet 1  . naproxen (NAPROSYN) 500 MG tablet Take 0.5 tablets (250 mg total) by mouth 3 (three) times daily as needed. 200 tablet 4  . omeprazole (PRILOSEC) 20 MG capsule Take 1 capsule (20 mg total) by mouth daily. 30 capsule 3  . ondansetron (ZOFRAN) 4 MG tablet Take 1 tablet (4 mg total) by mouth 2 (two) times daily. 20 tablet 1  . OVER THE COUNTER MEDICATION Apply 1 application topically daily. Triderma MD    . traZODone (DESYREL) 50 MG tablet Take 1 tablet (50 mg total) by mouth at bedtime. 90 tablet 4  . triamcinolone (NASACORT) 55 MCG/ACT AERO nasal inhaler Place 2 sprays into the nose daily. 3 Inhaler 4   No facility-administered medications prior to visit.     Allergies  Allergen Reactions  . Nitrofuran Derivatives Rash    jittery  . Latex   . Strawberry Flavor   . Tape Rash    ROS Review of Systems  Constitutional: Negative.  Negative for chills, fatigue, fever and unexpected weight change.  HENT: Negative.  Negative for congestion and sore throat.   Respiratory: Negative for cough and shortness of breath.   Cardiovascular: Negative for chest pain, palpitations and leg swelling.  Gastrointestinal: Negative for abdominal pain, constipation, diarrhea, nausea and vomiting.  Endocrine: Positive for polydipsia. Negative for polyphagia and  polyuria.  Genitourinary: Negative.   Musculoskeletal: Positive for arthralgias and joint swelling (RIGHT hand - right index and middle fingers).  Skin: Negative.   Neurological: Negative for light-headedness and headaches.  Hematological: Negative.   Psychiatric/Behavioral: Negative.   All other systems reviewed and are negative.    Objective:  Physical Exam  Constitutional: She is oriented to person, place, and time. She appears well-developed and well-nourished. No distress.  HENT:  Head: Normocephalic and atraumatic.  Right Ear: External ear normal.  Left Ear: External ear normal.  Nose: Nose normal.  Mouth/Throat: Oropharynx is clear and moist. No oropharyngeal exudate.  Eyes: Pupils are equal, round, and reactive to light. Conjunctivae and EOM are normal.  Neck: Normal range of motion. Neck supple. No JVD present. No thyromegaly present.  Cardiovascular: Normal rate and regular rhythm. Exam reveals no gallop and no friction rub.  No murmur heard. Pulmonary/Chest: Breath sounds normal. She has no wheezes. She has no rales. She exhibits no tenderness.  Musculoskeletal: Normal range of motion.        General: No edema.     Right hand: She exhibits tenderness and swelling. Normal sensation noted. Decreased strength noted.       Hands:  Lymphadenopathy:    She has no cervical adenopathy.  Neurological: She is alert and oriented to person, place, and time. No cranial nerve deficit.  Skin: Skin is warm and dry. No rash noted.  Psychiatric: She has a normal mood and affect. Her behavior is normal. Judgment and thought content normal.  Nursing note and vitals reviewed.  BP 130/68 (BP Location: Left Arm, Patient Position: Sitting, Cuff Size: Large)   Pulse 97   Temp 98 F (36.7 C) (Oral)   Resp 16   Ht  (1.651 m)   Wt 266 lb 1.6 oz (120.7 kg)   LMP 01/15/2019   SpO2 98%   BMI 44.28 kg/m  Wt Readings from Last 3 Encounters:  02/09/19 266 lb 1.6 oz (120.7 kg)    01/26/19 266 lb 6.4 oz (120.8 kg)  12/08/18 277 lb 12.8 oz (126 kg)   Health Maintenance Due  Topic Date Due  . OPHTHALMOLOGY EXAM  12/07/1984  . HIV Screening  12/07/1989  . PAP SMEAR-Modifier  12/08/1995    There are no preventive care reminders to display for this patient.  Lab Results  Component Value Date   TSH 1.260 09/01/2018   Lab Results  Component Value Date   WBC 8.7 07/22/2018   HGB 12.7 07/22/2018   HCT 37.2 07/22/2018   MCV 80.8 07/22/2018   PLT 252 07/22/2018   Lab Results  Component Value Date   NA 135 12/08/2018   K 4.4 12/08/2018   CO2 23 12/08/2018   GLUCOSE 393 (H) 12/08/2018   BUN 12 12/08/2018   CREATININE 0.65 12/08/2018   BILITOT 0.2 12/08/2018   ALKPHOS 124 (H) 12/08/2018   AST 33 12/08/2018   ALT 34 (H) 12/08/2018   PROT 7.5 12/08/2018   ALBUMIN 3.8 12/08/2018   CALCIUM 9.0 12/08/2018   ANIONGAP 6 07/22/2018   Lab Results  Component Value Date   CHOL 147 12/08/2018   Lab Results  Component Value Date   HDL 33 (L) 12/08/2018   Lab Results  Component Value Date   LDLCALC 64 12/08/2018   Lab Results  Component Value Date   TRIG 248 (H) 12/08/2018   Lab Results  Component Value Date   CHOLHDL 4.5 (H) 12/08/2018   Lab Results  Component Value Date   HGBA1C 9.5 (H) 12/08/2018      Assessment & Plan:   Problem List Items Addressed This Visit      Endocrine   Type 2 diabetes mellitus with hyperglycemia, without long-term current use of insulin (HCC)    Add jardiance today  Relevant Medications   empagliflozin (JARDIANCE) 10 MG TABS tablet   Other Relevant Orders   Ambulatory referral to Chronic Care Management Services     Musculoskeletal and Integument   Rheumatoid arthritis [facility])    Seeing Rheumatology on 02/15/2019      Relevant Orders   Ambulatory referral to Chronic Care Management Services     Other   Class 3 severe obesity due to excess calories with serious comorbidity and body mass index (BMI) of  40.0 to 44.9 in adult [facility])    Discussed importance of 150 minutes of physical activity weekly, eat two servings of fish weekly, eat one serving of tree nuts ( cashews, pistachios, pecans, almonds.Marland Kitchen) every other day, eat 6 servings of fruit/vegetables daily and drink plenty of water and avoid sweet beverages.        Relevant Medications   empagliflozin (JARDIANCE) 10 MG TABS tablet   Other Relevant Orders   Ambulatory referral to Chronic Care Management Services      Meds ordered this encounter  Medications  . empagliflozin (JARDIANCE) 10 MG TABS tablet    Sig: Take 10 mg by mouth daily.    Dispense:  90 tablet    Refill:  1    Order Specific Question:   Supervising Provider    Answer:   Alba Cory [[id number]]    Follow-up: Return in about 1 month (around 03/10/2019) for CPE w/ Pap + Follow Up - appt.    Doren Custard, FNP

## 2019-02-09 NOTE — Assessment & Plan Note (Signed)
Add jardiance today

## 2019-02-09 NOTE — Patient Instructions (Signed)
1. Thank You for allowing the CCM (Chronic Care Management) Team to assist you with your healthcare goals!! We look forward to meeting you on 03/02/2019 at 9:00. 2. Please bring ALL medications to your appointment! If you have a blood sugar meter or a blood pressure monitor at home, bring those as well.  3.  Contact the CCM Team if you have any question or need to reschedule your initial visit.  CCM (Chronic Care Management) Team   Yvone Neu RN, BSN Nurse Care Coordinator  [phone]  Karalee Height PharmD  Clinical Pharmacist  [phone]   Ms. Conca was given information about Care Management services today including:  1. Case Management services includes personalized support from designated clinical staff supervised by her physician, including individualized plan of care and coordination with other care providers 2. 24/7 contact phone numbers for assistance for urgent and routine care needs. 3. The patient may stop case management services at any time by phone call to the office staff.  Patient agreed to services and verbal consent obtained.

## 2019-02-09 NOTE — Assessment & Plan Note (Signed)
Seeing Rheumatology on 02/15/2019

## 2019-02-09 NOTE — Assessment & Plan Note (Signed)
Discussed importance of 150 minutes of physical activity weekly, eat two servings of fish weekly, eat one serving of tree nuts ( cashews, pistachios, pecans, almonds..) every other day, eat 6 servings of fruit/vegetables daily and drink plenty of water and avoid sweet beverages. 

## 2019-02-09 NOTE — Chronic Care Management (AMB) (Signed)
   Care Management   Note  02/09/2019 Name: Aubery Date MRN: [id number] DOB: 1976-10-22  Ashley Cochran is a 43 year old female patient who sees Raelyn Ensign, FNP for primary care. Ashley Cochran asked the CCM team to consult the patient for diabetes management and medication assistance as patient is uninsurred. Patient has a history of but not limited to Hypothyroidism, DM2, and RA. Referral was placed 02/09/2019. Patient's last office visit was 02/09/2019. CCM RN CM met with Ashley Cochran today to introduce CCM services.  Ashley Cochran was given information about Chronic Care Management services today including:  1. CCM service includes personalized support from designated clinical staff supervised by her physician, including individualized plan of care and coordination with other care providers 2. 24/7 contact phone numbers for assistance for urgent and routine care needs. 3. Service will only be billed when office clinical staff spend 20 minutes or more in a month to coordinate care. 4. Only one practitioner may furnish and bill the service in a calendar month. 5. The patient may stop CCM services at any time (effective at the end of the month) by phone call to the office staff. 6. The patient will be responsible for cost sharing (co-pay) of up to 20% of the service fee (after annual deductible is met).  Patient agreed to services and verbal consent obtained.      Plan: The CCM Team will meet with Ashley Cochran face to face 03/02/2019 at 9:00    [place]. Rollene Rotunda, RN, BSN Nurse Care Coordinator [facility] / [facility] Care Management  [phone]

## 2019-02-15 ENCOUNTER — Telehealth: Payer: Self-pay | Admitting: Pharmacist

## 2019-02-15 ENCOUNTER — Ambulatory Visit: Admit: 2019-02-15 | Discharge: 2019-02-15

## 2019-02-15 DIAGNOSIS — M059 Rheumatoid arthritis with rheumatoid factor, unspecified: Principal | ICD-10-CM

## 2019-02-15 MED ORDER — ETANERCEPT 50 MG/ML (1 ML) SUBCUTANEOUS PEN INJECTOR
PEN_INJECTOR | SUBCUTANEOUS | 3 refills | 0 days | Status: CP
Start: 2019-02-15 — End: ?

## 2019-02-15 NOTE — Telephone Encounter (Signed)
02/15/2019 10:30:18 AM - Advair & Ventolin GSK renewal  02/15/2019 I have printed GSK renewal application put patient portion in bag of meds up front for patient to sign, sending scripts for Ventolin HFA Inhale 2 puffs into the lungs every six hours as needed for wheezing or shortness of breath & Advair 250/50 Inhale 1 puff two times a day, rinse mouth and spit after each use to [facility] for provider to sign. [name]

## 2019-02-23 ENCOUNTER — Encounter: Payer: Self-pay | Admitting: Pharmacist

## 2019-03-01 ENCOUNTER — Telehealth: Payer: Self-pay | Admitting: Pharmacist

## 2019-03-01 NOTE — Telephone Encounter (Signed)
03/01/2019 12:34:31 PM - Ventolin & Advair pending  03/01/2019 I have received the signed scripts for Ventolin HFA Inhale 2 puffs into the lungs every six hours as needed for wheezing or shortness of breath & Advair 250/50 Inhale 1 puff two times a day, rinse mouth and spit after each use. I am holding for patient to sign her portion of GSK application for renewal-also need current 2020 income and taxes when she files.AJ   03/01/2019 12:32:24 PM - Jardiance Pending  03/01/2019 I have received the signed provider portion of [employer] application for Jardiance 10mg  Take 1 tablet by mouth everyday. Unable to order until patient returns her portion that was mailed to her to sign 02/09/2019 and return with current 2020 income and taxes when she files.[name]

## 2019-03-02 ENCOUNTER — Ambulatory Visit: Payer: Self-pay

## 2019-03-02 ENCOUNTER — Ambulatory Visit: Payer: Self-pay | Admitting: Pharmacist

## 2019-03-02 DIAGNOSIS — E1165 Type 2 diabetes mellitus with hyperglycemia: Secondary | ICD-10-CM

## 2019-03-02 DIAGNOSIS — Z6841 Body Mass Index (BMI) 40.0 and over, adult: Secondary | ICD-10-CM

## 2019-03-02 DIAGNOSIS — M069 Rheumatoid arthritis, unspecified: Secondary | ICD-10-CM

## 2019-03-02 NOTE — Patient Instructions (Addendum)
  Thank you allowing the Chronic Care Management Team to be a part of your care! It was a pleasure speaking with you today!  1. Please take all medications as prescribed and attend all scheduled medical appointments 2. Please review written materials provided today regarding diet and exercise 3. Please notify CCM RN CM or CCM Clinic Pharmacist if you are unable to obtain your medications. 4. CCM RN CM will follow up with you in 2 weeks via telephone 5. Please remain engaged with [facility] in order to continue to recieve assistance with your medications.  CCM (Chronic Care Management) Team   Yvone Neu RN, BSN Nurse Care Coordinator  [phone]  Karalee Height PharmD  Clinical Pharmacist  [phone]   Verna Czech, LCSW Clinical Social Worker [phone]  Goals Addressed            This Visit's Progress   . I have had diabetes education but only because I went with my family who had diabetes (pt-stated)       Current Barriers:  Marland Kitchen Knowledge Deficits related to basic Diabetes pathophysiology and self care/management . Corporate treasurer . Knowledge Deficits related to medications used for management of diabetes  Nurse Case Manager Clinical Goal(s):  Over the next 14 days, patient will demonstrate improved adherence to prescribed treatment plan for diabetes self care/management as evidenced by:  . adherence to ADA/ carb modified diet . exercise 7 days/week . adherence to prescribed medication regimen  Interventions:  . Provided verbal and EMMI education to patient about basic DM disease process . Reviewed medications with patient and discussed importance of medication adherence . Discussed plans with patient for ongoing care management follow up and provided patient with direct contact information for care management team  Patient Self Care Activities:  . Self administers oral medications as prescribed . Attends all scheduled provider appointments . Adheres to  prescribed ADA/carb modified  Initial goal documentation       Ms. Albaugh was given information about Care Management services today including:  1. Case Management services includes personalized support from designated clinical staff supervised by her physician, including individualized plan of care and coordination with other care providers 2. 24/7 contact phone numbers for assistance for urgent and routine care needs. 3. The patient may stop case management services at any time by phone call to the office staff.  Patient agreed to services and verbal consent obtained.   Print copy of patient instructions provided.   Telephone follow up appointment with CCM team member scheduled for: 2 weeks

## 2019-03-02 NOTE — Chronic Care Management (AMB) (Signed)
Chronic Care Management   Note  03/02/2019 Name: Ashley Cochran MRN: [id number] DOB: [birth year]  Subjective:   Does the patient  feel that his/her medications are working for him/her?  yes  Has the patient been experiencing any side effects to the medications prescribed?  no  Does the patient measure his/her own blood glucose at home?  no   Does the patient measure his/her own blood pressure at home? no   Does the patient have any problems obtaining medications due to transportation or finances?   Yes- working with [facility] Med Management clinic  Understanding of regimen: fair Understanding of indications: fair Potential of compliance: fair   Objective: Lab Results  Component Value Date   CREATININE 0.65 12/08/2018   CREATININE 0.65 07/26/2018   CREATININE 0.54 07/22/2018    Lab Results  Component Value Date   HGBA1C 9.5 (H) 12/08/2018    Lipid Panel     Component Value Date/Time   CHOL 147 12/08/2018 1920   TRIG 248 (H) 12/08/2018 1920   HDL 33 (L) 12/08/2018 1920   CHOLHDL 4.5 (H) 12/08/2018 1920   LDLCALC 64 12/08/2018 1920    BP Readings from Last 3 Encounters:  02/09/19 130/68  01/26/19 116/76  12/08/18 115/76    Allergies  Allergen Reactions  . Nitrofuran Derivatives Rash    jittery  . Latex   . Strawberry Flavor   . Tape Rash    Medications Reviewed Today    Reviewed by Riesa Pope, RMA (Registered Medical Assistant) on 02/09/19 at [id number]  Med List Status: <None>  Medication Order Taking? Sig Documenting Provider Last Dose Status Informant  albuterol (PROVENTIL HFA;VENTOLIN HFA) 108 (90 Base) MCG/ACT inhaler [id number] Yes Inhale 2 puffs into the lungs every 6 (six) hours as needed for wheezing or shortness of breath. Maryjo Rochester, MD Taking Active   aspirin-acetaminophen-caffeine [facility] MIGRAINE) [phone] MG tablet [id number] Yes Take by mouth. [provider] Taking Active            Med Note Leonor Liv, CHRISTAN T   Thu Feb 11, 2017  11:23 AM) Only as needed  baclofen (LIORESAL) 20 MG tablet [id number] Yes Take 20 mg by mouth. [provider] Taking Active            Med Note Donnetta Hutching Dec 08, 2016  6:51 PM) Received from: [facility] Received Sig: Take 20 mg by mouth 4 (four) times a day as needed (spasms).  cyclobenzaprine (FLEXERIL) 10 MG tablet [id number] Yes Take 10 mg by mouth as needed for muscle spasms (every 8 hours as needed for spasms.). [provider] Taking Active   Etanercept 50 MG/ML SOCT [id number] Yes Inject 1 Dose into the skin once a week. [provider] Taking Active            Med Note Kirk Ruths Dec 08, 2018  6:43 PM) Needs refill  Fluticasone-Salmeterol (ADVAIR DISKUS) 250-50 MCG/DOSE AEPB [id number] Yes Inhale 1 puff into the lungs 2 (two) times daily. Maryjo Rochester, MD Taking Active            Med Note Memory Dance, Tresa Endo M   Tue Mar 09, 2017  6:47 PM) Taking sometimes  levothyroxine (SYNTHROID, LEVOTHROID) 175 MCG tablet [id number] Yes Take daily Doles-Johnson, Teah, NP Taking Active   liraglutide (VICTOZA) 18 MG/3ML SOPN [id number] Yes Inject 0.3 mLs (1.8 mg total) into the skin daily. Doles-Johnson, Teah, NP Taking Active   meclizine (ANTIVERT) 25  MG tablet [id number] Yes Take 1 tablet (25 mg total) by mouth 3 (three) times daily as needed for dizziness. Harle BattiestMcGowan, Shannon A, PA-C Taking Active            Med Note Excell Seltzer(BAKER, JENNA K   Tue Oct 11, 2018  6:05 PM) Needs refill  montelukast (SINGULAIR) 10 MG tablet [id number] Yes Take 1 tablet (10 mg total) by mouth daily. Doles-Johnson, Teah, NP Taking Active   naproxen (NAPROSYN) 500 MG tablet [id number] Yes Take 0.5 tablets (250 mg total) by mouth 3 (three) times daily as needed. Doles-Johnson, Teah, NP Taking Active   omeprazole (PRILOSEC) 20 MG capsule [id number] Yes Take 1 capsule (20 mg total) by mouth daily. Harle BattiestMcGowan, Shannon A, PA-C Taking Active   ondansetron (ZOFRAN) 4 MG tablet [id number] Yes Take 1 tablet  (4 mg total) by mouth 2 (two) times daily. Maryjo RochesterBuse, John B, MD Taking Active            Med Note Memory Dance(FUHRMANN, Tresa EndoKELLY M   Tue Mar 09, 2017  6:49 PM) As needed  OVER THE COUNTER MEDICATION [id number] Yes Apply 1 application topically daily. Triderma MD [provider] Taking Active            Med Note Memory Dance(FUHRMANN, Tresa EndoKELLY M   Tue Mar 09, 2017  6:49 PM) Psoriasis Cream   traZODone (DESYREL) 50 MG tablet [id number] Yes Take 1 tablet (50 mg total) by mouth at bedtime. Maryjo RochesterBuse, John B, MD Taking Active            Med Note Sharen Heck(BAKER, JENNA K   Tue Oct 11, 2018  6:06 PM) Needs refills  triamcinolone (NASACORT) 55 MCG/ACT AERO nasal inhaler [id number] Yes Place 2 sprays into the nose daily. Maryjo RochesterBuse, John B, MD Taking Active            Med Note Memory Dance(FUHRMANN, Tresa EndoKELLY M   Tue Mar 09, 2017  6:49 PM) As needed          Assessment:   Indications for all medications: [x]  Yes       []  No  Adherence Review  []  Excellent (no doses missed/week)     []  Good (no more than 1 dose missed/week)     [x]  Partial (2-3 doses missed/week)     []  Poor (>3 doses missed/week)  Intervention  YES NO  Explanation   Adherence      Cannot afford medication [x]  []  [facility]   Cannot self-administer medication appropriately []  []     Does not understand directions [x]  []  Uses albuterol PRN   Prefers not to take [x]  []  Doesn't inject 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 I wasn't taking my medications becasue I was forgetting (pt-stated)       Current Barriers:  Marland Kitchen. Knowledge Deficits related to medication use  Pharmacist Clinical Goal(s):  Marland Kitchen. Over the next 14 days, patient will demonstrate improved understanding of prescribed medications and rationale for usage as evidenced by 100%  adherence  . 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   Interventions: . Comprehensive medication review performed. . Advised patient to take all medications as prescribed . Discussed plans with patient for ongoing care management follow up and provided patient with direct contact information for care management team  . Patient educated on purpose, proper use and potential adverse effects of Enbrel, Victoza, Advair, albuterol rescue   Patient Self Care Activities:  . Self administers medications as prescribed  Initial goal documentation        Time:  Time spent counseling patient: 60 Additional time spent on charting: 10 Review of patient status, including review of consultants reports, laboratory and other test data, was performed as part of comprehensive evaluation and provision of chronic care management services.   Plan: Recommendations discussed with provider - DC Advair   Recommendations discussed with patient - use albuterol inhaler PRN for wheezing and shortness of breath- instead of Advair  Follow up: Telephone follow up appointment with CCM team member scheduled for: 2 weeks with PharmD (telephone) The patient has been provided with contact information for the chronic care management team and has been advised to call with any health related questions or concerns.   Karalee Height, PharmD Clinical Pharmacist [facility] [phone]

## 2019-03-02 NOTE — Chronic Care Management (AMB) (Signed)
Chronic Care Management   Initial Visit Note  03/03/2019 Name: Ashley Cochran MRN: [id number] DOB: 20-May-1976  Subjective: "My sugars are not controlled because I was not taking my medication" "I do not want to go to [facility]. I will pay for my visits with [name] until I can get insurance"  Objective:  Lab Results  Component Value Date   HGBA1C 9.5 (H) 12/08/2018    Assessment: Ashley Cochran is a 43 year old female patientwho sees Raelyn Ensign, FNP for primary care. [name] the CCM team to consult the patient for diabetes management and medication assistance as patient is uninsurred. Patient has a history of but not limited to Hypothyroidism, DM2, and RA. Referral was placed2/20/2020. Patient's last office visit was 02/09/2019. Today, CCM RN CM met with [name] to establish health goals which includes education related to DM self care.  [name] is currently employed full time at [employer] however she missed the insurance enrollment period therefore she will remain uninsured until open enrollment closer to the end of this year. She is obtaining all medications through [facility]. She has been offered referral to [facility] however she wishes to remain a cash paying patient to this practice.   Goals Addressed            This Visit's Progress   . 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 adherence to ADA/ carb modified diet . exercise 7 days/week . adherence to prescribed  medication regimen  Interventions:  . Provided verbal and EMMI education to patient about basic DM disease process . Reviewed medications with patient and discussed importance of medication adherence . Discussed plans with patient for ongoing care management follow up and provided patient with direct contact information for care management team  Patient Self Care Activities:  . Self administers oral medications as prescribed . Attends all scheduled provider appointments . Adheres to prescribed ADA/carb modified  Initial goal documentation         Follow up plan:  Telephone follow up appointment with CCM team member scheduled for: 2 weeks   [name] was given information about Chronic Care Management services today including:  1. CCM service includes personalized support from designated clinical staff supervised by her physician, including individualized plan of care and coordination with other care providers 2. 24/7 contact phone numbers for assistance for urgent and routine care needs. 3. Service will only be billed when office clinical staff spend 20 minutes or more in a month to coordinate care. 4. Only one practitioner may furnish and bill the service in a calendar month. 5. The patient may stop CCM services at any time (effective at the end of the month) by phone call to the office staff. 6. The patient will be responsible for cost sharing (co-pay) of up to 20% of the service fee (after annual deductible is met).  Patient agreed to services and verbal consent obtained.    Aundrea Higginbotham E. Rollene Rotunda, RN, BSN Nurse Care Coordinator [facility] / [facility] Care Management  [phone]

## 2019-03-02 NOTE — Patient Instructions (Addendum)
1. Stop taking Advair and use rescue inhaler for shortness of breath and wheezing!    Thank you allowing the Chronic Care Management Team to be a part of your care!   Please call a member of the CCM (Chronic Care Management) Team with any questions or case management needs:   Arthur Holms, BSN Nurse Care Coordinator  [phone]  Karalee Height, PharmD  Clinical Pharmacist  [phone]  . Goals Addressed            This Visit's Progress   . I wasn't taking my medications becasue I was forgetting (pt-stated)       Current Barriers:  Marland Kitchen Knowledge Deficits related to medication use  Pharmacist Clinical Goal(s):  Marland Kitchen Over the next 14 days, patient will demonstrate improved understanding of prescribed medications and rationale for usage as evidenced by 100% adherence  . 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   Interventions: . Comprehensive medication review performed. . Advised patient to take all medications as prescribed . Discussed plans with patient for ongoing care management follow up and provided patient with direct contact information for care management team  . Patient educated on purpose, proper use and potential adverse effects of Enbrel, Victoza, Advair, albuterol rescue   Patient Self Care Activities:  . Self administers medications as prescribed  Initial goal documentation        The patient verbalized understanding of instructions provided today and declined a print copy of patient instruction materials.

## 2019-03-07 ENCOUNTER — Telehealth: Payer: Self-pay | Admitting: Pharmacist

## 2019-03-07 NOTE — Telephone Encounter (Signed)
03/07/2019 10:21:27 AM - Victoza Pen & Novofine 32G tips refill  03/07/2019 Printed [employer] refill request for Victoza Pen Inject 1.8 mg daily & Novofine 32G tips-sending to [facility] for provider to sign.[name]

## 2019-03-08 ENCOUNTER — Encounter (INDEPENDENT_AMBULATORY_CARE_PROVIDER_SITE_OTHER): Payer: Self-pay

## 2019-03-09 ENCOUNTER — Encounter: Payer: Self-pay | Admitting: Family Medicine

## 2019-03-09 ENCOUNTER — Telehealth: Payer: Self-pay | Admitting: Pharmacist

## 2019-03-09 ENCOUNTER — Ambulatory Visit: Payer: Self-pay

## 2019-03-09 NOTE — Telephone Encounter (Signed)
Attempted to call patient to give the option to do an outreach call rather than come in person for MTM. Did not answer, and voicemail box full, so was unable to leave message.   Mauri Reading, PharmD Pharmacy Resident  03/09/2019 2:20 PM

## 2019-03-16 ENCOUNTER — Other Ambulatory Visit: Payer: Self-pay

## 2019-03-16 ENCOUNTER — Ambulatory Visit: Payer: Self-pay

## 2019-03-16 ENCOUNTER — Ambulatory Visit: Payer: Self-pay | Admitting: Pharmacist

## 2019-03-16 DIAGNOSIS — Z6841 Body Mass Index (BMI) 40.0 and over, adult: Secondary | ICD-10-CM

## 2019-03-16 DIAGNOSIS — M069 Rheumatoid arthritis, unspecified: Secondary | ICD-10-CM

## 2019-03-16 DIAGNOSIS — Z79899 Other long term (current) drug therapy: Secondary | ICD-10-CM

## 2019-03-16 DIAGNOSIS — E1165 Type 2 diabetes mellitus with hyperglycemia: Secondary | ICD-10-CM

## 2019-03-16 NOTE — Chronic Care Management (AMB) (Deleted)
  Care Management   Follow Up Note   03/16/2019 Name: Ashley Cochran MRN: [id number] DOB: [birth year]  Referred by: Hubbard Hartshorn, FNP Reason for referral : No chief complaint on file.    Subjective: "I am doing doing good with staying on the diabetic diet we talked about"   Objective:  Lab Results  Component Value Date   HGBA1C 9.5 (H) 12/08/2018     Assessment: Ashley Cochran a 43year old female patientwho seesEmily Uvaldo Cochran, FNPfor primary care. Ashley Cochran the CCM team to consult the patient fordiabetes management and medication assistance as patient is uninsurred. Patient has a history of but not limited toHypothyroidism, DM2, and RA. Referral was placed2/20/2020. Patient's last office visit was2/20/2020. CCM RN CM met with Ashley Cochran to establish health goals which includes education related to DM self care on 03/02/2019. Today, CCM RN CM followed up with Ashley Cochran to assess for progression towards goals.    Review of patient status, including review of consultants reports, relevant laboratory and other test results, and collaboration with appropriate care team members and the patient's provider was performed as part of comprehensive patient evaluation and provision of chronic care management services.    Goals Addressed            This Visit's Progress   . 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 adherence to ADA/ carb modified diet . exercise 7 days/week . adherence to prescribed  medication regimen  Interventions:  . Provided verbal and EMMI education to patient about basic DM disease process . Reviewed medications with patient and discussed importance of medication adherence . Discussed plans with patient for ongoing care management follow up and provided patient with direct contact information for care management team  Patient Self Care Activities:  . Self administers oral medications as prescribed . Attends all scheduled provider appointments . Adheres to prescribed ADA/carb modified  Initial goal documentation      Ashley Cochran has met his clinical nursing goals. Review of patient status, including review of consultants reports, relevant laboratory and other test results, and collaboration with appropriate care team members and the patient's provider was performed as part of comprehensive patient evaluation and provision of chronic care management services. CCM RN Case Manager is available to Ashley Cochran at any time to assist with care management needs should they arise. Ashley Cochran has been given contact information and instructions to contact RN Case Manager as needed.    [name] E. Rollene Rotunda, RN, BSN Nurse Care Coordinator [facility] / [facility] Care Management  [phone]

## 2019-03-16 NOTE — Progress Notes (Signed)
This encounter was created in error - please disregard.

## 2019-03-16 NOTE — Chronic Care Management (AMB) (Signed)
Care Management   Follow Up Note   03/16/2019 Name: Ashley Cochran MRN: [id number] DOB: 1976/09/10  Referred by: Hubbard Hartshorn, FNP Reason for referral : Chronic Care Management (2 week follow up DM)    Subjective: "I am doing good with following the diet we talked about" "I have all my medicines and taking them the way I should"   Objective:  Lab Results  Component Value Date   HGBA1C 9.5 (H) 12/08/2018     Assessment: Ashley Cochran a 43year old female patientwho seesEmily Uvaldo Rising, FNPfor primary care. Ashley Cochran the CCM team to consult the patient fordiabetes management and medication assistance as patient is uninsurred. Patient has a history of but not limited toHypothyroidism, DM2, and RA. Referral was placed2/20/2020. Patient's last office visit was2/20/2020. CCM RN CM met with Ashley Cochran to establish health goals which includes education related to DM self care on 03/02/2019. Today CCM RN CM followed up with Ashley Cochran to assess progression towards goals.  Ashley Cochran is currently employed full time at [employer] however she missed the insurance enrollment period therefore she will remain uninsured until open enrollment closer to the end of this year. She is obtaining all medications through [facility]. She has been offered referral to [facility] however she wishes to remain a cash paying patient to this practice.  Review of patient status, including review of consultants reports, relevant laboratory and other test results, and collaboration with appropriate care team members and the patient's provider was performed as part of comprehensive patient evaluation and provision of chronic care management services.    Goals Addressed            This Visit's Progress   . COMPLETED: I have had diabetes education but only because I went with my family who had diabetes (pt-stated)       Ashley Cochran is very appreciative of the written  information provided to her during her face to face visit. States she has taken the sample DM menu with her to the grocery to assist her with meal planning. She has taken a few weeks off from work to ensure she will not contract COVID-19 as she lives in the home and cares for her unwell mother. She feels she has all the necessary tools to successfully care for her DM. She continues to receive all her medications from [facility] and taking them as prescribed.   Current Barriers:  Marland Kitchen Knowledge Deficits related to basic Diabetes pathophysiology and self care/management . Film/video editor . Knowledge Deficits related to medications used for management of diabetes  Nurse Case Manager Clinical Goal(s):  Over the next 14 days, patient will demonstrate improved adherence to prescribed treatment plan for diabetes self care/management as evidenced by:  . adherence to ADA/ carb modified diet . exercise 7 days/week . adherence to prescribed medication regimen  Interventions:  . Provided verbal and EMMI education to patient about basic DM disease process . Reviewed medications with patient and discussed importance of medication adherence . Discussed plans with patient for ongoing care management follow up and provided patient with direct contact information for care management team . Provided emotional support and reassurance related to current pandemic of COVID-19 . Discussed current CDC recommendations for social distancing and hand hygiene.    Patient Self Care Activities:  . Self administers oral medications as prescribed . Attends all scheduled provider appointments . Adheres to prescribed ADA/carb modified  . Follow CDC guidelines for COVID-19  Initial  goal documentation     . COMPLETED: I wasn't taking my medications becasue I was forgetting (pt-stated)   On track    Current Barriers:  Marland Kitchen Knowledge Deficits related to medication use  Pharmacist Clinical Goal(s):  Marland Kitchen Over the next 14  days, patient will demonstrate improved understanding of prescribed medications and rationale for usage as evidenced by 100% adherence  . 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   Interventions: . Comprehensive medication review performed. . Advised patient to take all medications as prescribed . Discussed plans with patient for ongoing care management follow up and provided patient with direct contact information for care management team  . Patient educated on purpose, proper use and potential adverse effects of Enbrel, Victoza, Advair, albuterol rescue   Patient Self Care Activities:  . Self administers medications as prescribed  Initial goal documentation         Ashley Cochran has met all care management goals. Review of patient status, including review of consultants reports, relevant laboratory and other test results, and collaboration with appropriate care team members and the patient's provider was performed as part of comprehensive patient evaluation and provision of chronic care management services.  The care management team is available to Ashley Cochran at any time to assist with care management needs should they arise. Ashley Cochran has been given contact information and instructions to contact the care management team with any questions or should new care management needs arise.     Abdullahi Vallone E. Rollene Rotunda, RN, BSN Nurse Care Coordinator [facility] Care Management  [phone]

## 2019-03-16 NOTE — Patient Instructions (Addendum)
Thank you allowing the Chronic Care Management Team to be a part of your care! It was a pleasure speaking with you today!  1. Continue to follow the plan of care for diabetes self care and management. You are doing a great job and should be very proud of yourself. 2. You have met your goals. Congratulation!! 3. Follow the CDC recommendations for COVID-19 infection prevention 4. Please contact the CCM Team if any additional needs or concerns arise.  For information about COVID-19 or "Corona Virus", the following web resources may be helpful:  CDC: BeginnerSteps.be    [place]:  InsuranceIntern.se  Handwashing is one of the best ways to protect yourself and your family from getting sick. Wash Your Hands Often to Stay Healthy  When: You can help yourself and your loved ones stay healthy by washing your hands often, especially during these key times when you are likely to get and spread germs: . Before, during, and after preparing food  . Before eating food  . Before and after caring for someone at home who is sick with vomiting or diarrhea  . Before and after treating a cut or wound  . After using the toilet  . After being in public spaces . After changing diapers or cleaning up a child who has used the toilet  . After blowing your nose, coughing, or sneezing  . After touching an animal, animal feed, or animal waste  . After handling pet food or pet treats  . After touching garbage . After touching public doors, telephones, shopping carts, or other surfaces .  Five Steps to [place] the Right Way 1. Wet your hands with clean, running water (warm or cold), turn off the tap, and apply soap. 2. Lather your hands by rubbing them together with the soap. Lather the backs of your hands, between your fingers, and under your nails. 3. Scrub your hands  for at least 20 seconds. Need a timer? Hum the "Happy Birthday" song from beginning to end twice. 4. Rinse your hands well under clean, running water. 5. Dry your hands using a clean towel or air dry them.  Source: BridgeDigest.is    CCM (Chronic Care Management) Team   Trish Fountain RN, BSN Nurse Care Coordinator  [phone]  Ruben Reason PharmD  Clinical Pharmacist  [phone]   Elliot Gurney, LCSW Clinical Social Worker [phone]  Goals Addressed            This Visit's Progress   . 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 adherence to ADA/ carb modified diet . exercise 7 days/week . adherence to prescribed medication regimen  Interventions:  . Provided verbal and EMMI education to patient about basic DM disease process . Reviewed medications with patient and discussed importance of medication adherence . Discussed plans with patient for ongoing care management follow up and provided patient with direct contact information for care management team . Provided emotional support and reassurance related to current pandemic of COVID-19 . Discussed current CDC recommendations for social distancing and hand  hygiene.    Patient Self Care Activities:  . Self administers oral medications as prescribed . Attends all scheduled provider appointments . Adheres to prescribed ADA/carb modified  . Follow CDC guidelines for COVID-19  Initial goal  documentation     . COMPLETED: I wasn't taking my medications becasue I was forgetting (pt-stated)   On track    Current Barriers:  Marland Kitchen Knowledge Deficits related to medication use  Pharmacist Clinical Goal(s):  Marland Kitchen Over the next 14 days, patient will demonstrate improved understanding of prescribed medications and rationale for usage as evidenced by 100% adherence  . 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   Interventions: . Comprehensive medication review performed. . Advised patient to take all medications as prescribed . Discussed plans with patient for ongoing care management follow up and provided patient with direct contact information for care management team  . Patient educated on purpose, proper use and potential adverse effects of Enbrel, Victoza, Advair, albuterol rescue   Patient Self Care Activities:  . Self administers medications as prescribed  Initial goal documentation        The patient verbalized understanding of instructions provided today and declined a print copy of patient instruction materials.   Next PCP appointment scheduled for: 03/23/2019 No further follow up required: patient has met goals

## 2019-03-16 NOTE — Progress Notes (Signed)
  Medication Management Clinic Visit Note  Patient: Ashley Cochran MRN: [id number] Date of Birth: December 10, 1976 PCP: Doren Custard, FNP   [name] 43 y.o. female presents for a Medication Management visit today.  There were no vitals taken for this visit.  Patient Information   Past Medical History:  Diagnosis Date  . Arthritis    Rheumatoid Arthritis  . Asthma   . Diabetes mellitus without complication (HCC)   . GERD (gastroesophageal reflux disease)   . Kidney stones   . Thyroid disease      No past surgical history on file.   Family History  Problem Relation Age of Onset  . Hypertension Mother   . Diabetes Mother        type 2  . Asthma Mother   . Stroke Mother        Mini strokes  . GER disease Mother   . Hypertension Father   . Diabetes Father        Type 2  . Asthma Father   . GER disease Father   . Asthma Sister   . Allergies Sister   . Asthma Brother   . Allergies Brother     New Diagnoses (since last visit):   Family Support: Good  Lifestyle Diet: States she needs to work on carbohydrate intake Drinks:water, milk, Pepsi            Social History   Substance and Sexual Activity  Alcohol Use No      Social History   Tobacco Use  Smoking Status Former Smoker  . Last attempt to quit: 09/20/2004  . Years since quitting: 14.4  Smokeless Tobacco Never Used      Health Maintenance  Topic Date Due  . OPHTHALMOLOGY EXAM  12/07/1984  . HIV Screening  12/07/1989  . PAP SMEAR-Modifier  12/08/1995  . INFLUENZA VACCINE  03/21/2019 (Originally 07/21/2018)  . TETANUS/TDAP  02/10/2020 (Originally 12/07/1993)  . HEMOGLOBIN A1C  06/09/2019  . FOOT EXAM  01/27/2020  . URINE MICROALBUMIN  01/27/2020  . PNEUMOCOCCAL POLYSACCHARIDE VACCINE AGE 90-64 HIGH RISK  Completed     Assessment and Plan: 1. Diabetes (empagliflozin, liraglutide): hasn't checked in a while, but last checking weeks ago, BG was 200-250. However, has recently started on  Jardiance; counseled patient on drinking plenty of water with to reduce likelihood of genitourinary infections.  Last A1c in 11/2018 was 9.5. ADA recommendations aee < 7%. Discussed limiting Pepsi as well as reducing carbohydrate intake and eating more protein. Needs refill on lancets.   2. Asthma (albuterol, Singulair): is not using her inhaler more than 1x/week. Has recently been taken off of Advair.  3. Hypothyroidism (Levothyroxine): takes at night, a few hours after eating last meal. Last TSH in 08/2018 was normal at 1.26.  4. Rheumatoid Arthritis (Enbrel, Baclofen, Flexeril): has recently been getting Enbrel from Dr. Feliz Beam office rather than from here.  Patient states compliance, has only missed Victoza x 1 in last week. 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 Encounter was limited by telephone call.  Mauri Reading, PharmD Pharmacy Resident  03/16/2019 9:22 AM

## 2019-03-17 ENCOUNTER — Telehealth: Payer: Self-pay | Admitting: Pharmacist

## 2019-03-17 ENCOUNTER — Telehealth: Payer: Self-pay

## 2019-03-17 NOTE — Telephone Encounter (Signed)
03/17/2019 11:02:24 AM - Victoza & tips refill  03/17/2019 Faxed [employer] refill request for Victoza Inject 1.8mg  daily #4, & Novofine 32G tips.[name]

## 2019-03-23 ENCOUNTER — Encounter: Payer: Self-pay | Admitting: Family Medicine

## 2019-06-14 ENCOUNTER — Telehealth: Admit: 2019-06-14 | Discharge: 2019-06-15

## 2019-06-14 DIAGNOSIS — M059 Rheumatoid arthritis with rheumatoid factor, unspecified: Principal | ICD-10-CM

## 2019-06-14 MED ORDER — PREDNISONE 10 MG TABLET
ORAL_TABLET | ORAL | 0 refills | 0 days | Status: CP
Start: 2019-06-14 — End: 2019-07-12

## 2019-06-20 ENCOUNTER — Encounter: Payer: Self-pay | Admitting: Nurse Practitioner

## 2019-06-20 ENCOUNTER — Ambulatory Visit: Payer: Self-pay | Admitting: Nurse Practitioner

## 2019-06-20 ENCOUNTER — Other Ambulatory Visit: Payer: Self-pay

## 2019-06-20 VITALS — BP 128/70 | HR 94 | Temp 97.0°F | Resp 14 | Ht 63.0 in | Wt 257.0 lb

## 2019-06-20 DIAGNOSIS — M069 Rheumatoid arthritis, unspecified: Secondary | ICD-10-CM

## 2019-06-20 DIAGNOSIS — E1165 Type 2 diabetes mellitus with hyperglycemia: Secondary | ICD-10-CM

## 2019-06-20 DIAGNOSIS — R319 Hematuria, unspecified: Secondary | ICD-10-CM

## 2019-06-20 DIAGNOSIS — Z6841 Body Mass Index (BMI) 40.0 and over, adult: Secondary | ICD-10-CM

## 2019-06-20 DIAGNOSIS — M545 Low back pain, unspecified: Secondary | ICD-10-CM

## 2019-06-20 LAB — POCT URINALYSIS DIPSTICK
Bilirubin, UA: NEGATIVE
Blood, UA: POSITIVE
Glucose, UA: POSITIVE — AB
Ketones, UA: NEGATIVE
Leukocytes, UA: NEGATIVE
Nitrite, UA: NEGATIVE
Odor: NORMAL
Protein, UA: NEGATIVE
Spec Grav, UA: 1.025 (ref 1.010–1.025)
Urobilinogen, UA: 0.2 E.U./dL
pH, UA: 6.5 (ref 5.0–8.0)

## 2019-06-20 MED ORDER — TAMSULOSIN HCL 0.4 MG PO CAPS: 0.4000 mg | ORAL_CAPSULE | Freq: Every day | ORAL | 0 refills | Status: DC

## 2019-06-20 MED ORDER — TIZANIDINE HCL 4 MG PO TABS: 4.0000 mg | ORAL_TABLET | Freq: Three times a day (TID) | ORAL | 0 refills | Status: DC | PRN

## 2019-06-20 NOTE — Patient Instructions (Addendum)
If you have not heard anything from my staff in a week about any labs from today, please contact us here to follow-up [phone].   -Drink at least 70 to 80 ounces of water a day to flush your kidneys.  Avoid all caffeine for at least 2 weeks.  Take muscle relaxer as prescribed for muscle spasms and lower back pain.  Be aware muscle relaxer can make you drowsy do not operate heavy machinery after taking this medication or take it with alcohol.  Can additionally take acetaminophen 500 to 1000 mg every 8 hours.  Do not exceed more than 3000 mg of acetaminophen in 24 hours.  Can take Naprosyn as directed on bottle additionally with acetaminophen if needed for additional pain relief.  Your urinalysis did show some blood in your urine which is abnormal due to your pain located at your kidney region we are also sending you tamsulosin to take to open up your urethra to allow a kidney stone to pass.  If you develop worsening pain, fevers and chills, nausea and vomiting, frank blood in your urine please go to ER for further evaluation.  You can look for coupons for medications [email or web address] [email or web address] or use the good Rx application on the smart phone

## 2019-06-20 NOTE — Progress Notes (Signed)
Name: Ashley Cochran   MRN: [id number]    DOB: [birth year]   Date:06/20/2019       Progress Note  Subjective  Chief Complaint  Chief Complaint  Patient presents with  . Urinary Tract Infection    onset 1 week rt flank pain    HPI  Patient endorses lower back pain that started 1 week ago. Worse on right flank pain that has progressively worsened. Pain has become pretty constant. She has tried baclofen ant OTC tylenol with mild to moderate relief. LMP- 05/28/2019. Aggrevated by sitting for long time. Denies sciatic pain or radiation of pain.  Patient drinks plenty of water.  Denies vaginal discharge, dysuria, nausea, vomiting, diarrhea, constipation. States had kidney stones in the past but doesn't feel same.    Patient is taking prednisone taper rx by rheumatologist last week due to worsening joint pain, she is additionally taking weekly ethanercept.  She has been working on weight loss with almost 10 pounds weight loss since last visit.  Prednisone is increased sugars and appetite-she has been taking her Victoza and Jardiance as prescribed with no missed doses.  States fasting sugars have been previously well controlled so she had stopped checking them.  However since starting prednisone her blood sugars have been between 120 and 250.  Wt Readings from Last 3 Encounters:  06/20/19 257 lb (116.6 kg)  02/09/19 266 lb 1.6 oz (120.7 kg)  01/26/19 266 lb 6.4 oz (120.8 kg)    PHQ2/9: Depression screen [facility] 2/9 06/20/2019 02/09/2019 01/26/2019  Decreased Interest 0 0 0  Down, Depressed, Hopeless 0 0 0  PHQ - 2 Score 0 0 0  Altered sleeping 0 0 -  Tired, decreased energy 0 0 -  Change in appetite 0 0 -  Feeling bad or failure about yourself  0 0 -  Trouble concentrating 0 0 -  Moving slowly or fidgety/restless 0 0 -  Suicidal thoughts 0 0 -  PHQ-9 Score 0 0 -  Difficult doing work/chores Not difficult at all Not difficult at all -     PHQ reviewed. Negative  Patient Active Problem List    Diagnosis Date Noted  . Class 3 severe obesity due to excess calories with serious comorbidity and body mass index (BMI) of 40.0 to 44.9 in adult (HCC) 01/26/2019  . Type 2 diabetes mellitus with hyperglycemia, without long-term current use of insulin (HCC) 01/26/2019  . Dyslipidemia associated with type 2 diabetes mellitus (HCC) 01/26/2019  . Hypothyroidism 04/09/2015  . Rheumatoid arthritis (HCC) 04/09/2015    Past Medical History:  Diagnosis Date  . Arthritis    Rheumatoid Arthritis  . Asthma   . Diabetes mellitus without complication (HCC)   . GERD (gastroesophageal reflux disease)   . Kidney stones   . Nephrolithiasis 08/19/2016  . Thyroid disease     History reviewed. No pertinent surgical history.  Social History   Tobacco Use  . Smoking status: Former Smoker    Quit date: 09/20/2004    Years since quitting: 14.7  . Smokeless tobacco: Never Used  Substance Use Topics  . Alcohol use: No     Current Outpatient Medications:  .  albuterol (PROVENTIL HFA;VENTOLIN HFA) 108 (90 Base) MCG/ACT inhaler, Inhale 2 puffs into the lungs every 6 (six) hours as needed for wheezing or shortness of breath., Disp: 3 Inhaler, Rfl: 4 .  aspirin-acetaminophen-caffeine (EXCEDRIN MIGRAINE) 250-250-65 MG tablet, Take by mouth., Disp: , Rfl:  .  baclofen (LIORESAL) 20 MG tablet, Take 20 mg  by mouth., Disp: , Rfl:  .  cyclobenzaprine (FLEXERIL) 10 MG tablet, Take 10 mg by mouth as needed for muscle spasms (every 8 hours as needed for spasms.)., Disp: , Rfl:  .  empagliflozin (JARDIANCE) 10 MG TABS tablet, Take 10 mg by mouth daily., Disp: 90 tablet, Rfl: 1 .  Etanercept 50 MG/ML SOCT, Inject 1 Dose into the skin once a week., Disp: , Rfl:  .  levothyroxine (SYNTHROID, LEVOTHROID) 175 MCG tablet, Take daily, Disp: 30 tablet, Rfl: 4 .  liraglutide (VICTOZA) 18 MG/3ML SOPN, Inject 0.3 mLs (1.8 mg total) into the skin daily., Disp: 36 mL, Rfl: 3 .  meclizine (ANTIVERT) 25 MG tablet, Take 1 tablet  (25 mg total) by mouth 3 (three) times daily as needed for dizziness., Disp: 30 tablet, Rfl: 0 .  montelukast (SINGULAIR) 10 MG tablet, Take 1 tablet (10 mg total) by mouth daily., Disp: 90 tablet, Rfl: 1 .  naproxen (NAPROSYN) 500 MG tablet, Take 0.5 tablets (250 mg total) by mouth 3 (three) times daily as needed., Disp: 200 tablet, Rfl: 4 .  omeprazole (PRILOSEC) 20 MG capsule, Take 1 capsule (20 mg total) by mouth daily., Disp: 30 capsule, Rfl: 3 .  ondansetron (ZOFRAN) 4 MG tablet, Take 1 tablet (4 mg total) by mouth 2 (two) times daily., Disp: 20 tablet, Rfl: 1 .  OVER THE COUNTER MEDICATION, Apply 1 application topically daily. Triderma MD, Disp: , Rfl:  .  predniSONE (DELTASONE) 10 MG tablet, Take by mouth., Disp: , Rfl:  .  traZODone (DESYREL) 50 MG tablet, Take 1 tablet (50 mg total) by mouth at bedtime., Disp: 90 tablet, Rfl: 4 .  triamcinolone (NASACORT) 55 MCG/ACT AERO nasal inhaler, Place 2 sprays into the nose daily., Disp: 3 Inhaler, Rfl: 4 .  tamsulosin (FLOMAX) 0.4 MG CAPS capsule, Take 1 capsule (0.4 mg total) by mouth daily., Disp: 7 capsule, Rfl: 0 .  tiZANidine (ZANAFLEX) 4 MG tablet, Take 1 tablet (4 mg total) by mouth every 8 (eight) hours as needed for muscle spasms., Disp: 30 tablet, Rfl: 0  Allergies  Allergen Reactions  . Nitrofuran Derivatives Rash    jittery  . Latex   . Strawberry Flavor   . Tape Rash    ROS    No other specific complaints in a complete review of systems (except as listed in HPI above).  Objective  Vitals:   06/20/19 1551  BP: 128/70  Pulse: 94  Resp: 14  Temp: (!) 97 F (36.1 C)  TempSrc: Temporal  SpO2: 98%  Weight: 257 lb (116.6 kg)  Height: 5\' 3"  (1.6 m)     Body mass index is 45.53 kg/m.  Nursing Note and Vital Signs reviewed.  Physical Exam Vitals signs reviewed.  Constitutional:      Appearance: She is well-developed.  HENT:     Head: Normocephalic and atraumatic.  Neck:     Musculoskeletal: Normal range of  motion and neck supple.     Vascular: No carotid bruit.  Cardiovascular:     Pulses: Normal pulses.     Heart sounds: Normal heart sounds.  Pulmonary:     Effort: Pulmonary effort is normal.     Breath sounds: Normal breath sounds.  Abdominal:     Tenderness: There is no abdominal tenderness. There is right CVA tenderness and left CVA tenderness.  Musculoskeletal:     Lumbar back: She exhibits decreased range of motion, tenderness, pain and spasm. She exhibits no bony tenderness, no swelling, no edema  and no deformity.  Skin:    General: Skin is warm and dry.     Capillary Refill: Capillary refill takes less than 2 seconds.  Neurological:     Mental Status: She is alert and oriented to person, place, and time.     GCS: GCS eye subscore is 4. GCS verbal subscore is 5. GCS motor subscore is 6.     Sensory: No sensory deficit.  Psychiatric:        Speech: Speech normal.        Behavior: Behavior normal.        Thought Content: Thought content normal.        Judgment: Judgment normal.      Results for orders placed or performed in visit on 06/20/19 (from the past 48 hour(s))  POCT urinalysis dipstick     Status: Abnormal   Collection Time: 06/20/19  4:02 PM  Result Value Ref Range   Color, UA yellow    Clarity, UA clear    Glucose, UA Positive (A) Negative   Bilirubin, UA neg    Ketones, UA neg    Spec Grav, UA 1.025 1.010 - 1.025   Blood, UA positive    pH, UA 6.5 5.0 - 8.0   Protein, UA Negative Negative   Urobilinogen, UA 0.2 0.2 or 1.0 E.U./dL   Nitrite, UA neg    Leukocytes, UA Negative Negative   Appearance clear    Odor normal     Assessment & Plan  1. Midline low back pain without sciatica, unspecified chronicity Patient dorsals lower back pain and has bilateral CVA tenderness worse on the right with hematuria consider kidney stone however patient is uninsured and CT renal study is too expensive.  Discussed ER precautions will treat with increasing water  hydration, cutting out caffeine take tamsulosin to treat as kidney stone.  Additionally utilize Zanaflex as she is having muscle spasms in pain was improved with baclofen.  Discussed OTC management with acetaminophen and Naprosyn. - POCT urinalysis dipstick - COMPLETE METABOLIC PANEL WITH GFR - tiZANidine (ZANAFLEX) 4 MG tablet; Take 1 tablet (4 mg total) by mouth every 8 (eight) hours as needed for muscle spasms.  Dispense: 30 tablet; Refill: 0 - tamsulosin (FLOMAX) 0.4 MG CAPS capsule; Take 1 capsule (0.4 mg total) by mouth daily.  Dispense: 7 capsule; Refill: 0  2. Hematuria, unspecified type Follow-up urine at physical. - Urinalysis, Routine w reflex microscopic - COMPLETE METABOLIC PANEL WITH GFR - CBC with Differential - tamsulosin (FLOMAX) 0.4 MG CAPS capsule; Take 1 capsule (0.4 mg total) by mouth daily.  Dispense: 7 capsule; Refill: 0  3. Rheumatoid arthritis, involving unspecified site, unspecified rheumatoid factor presence ([place]) Some improvement with prednisone but overall states still painful  4. Type 2 diabetes mellitus with hyperglycemia, without long-term current use of insulin ([place]) Plan to increase Jardiance if necessary - HgB A1c  5. Class 3 severe obesity due to excess calories with serious comorbidity and body mass index (BMI) of 40.0 to 44.9 in adult [facility]) Work on diet and portion control now and exercise when pain is improved

## 2019-06-21 LAB — COMPLETE METABOLIC PANEL WITHOUT GFR
AG Ratio: 1 (calc) (ref 1.0–2.5)
ALT: 96 U/L — ABNORMAL HIGH (ref 6–29)
AST: 111 U/L — ABNORMAL HIGH (ref 10–30)
Albumin: 3.9 g/dL (ref 3.6–5.1)
Alkaline phosphatase (APISO): 115 U/L (ref 31–125)
BUN: 14 mg/dL (ref 7–25)
CO2: 21 mmol/L (ref 20–32)
Calcium: 9.5 mg/dL (ref 8.6–10.2)
Chloride: 101 mmol/L (ref 98–110)
Creat: 0.75 mg/dL (ref 0.50–1.10)
GFR, Est African American: 112 mL/min/{1.73_m2}
GFR, Est Non African American: 97 mL/min/{1.73_m2}
Globulin: 3.9 g/dL — ABNORMAL HIGH (ref 1.9–3.7)
Glucose, Bld: 330 mg/dL — ABNORMAL HIGH (ref 65–99)
Potassium: 4.6 mmol/L (ref 3.5–5.3)
Sodium: 134 mmol/L — ABNORMAL LOW (ref 135–146)
Total Bilirubin: 0.4 mg/dL (ref 0.2–1.2)
Total Protein: 7.8 g/dL (ref 6.1–8.1)

## 2019-06-21 LAB — URINALYSIS, ROUTINE W REFLEX MICROSCOPIC
Bilirubin Urine: NEGATIVE
Hgb urine dipstick: NEGATIVE
Ketones, ur: NEGATIVE
Leukocytes,Ua: NEGATIVE
Nitrite: NEGATIVE
Protein, ur: NEGATIVE
Specific Gravity, Urine: 1.032 (ref 1.001–1.03)
pH: <=5 (ref 5.0–8.0)

## 2019-06-21 LAB — HEMOGLOBIN A1C
Hgb A1c MFr Bld: 10.1 % of total Hgb — ABNORMAL HIGH (ref <5.7)
Mean Plasma Glucose: 243 (calc)
eAG (mmol/L): 13.5 (calc)

## 2019-06-21 LAB — CBC WITH DIFFERENTIAL/PLATELET
Absolute Monocytes: 333 cells/uL (ref 200–950)
Basophils Absolute: 36 cells/uL (ref 0–200)
Basophils Relative: 0.3 %
Eosinophils Absolute: 12 cells/uL — ABNORMAL LOW (ref 15–500)
Eosinophils Relative: 0.1 %
HCT: 39.9 % (ref 35.0–45.0)
Hemoglobin: 13.2 g/dL (ref 11.7–15.5)
Lymphs Abs: 1690 cells/uL (ref 850–3900)
MCH: 27.4 pg (ref 27.0–33.0)
MCHC: 33.1 g/dL (ref 32.0–36.0)
MCV: 83 fL (ref 80.0–100.0)
MPV: 10.4 fL (ref 7.5–12.5)
Monocytes Relative: 2.8 %
Neutro Abs: 9829 cells/uL — ABNORMAL HIGH (ref 1500–7800)
Neutrophils Relative %: 82.6 %
Platelets: 320 10*3/uL (ref 140–400)
RBC: 4.81 10*6/uL (ref 3.80–5.10)
RDW: 12.8 % (ref 11.0–15.0)
Total Lymphocyte: 14.2 %
WBC: 11.9 10*3/uL — ABNORMAL HIGH (ref 3.8–10.8)

## 2019-06-22 ENCOUNTER — Other Ambulatory Visit: Payer: Self-pay | Admitting: Nurse Practitioner

## 2019-06-22 DIAGNOSIS — D72825 Bandemia: Secondary | ICD-10-CM

## 2019-06-22 DIAGNOSIS — E1165 Type 2 diabetes mellitus with hyperglycemia: Secondary | ICD-10-CM

## 2019-06-22 MED ORDER — JARDIANCE 25 MG PO TABS: 25.0000 mg | ORAL_TABLET | Freq: Every day | ORAL | 1 refills | Status: DC

## 2019-06-22 MED ORDER — TRESIBA 100 UNIT/ML ~~LOC~~ SOLN: 10.0000 [IU] | Freq: Every day | SUBCUTANEOUS | 3 refills | Status: DC

## 2019-06-26 ENCOUNTER — Telehealth: Payer: Self-pay | Admitting: Pharmacist

## 2019-06-26 NOTE — Telephone Encounter (Signed)
06/26/2019 10:30:23 AM - Tyler Aas Flextouch & Jardiance increase  06/26/2019 Received a pharmacy printout for New Med-Tresiba U-100 Flextouch Inject 10 units into the skin daily=1 box, also increased Jardiance dose from 10mg  to 25mg  Take 1 tablet daily. I have printed applications, and reprinted patient portion of Ventolin application-Putting in bag with meds-THIS IS FINAL REQUEST FROM PATIENT'S INCOME FOR 2020 & 2019 TAXES. WE HAVE REQUESTED THIS INFORMATION 02/09/2019 & 04/07/2019-PATIENT HAS SIGNED ALL FORMS MAILED TO HER BUT NOT RETURNED ANY INCOME.[name]

## 2019-07-19 ENCOUNTER — Telehealth: Payer: Self-pay | Admitting: Pharmacy Technician

## 2019-07-19 NOTE — Telephone Encounter (Signed)
Received 2020 proof of income.  Patient eligible to receive medication assistance at [facility] as long as eligibility requirements continue to be met.  [facility]

## 2019-08-14 ENCOUNTER — Other Ambulatory Visit: Payer: Self-pay | Admitting: Adult Health Nurse Practitioner

## 2019-09-06 DIAGNOSIS — M059 Rheumatoid arthritis with rheumatoid factor, unspecified: Secondary | ICD-10-CM

## 2019-09-06 MED ORDER — NAPROXEN 500 MG TABLET
ORAL_TABLET | Freq: Two times a day (BID) | ORAL | 1 refills | 15.00000 days | Status: CP | PRN
Start: 2019-09-06 — End: 2019-09-14

## 2019-09-07 ENCOUNTER — Telehealth: Payer: Self-pay | Admitting: Family Medicine

## 2019-09-07 NOTE — Telephone Encounter (Signed)
omeprazole (PRILOSEC) 20 MG capsule  levothyroxine (SYNTHROID, LEVOTHROID) 175 MCG tablet  montelukast (SINGULAIR) 10 MG tablet   [facility] [place], [place] [phone] (Phone) [phone] (Fax)   Pt last seen in June 2020 Pls refill

## 2019-09-08 ENCOUNTER — Other Ambulatory Visit: Payer: Self-pay | Admitting: Emergency Medicine

## 2019-09-08 DIAGNOSIS — E039 Hypothyroidism, unspecified: Secondary | ICD-10-CM

## 2019-09-08 DIAGNOSIS — Z8719 Personal history of other diseases of the digestive system: Secondary | ICD-10-CM

## 2019-09-08 NOTE — Telephone Encounter (Signed)
Orders put in

## 2019-09-09 MED ORDER — LEVOTHYROXINE SODIUM 175 MCG PO TABS: ORAL_TABLET | ORAL | 0 refills | Status: DC

## 2019-09-09 MED ORDER — OMEPRAZOLE 20 MG PO CPDR: 20.0000 mg | DELAYED_RELEASE_CAPSULE | Freq: Every day | ORAL | 0 refills | Status: DC

## 2019-09-09 MED ORDER — MONTELUKAST SODIUM 10 MG PO TABS: 10.0000 mg | ORAL_TABLET | Freq: Every day | ORAL | 0 refills | Status: DC

## 2019-09-09 NOTE — Telephone Encounter (Signed)
Please schedule patient for follow up in the next 30 days w/ labs

## 2019-09-11 NOTE — Telephone Encounter (Signed)
Pt already has an appt for 10.8.2020

## 2019-09-14 MED ORDER — NAPROXEN 500 MG TABLET
ORAL_TABLET | Freq: Two times a day (BID) | ORAL | 5 refills | 30.00000 days | Status: CP | PRN
Start: 2019-09-14 — End: ?

## 2019-09-18 ENCOUNTER — Other Ambulatory Visit: Payer: Self-pay

## 2019-09-18 ENCOUNTER — Ambulatory Visit: Payer: Self-pay | Admitting: Pharmacist

## 2019-09-18 ENCOUNTER — Encounter: Payer: Self-pay | Admitting: Pharmacist

## 2019-09-18 DIAGNOSIS — Z79899 Other long term (current) drug therapy: Secondary | ICD-10-CM

## 2019-09-18 NOTE — Progress Notes (Signed)
  [facility] Visit Note  Patient: Ashley Cochran MRN: [id number] Date of Birth: 09-May-1976 PCP: Hubbard Hartshorn, FNP   Ashley Cochran 43 y.o. female presents for a medication therapy management visit today.  There were no vitals taken for this visit.  Patient Information   Past Medical History:  Diagnosis Date  . Arthritis    Rheumatoid Arthritis  . Asthma   . Diabetes mellitus without complication ([place])   . GERD (gastroesophageal reflux disease)   . Kidney stones   . Nephrolithiasis 08/19/2016  . Thyroid disease      History reviewed. No pertinent surgical history.   Family History  Problem Relation Age of Onset  . Hypertension Mother   . Diabetes Mother        type 2  . Asthma Mother   . Stroke Mother        Mini strokes  . GER disease Mother   . Hypertension Father   . Diabetes Father        Type 2  . Asthma Father   . GER disease Father   . Asthma Sister   . Allergies Sister   . Asthma Brother   . Allergies Brother          Social History   Substance and Sexual Activity  Alcohol Use No      Social History   Tobacco Use  Smoking Status Former Smoker  . Quit date: 09/20/2004  . Years since quitting: 15.0  Smokeless Tobacco Never Used      Health Maintenance  Topic Date Due  . OPHTHALMOLOGY EXAM  12/07/1984  . HIV Screening  12/07/1989  . PAP SMEAR-Modifier  12/08/1995  . INFLUENZA VACCINE  07/22/2019  . TETANUS/TDAP  02/10/2020 (Originally 12/07/1993)  . HEMOGLOBIN A1C  12/20/2019  . FOOT EXAM  01/27/2020  . URINE MICROALBUMIN  01/27/2020  . PNEUMOCOCCAL POLYSACCHARIDE VACCINE AGE 12-64 HIGH RISK  Completed     Assessment and Plan:  Diabetes Mellitus:  Patient currently taking Jardiance, Tresiba, and Victoza.  A1c 10.1 in June 2020.  Patient was recently started on jardiance and tresiba for A1c >10.  Recommended she return for repeat A1c in October  Asthma:  Patient currently taking ventolin.  Advair discontinued by  doctor.  Patient takes ventolin PRN 2-3 times weekly.  Hypothyroidism:  Levothyroxine

## 2019-09-19 ENCOUNTER — Other Ambulatory Visit: Payer: Self-pay

## 2019-09-19 ENCOUNTER — Encounter (INDEPENDENT_AMBULATORY_CARE_PROVIDER_SITE_OTHER): Payer: Self-pay

## 2019-09-26 ENCOUNTER — Telehealth: Payer: Self-pay | Admitting: Pharmacist

## 2019-09-26 NOTE — Telephone Encounter (Signed)
09/26/2019 8:29:14 AM - Ashley Cochran refill called in to [facility]  09/26/2019 Called [facility] on 09/25/2019 for refill on Jardiance, (rx# [id number]) this will be shipped to [place], allow 7-10 business days to receive.[name]

## 2019-09-28 ENCOUNTER — Encounter: Payer: Self-pay | Admitting: Family Medicine

## 2019-10-11 ENCOUNTER — Telehealth: Payer: Self-pay | Admitting: Pharmacist

## 2019-10-11 NOTE — Telephone Encounter (Signed)
10/11/2019 10:16:04 AM - Tyler Aas U100 Flextouch & tips refill  10/11/2019 Mailing provider Suezanne Cheshire, NP @ [facility] a [employer] refill request to sign & return for Tresiba U100 Flextouch Inject 10 units once daily #1 box & Novofine 32G tips Use daily with flextouch #2 boxes.[name]

## 2019-10-16 ENCOUNTER — Telehealth: Payer: Self-pay | Admitting: Pharmacist

## 2019-10-16 ENCOUNTER — Other Ambulatory Visit: Payer: Self-pay

## 2019-10-16 ENCOUNTER — Ambulatory Visit (INDEPENDENT_AMBULATORY_CARE_PROVIDER_SITE_OTHER): Payer: Self-pay

## 2019-10-16 DIAGNOSIS — Z23 Encounter for immunization: Secondary | ICD-10-CM

## 2019-10-16 LAB — CBC WITH DIFFERENTIAL/PLATELET
Absolute Monocytes: 416 cells/uL (ref 200–950)
Basophils Absolute: 51 cells/uL (ref 0–200)
Basophils Relative: 0.7 %
Eosinophils Absolute: 131 cells/uL (ref 15–500)
Eosinophils Relative: 1.8 %
HCT: 45.3 % — ABNORMAL HIGH (ref 35.0–45.0)
Hemoglobin: 14.6 g/dL (ref 11.7–15.5)
Lymphs Abs: 2781 cells/uL (ref 850–3900)
MCH: 26.2 pg — ABNORMAL LOW (ref 27.0–33.0)
MCHC: 32.2 g/dL (ref 32.0–36.0)
MCV: 81.2 fL (ref 80.0–100.0)
MPV: 10.1 fL (ref 7.5–12.5)
Monocytes Relative: 5.7 %
Neutro Abs: 3920 cells/uL (ref 1500–7800)
Neutrophils Relative %: 53.7 %
Platelets: 276 10*3/uL (ref 140–400)
RBC: 5.58 10*6/uL — ABNORMAL HIGH (ref 3.80–5.10)
RDW: 13.3 % (ref 11.0–15.0)
Total Lymphocyte: 38.1 %
WBC: 7.3 10*3/uL (ref 3.8–10.8)

## 2019-10-16 NOTE — Telephone Encounter (Signed)
10/16/2019 3:06:55 PM - Ashley Cochran U100 Flextouch & tips refill  10/16/2019 I have faxed [facility] refill request for Tresiba U-100 Flextouch & tips to be processed--with provider Leisa R. Lucio Edward, PA-C @ [place]

## 2019-10-23 ENCOUNTER — Other Ambulatory Visit: Payer: Self-pay

## 2019-10-23 ENCOUNTER — Ambulatory Visit (INDEPENDENT_AMBULATORY_CARE_PROVIDER_SITE_OTHER): Payer: Self-pay | Admitting: Family Medicine

## 2019-10-23 ENCOUNTER — Encounter: Payer: Self-pay | Admitting: Family Medicine

## 2019-10-23 DIAGNOSIS — Z8669 Personal history of other diseases of the nervous system and sense organs: Secondary | ICD-10-CM

## 2019-10-23 DIAGNOSIS — D84821 Immunodeficiency due to drugs: Secondary | ICD-10-CM

## 2019-10-23 DIAGNOSIS — E1165 Type 2 diabetes mellitus with hyperglycemia: Secondary | ICD-10-CM

## 2019-10-23 DIAGNOSIS — M069 Rheumatoid arthritis, unspecified: Secondary | ICD-10-CM

## 2019-10-23 DIAGNOSIS — Z20822 Contact with and (suspected) exposure to covid-19: Secondary | ICD-10-CM

## 2019-10-23 DIAGNOSIS — Z20828 Contact with and (suspected) exposure to other viral communicable diseases: Secondary | ICD-10-CM

## 2019-10-23 DIAGNOSIS — Z79899 Other long term (current) drug therapy: Secondary | ICD-10-CM

## 2019-10-23 MED ORDER — GUAIFENESIN ER 600 MG PO TB12: 600.0000 mg | ORAL_TABLET | Freq: Two times a day (BID) | ORAL | 0 refills | Status: DC

## 2019-10-23 MED ORDER — ALBUTEROL SULFATE (2.5 MG/3ML) 0.083% IN NEBU: 2.5000 mg | INHALATION_SOLUTION | Freq: Four times a day (QID) | RESPIRATORY_TRACT | 1 refills | Status: DC | PRN

## 2019-10-23 MED ORDER — FLUTICASONE PROPIONATE 50 MCG/ACT NA SUSP: 2.0000 | Freq: Every day | NASAL | 6 refills | Status: DC

## 2019-10-23 MED ORDER — BUDESONIDE-FORMOTEROL FUMARATE 80-4.5 MCG/ACT IN AERO: 2.0000 | INHALATION_SPRAY | Freq: Two times a day (BID) | RESPIRATORY_TRACT | 3 refills | Status: DC

## 2019-10-23 MED ORDER — PROMETHAZINE-DM 6.25-15 MG/5ML PO SYRP: 5.0000 mL | ORAL_SOLUTION | Freq: Four times a day (QID) | ORAL | 0 refills | Status: DC | PRN

## 2019-10-23 NOTE — Progress Notes (Signed)
Name: Ashley Cochran   MRN: [id number]    DOB: 06-Jul-1976   Date:10/23/2019       Progress Note  Subjective  Chief Complaint  Chief Complaint  Patient presents with   URI    cough, congested, fever,     I connected with  Ashley Cochran on 10/23/19 at  1:40 PM EST by telephone and verified that I am speaking with the correct person using two identifiers.  I discussed the limitations, risks, security and privacy concerns of performing an evaluation and management service by telephone and the availability of in person appointments. Staff also discussed with the patient that there may be a patient responsible charge related to this service. Patient Location: Home Provider Location: Office Additional Individuals present: None  HPI  Pt presents with concern for COVID exposure with symptoms that started 2 weeks ago.  She has been testing this morning, but wanted to discuss her symptoms and treatment.  Her aunt and mother both tested positive over the weekend.  She is struggling with congestion (nasal > chest), some wheezing, migraine headache (has history), cough, fatigue, subjective fevers and chills, diarrhea, some shortness of breath with exertion.  Cough is not improving. She has been taking dayquil and nyquil with only some relief. - RA - skipped Enbrel last week because she ran out. She will remain off for 1 more week in case antibiotics are needed. - DM - has not been checking her BG's, no shakiness, no sweating, polyuria, polydipsia, or polyuria.  Discussed sick day care in detail.  Patient Active Problem List   Diagnosis Date Noted   Class 3 severe obesity due to excess calories with serious comorbidity and body mass index (BMI) of 40.0 to 44.9 in adult [facility](HCC) 01/26/2019   Type 2 diabetes mellitus with hyperglycemia, without long-term current use of insulin (HCC) 01/26/2019   Dyslipidemia associated with type 2 diabetes mellitus (HCC) 01/26/2019   Hypothyroidism 04/09/2015    Rheumatoid arthritis (HCC) 04/09/2015    No past surgical history on file.  Family History  Problem Relation Age of Onset   Hypertension Mother    Diabetes Mother        type 2   Asthma Mother    Stroke Mother        Mini strokes   GER disease Mother    Hypertension Father    Diabetes Father        Type 2   Asthma Father    GER disease Father    Asthma Sister    Allergies Sister    Asthma Brother    Allergies Brother     Social History   Socioeconomic History   Marital status: Legally Separated    Spouse name: Not on file   Number of children: Not on file   Years of education: Not on file   Highest education level: Not on file  Occupational History   Not on file  Social Needs   Financial resource strain: Somewhat hard   Food insecurity    Worry: Never true    Inability: Never true   Transportation needs    Medical: No    Non-medical: No  Tobacco Use   Smoking status: Former Smoker    Quit date: 09/20/2004    Years since quitting: 15.0   Smokeless tobacco: Never Used  Substance and Sexual Activity   Alcohol use: No   Drug use: No   Sexual activity: Not on file  Lifestyle   Physical activity  Days per week: 0 days    Minutes per session: 0 min   Stress: To some extent  Relationships   Social connections    Talks on phone: More than three times a week    Gets together: Once a week    Attends religious service: Never    Active member of club or organization: No    Attends meetings of clubs or organizations: Never    Relationship status: Separated   Intimate partner violence    Fear of current or ex partner: No    Emotionally abused: No    Physically abused: No    Forced sexual activity: No  Other Topics Concern   Not on file  Social History Narrative   Not on file     Current Outpatient Medications:    albuterol (PROVENTIL HFA;VENTOLIN HFA) 108 (90 Base) MCG/ACT inhaler, Inhale 2 puffs into the lungs every 6  (six) hours as needed for wheezing or shortness of breath., Disp: 3 Inhaler, Rfl: 4   aspirin-acetaminophen-caffeine (EXCEDRIN MIGRAINE) 250-250-65 MG tablet, Take by mouth., Disp: , Rfl:    baclofen (LIORESAL) 20 MG tablet, Take 20 mg by mouth., Disp: , Rfl:    cyclobenzaprine (FLEXERIL) 10 MG tablet, Take 10 mg by mouth as needed for muscle spasms (every 8 hours as needed for spasms.)., Disp: , Rfl:    empagliflozin (JARDIANCE) 25 MG TABS tablet, Take 25 mg by mouth daily., Disp: 90 tablet, Rfl: 1   Etanercept 50 MG/ML SOCT, Inject 1 Dose into the skin once a week., Disp: , Rfl:    Insulin Degludec (TRESIBA) 100 UNIT/ML SOLN, Inject 10 Units into the skin daily., Disp: 10 mL, Rfl: 3   levothyroxine (SYNTHROID) 175 MCG tablet, Take daily, Disp: 30 tablet, Rfl: 0   liraglutide (VICTOZA) 18 MG/3ML SOPN, Inject 0.3 mLs (1.8 mg total) into the skin daily., Disp: 36 mL, Rfl: 3   meclizine (ANTIVERT) 25 MG tablet, Take 1 tablet (25 mg total) by mouth 3 (three) times daily as needed for dizziness., Disp: 30 tablet, Rfl: 0   montelukast (SINGULAIR) 10 MG tablet, Take 1 tablet (10 mg total) by mouth daily., Disp: 90 tablet, Rfl: 0   naproxen (NAPROSYN) 500 MG tablet, Take 0.5 tablets (250 mg total) by mouth 3 (three) times daily as needed. (Patient taking differently: Take 500 mg by mouth 2 (two) times daily with a meal. ), Disp: 200 tablet, Rfl: 4   omeprazole (PRILOSEC) 20 MG capsule, Take 1 capsule (20 mg total) by mouth daily., Disp: 30 capsule, Rfl: 0   ondansetron (ZOFRAN) 4 MG tablet, Take 1 tablet (4 mg total) by mouth 2 (two) times daily., Disp: 20 tablet, Rfl: 1   OVER THE COUNTER MEDICATION, Apply 1 application topically daily. Triderma MD, Disp: , Rfl:    tamsulosin (FLOMAX) 0.4 MG CAPS capsule, Take 1 capsule (0.4 mg total) by mouth daily., Disp: 7 capsule, Rfl: 0   tiZANidine (ZANAFLEX) 4 MG tablet, Take 1 tablet (4 mg total) by mouth every 8 (eight) hours as needed for muscle  spasms., Disp: 30 tablet, Rfl: 0   traZODone (DESYREL) 50 MG tablet, Take 1 tablet (50 mg total) by mouth at bedtime., Disp: 90 tablet, Rfl: 4   triamcinolone (NASACORT) 55 MCG/ACT AERO nasal inhaler, Place 2 sprays into the nose daily., Disp: 3 Inhaler, Rfl: 4  Allergies  Allergen Reactions   Nitrofuran Derivatives Rash    jittery   Latex    Strawberry Flavor    Tape  Rash    I personally reviewed active problem list, medication list, allergies, health maintenance, notes from last encounter, lab results with the patient/caregiver today.   ROS  Ten systems reviewed and is negative except as mentioned in HPI   Objective  Virtual encounter, vitals not obtained.  There is no height or weight on file to calculate BMI.  Physical Exam  Pulmonary/Chest: Effort normal. No respiratory distress. Speaking in complete sentences Neurological: Pt is alert and oriented to person, place, and time. Speech is normal. Psychiatric: Patient has a normal mood and affect. behavior is normal. Judgment and thought content normal.  No results found for this or any previous visit (from the past 72 hour(s)).  PHQ2/9: Depression screen [facility] 2/9 10/23/2019 06/20/2019 02/09/2019 01/26/2019  Decreased Interest 0 0 0 0  Down, Depressed, Hopeless 0 0 0 0  PHQ - 2 Score 0 0 0 0  Altered sleeping 0 0 0 -  Tired, decreased energy 0 0 0 -  Change in appetite 0 0 0 -  Feeling bad or failure about yourself  0 0 0 -  Trouble concentrating 0 0 0 -  Moving slowly or fidgety/restless 0 0 0 -  Suicidal thoughts 0 0 0 -  PHQ-9 Score 0 0 0 -  Difficult doing work/chores Not difficult at all Not difficult at all Not difficult at all -   PHQ-2/9 Result is negative.    Fall Risk: Fall Risk  10/23/2019 06/20/2019 02/09/2019 01/26/2019  Falls in the past year? 0 0 0 0  Number falls in past yr: 0 0 0 0  Injury with Fall? 0 0 0 0  Follow up Falls evaluation completed - Falls evaluation completed -    Assessment &  Plan  1. Suspected COVID-19 virus infection - guaiFENesin (MUCINEX) 600 MG 12 hr tablet; Take 1 tablet (600 mg total) by mouth 2 (two) times daily.  Dispense: 20 tablet; Refill: 0 - promethazine-dextromethorphan (PROMETHAZINE-DM) 6.25-15 MG/5ML syrup; Take 5 mLs by mouth 4 (four) times daily as needed.  Dispense: 118 mL; Refill: 0 - albuterol (PROVENTIL) (2.5 MG/3ML) 0.083% nebulizer solution; Take 3 mLs (2.5 mg total) by nebulization every 6 (six) hours as needed for wheezing or shortness of breath.  Dispense: 150 mL; Refill: 1 - budesonide-formoterol (SYMBICORT) 80-4.5 MCG/ACT inhaler; Inhale 2 puffs into the lungs 2 (two) times daily.  Dispense: 1 Inhaler; Refill: 3 - fluticasone (FLONASE) 50 MCG/ACT nasal spray; Place 2 sprays into both nostrils daily.  Dispense: 16 g; Refill: 6  2. Type 2 diabetes mellitus with hyperglycemia, without long-term current use of insulin (HCC) - Sick day care discussed in great detail; due for follow up and labs as soon as she is recovered.  3. Rheumatoid arthritis, involving unspecified site, unspecified whether rheumatoid factor present (HCC) - Skipping enbrel for 1 more week until need for antibiotic is better determined.   4. History of migraine - Resolved after last night.  5. Immunocompromised state due to drug therapy - DM and RA on enbrel - optimizing as best as possible; sick day care is discussed in great detail.   I discussed the assessment and treatment plan with the patient. The patient was provided an opportunity to ask questions and all were answered. The patient agreed with the plan and demonstrated an understanding of the instructions.   The patient was advised to call back or seek an in-person evaluation if the symptoms worsen or if the condition fails to improve as anticipated.  I provided  21 minutes of non-face-to-face time during this encounter.  Doren Custard, FNP

## 2019-10-24 ENCOUNTER — Other Ambulatory Visit: Payer: Self-pay | Admitting: Family Medicine

## 2019-10-24 ENCOUNTER — Telehealth: Payer: Self-pay | Admitting: Pharmacist

## 2019-10-24 DIAGNOSIS — E039 Hypothyroidism, unspecified: Secondary | ICD-10-CM

## 2019-10-24 LAB — NOVEL CORONAVIRUS, NAA: SARS-CoV-2, NAA: DETECTED — AB

## 2019-10-24 NOTE — Telephone Encounter (Signed)
10/24/2019 9:59:02 AM - Symbicort to patient & provider  10/24/2019 Received a pharmacy printout for new med-Symbicort 160-4.41mcg Inhale 2 puffs into the lungs two times a day, printed [place] application--mailing provider their portion to sign & return, also putting patient's in bag with meds on the wall-for patient to sign & return,AJ

## 2019-10-31 ENCOUNTER — Ambulatory Visit (INDEPENDENT_AMBULATORY_CARE_PROVIDER_SITE_OTHER): Payer: Self-pay | Admitting: Family Medicine

## 2019-10-31 ENCOUNTER — Encounter: Payer: Self-pay | Admitting: Family Medicine

## 2019-10-31 ENCOUNTER — Other Ambulatory Visit: Payer: Self-pay

## 2019-10-31 VITALS — BP 112/72 | HR 78 | Temp 97.6°F | Ht 66.0 in | Wt 249.6 lb

## 2019-10-31 DIAGNOSIS — U071 COVID-19: Secondary | ICD-10-CM

## 2019-10-31 MED ORDER — PROMETHAZINE-DM 6.25-15 MG/5ML PO SYRP: 5.0000 mL | ORAL_SOLUTION | Freq: Four times a day (QID) | ORAL | 0 refills | Status: DC | PRN

## 2019-10-31 MED ORDER — BENZONATATE 100 MG PO CAPS: 100.0000 mg | ORAL_CAPSULE | Freq: Three times a day (TID) | ORAL | 0 refills | Status: DC | PRN

## 2019-10-31 MED ORDER — GUAIFENESIN ER 600 MG PO TB12: 600.0000 mg | ORAL_TABLET | Freq: Two times a day (BID) | ORAL | 0 refills | Status: DC

## 2019-10-31 NOTE — Progress Notes (Signed)
Name: Ashley Cochran   MRN: [id number]    DOB: [birth year]   Date:10/31/2019       Progress Note  Subjective  Chief Complaint  Chief Complaint  Patient presents with  . Covid 19    positive results    I connected with  Ashley Cochran on 10/31/19 at  2:00 PM EST by telephone and verified that I am speaking with the correct person using two identifiers.   I discussed the limitations, risks, security and privacy concerns of performing an evaluation and management service by telephone and the availability of in person appointments. Staff also discussed with the patient that there may be a patient responsible charge related to this service. Patient Location: Home Provider Location: Office Additional Individuals present: None  HPI  Pt presents to follow up on COVID-19 infection and ongoing symptoms. She is still having some phlegm and cough.  Denies any fever (did in the beginning) or chills, headaches have resolved, GI upset, chest pain.  Shortness of breath is helped with the albuterol and symbicort that she has been taking.  She never received the promethazine DM, and the tessalon has only helped slightly.  We will resend today.  She has not been taking mucinex.  She has been using flonase before be and this does help with her congestion.  Patient Active Problem List   Diagnosis Date Noted  . Class 3 severe obesity due to excess calories with serious comorbidity and body mass index (BMI) of 40.0 to 44.9 in adult (HCC) 01/26/2019  . Type 2 diabetes mellitus with hyperglycemia, without long-term current use of insulin (HCC) 01/26/2019  . Dyslipidemia associated with type 2 diabetes mellitus (HCC) 01/26/2019  . Hypothyroidism 04/09/2015  . Rheumatoid arthritis (HCC) 04/09/2015    Social History   Tobacco Use  . Smoking status: Former Smoker    Quit date: 09/20/2004    Years since quitting: 15.1  . Smokeless tobacco: Never Used  Substance Use Topics  . Alcohol use: No      Current Outpatient Medications:  .  albuterol (PROVENTIL HFA;VENTOLIN HFA) 108 (90 Base) MCG/ACT inhaler, Inhale 2 puffs into the lungs every 6 (six) hours as needed for wheezing or shortness of breath., Disp: 3 Inhaler, Rfl: 4 .  albuterol (PROVENTIL) (2.5 MG/3ML) 0.083% nebulizer solution, Take 3 mLs (2.5 mg total) by nebulization every 6 (six) hours as needed for wheezing or shortness of breath., Disp: 150 mL, Rfl: 1 .  aspirin-acetaminophen-caffeine (EXCEDRIN MIGRAINE) 250-250-65 MG tablet, Take by mouth., Disp: , Rfl:  .  baclofen (LIORESAL) 20 MG tablet, Take 20 mg by mouth., Disp: , Rfl:  .  budesonide-formoterol (SYMBICORT) 80-4.5 MCG/ACT inhaler, Inhale 2 puffs into the lungs 2 (two) times daily., Disp: 1 Inhaler, Rfl: 3 .  cyclobenzaprine (FLEXERIL) 10 MG tablet, Take 10 mg by mouth as needed for muscle spasms (every 8 hours as needed for spasms.)., Disp: , Rfl:  .  empagliflozin (JARDIANCE) 25 MG TABS tablet, Take 25 mg by mouth daily., Disp: 90 tablet, Rfl: 1 .  Etanercept 50 MG/ML SOCT, Inject 1 Dose into the skin once a week., Disp: , Rfl:  .  fluticasone (FLONASE) 50 MCG/ACT nasal spray, Place 2 sprays into both nostrils daily., Disp: 16 g, Rfl: 6 .  guaiFENesin (MUCINEX) 600 MG 12 hr tablet, Take 1 tablet (600 mg total) by mouth 2 (two) times daily., Disp: 20 tablet, Rfl: 0 .  Insulin Degludec (TRESIBA) 100 UNIT/ML SOLN, Inject 10 Units into the skin daily.,  Disp: 10 mL, Rfl: 3 .  levothyroxine (SYNTHROID) 175 MCG tablet, TAKE ONE TABLET BY MOUTH EVERY DAY, Disp: 30 tablet, Rfl: 0 .  liraglutide (VICTOZA) 18 MG/3ML SOPN, Inject 0.3 mLs (1.8 mg total) into the skin daily., Disp: 36 mL, Rfl: 3 .  meclizine (ANTIVERT) 25 MG tablet, Take 1 tablet (25 mg total) by mouth 3 (three) times daily as needed for dizziness., Disp: 30 tablet, Rfl: 0 .  montelukast (SINGULAIR) 10 MG tablet, Take 1 tablet (10 mg total) by mouth daily., Disp: 90 tablet, Rfl: 0 .  naproxen (NAPROSYN) 500 MG tablet,  Take 0.5 tablets (250 mg total) by mouth 3 (three) times daily as needed. (Patient taking differently: Take 500 mg by mouth 2 (two) times daily with a meal. ), Disp: 200 tablet, Rfl: 4 .  omeprazole (PRILOSEC) 20 MG capsule, Take 1 capsule (20 mg total) by mouth daily., Disp: 30 capsule, Rfl: 0 .  ondansetron (ZOFRAN) 4 MG tablet, Take 1 tablet (4 mg total) by mouth 2 (two) times daily., Disp: 20 tablet, Rfl: 1 .  OVER THE COUNTER MEDICATION, Apply 1 application topically daily. Triderma MD, Disp: , Rfl:  .  promethazine-dextromethorphan (PROMETHAZINE-DM) 6.25-15 MG/5ML syrup, Take 5 mLs by mouth 4 (four) times daily as needed., Disp: 118 mL, Rfl: 0 .  tamsulosin (FLOMAX) 0.4 MG CAPS capsule, Take 1 capsule (0.4 mg total) by mouth daily., Disp: 7 capsule, Rfl: 0 .  tiZANidine (ZANAFLEX) 4 MG tablet, Take 1 tablet (4 mg total) by mouth every 8 (eight) hours as needed for muscle spasms., Disp: 30 tablet, Rfl: 0 .  traZODone (DESYREL) 50 MG tablet, Take 1 tablet (50 mg total) by mouth at bedtime., Disp: 90 tablet, Rfl: 4 .  triamcinolone (NASACORT) 55 MCG/ACT AERO nasal inhaler, Place 2 sprays into the nose daily., Disp: 3 Inhaler, Rfl: 4  Allergies  Allergen Reactions  . Nitrofuran Derivatives Rash    jittery  . Latex   . Strawberry Flavor   . Tape Rash    I personally reviewed active problem list, medication list, allergies, notes from last encounter, lab results with the patient/caregiver today.  ROS  Ten systems reviewed and is negative except as mentioned in HPI  Objective  Virtual encounter, vitals not obtained.  Body mass index is 40.29 kg/m.  Nursing Note and Vital Signs reviewed.  Physical Exam  Pulmonary/Chest: Effort normal. No respiratory distress. Speaking in complete sentences Neurological: Pt is alert and oriented to person, place, and time. Coordination, speech and gait are normal.  Psychiatric: Patient has a normal mood and affect. behavior is normal. Judgment and  thought content normal.  No results found for this or any previous visit (from the past 72 hour(s)).  Assessment & Plan  1. COVID-19 virus infection - promethazine-dextromethorphan (PROMETHAZINE-DM) 6.25-15 MG/5ML syrup; Take 5 mLs by mouth 4 (four) times daily as needed.  Dispense: 118 mL; Refill: 0 - guaiFENesin (MUCINEX) 600 MG 12 hr tablet; Take 1 tablet (600 mg total) by mouth 2 (two) times daily.  Dispense: 20 tablet; Refill: 0 - benzonatate (TESSALON PERLES) 100 MG capsule; Take 1 capsule (100 mg total) by mouth 3 (three) times daily as needed. Do not take if taking promethazine-DM within 8 hours.  Dispense: 20 capsule; Refill: 0  -Red flags and when to present for emergency care or RTC including fever >101.33F, chest pain, shortness of breath, new/worsening/un-resolving symptoms, reviewed with patient at time of visit. Follow up and care instructions discussed and provided in AVS. -  I discussed the assessment and treatment plan with the patient. The patient was provided an opportunity to ask questions and all were answered. The patient agreed with the plan and demonstrated an understanding of the instructions.  - The patient was advised to call back or seek an in-person evaluation if the symptoms worsen or if the condition fails to improve as anticipated.  I provided 12 minutes of non-face-to-face time during this encounter.  Hubbard Hartshorn, FNP

## 2019-11-14 ENCOUNTER — Other Ambulatory Visit: Payer: Self-pay | Admitting: Family Medicine

## 2019-11-14 DIAGNOSIS — Z8719 Personal history of other diseases of the digestive system: Secondary | ICD-10-CM

## 2019-11-21 ENCOUNTER — Ambulatory Visit (INDEPENDENT_AMBULATORY_CARE_PROVIDER_SITE_OTHER): Payer: Self-pay | Admitting: Family Medicine

## 2019-11-21 ENCOUNTER — Encounter: Payer: Self-pay | Admitting: Family Medicine

## 2019-11-21 ENCOUNTER — Other Ambulatory Visit: Payer: Self-pay

## 2019-11-21 VITALS — BP 122/79 | HR 93 | Temp 97.8°F | Ht 66.0 in | Wt 247.4 lb

## 2019-11-21 DIAGNOSIS — G43009 Migraine without aura, not intractable, without status migrainosus: Secondary | ICD-10-CM

## 2019-11-21 DIAGNOSIS — J0100 Acute maxillary sinusitis, unspecified: Secondary | ICD-10-CM

## 2019-11-21 DIAGNOSIS — D84821 Immunodeficiency due to drugs: Secondary | ICD-10-CM

## 2019-11-21 DIAGNOSIS — Z79899 Other long term (current) drug therapy: Secondary | ICD-10-CM

## 2019-11-21 MED ORDER — AMOXICILLIN-POT CLAVULANATE 875-125 MG PO TABS: 1.0000 | ORAL_TABLET | Freq: Two times a day (BID) | ORAL | 0 refills | Status: AC

## 2019-11-21 MED ORDER — PROMETHAZINE HCL 12.5 MG PO TABS: 12.5000 mg | ORAL_TABLET | Freq: Three times a day (TID) | ORAL | 0 refills | Status: DC | PRN

## 2019-11-21 NOTE — Progress Notes (Signed)
Name: Ashley Cochran   MRN: [id number]    DOB: [birth year]   Date:11/21/2019       Progress Note  Subjective  Chief Complaint  Chief Complaint  Patient presents with  . Nausea  . Migraine    I connected with  [name]  on 11/21/19 at  7:20 AM EST by a video enabled telemedicine application and verified that I am speaking with the correct person using two identifiers.  I discussed the limitations of evaluation and management by telemedicine and the availability of in person appointments. The patient expressed understanding and agreed to proceed. Staff also discussed with the patient that there may be a patient responsible charge related to this service. Patient Location: Home Provider Location: Office Additional Individuals present: None  HPI  Pt presents with concern for migraines - a 2 days ago she had a migraine with nausea.  Of note she did have recent COVID+ diagnosis and reports sinus congestion had initially improved, but started to worsen again around the same time as the migraine onset, also having sinus pain and pressure.  Her headache pain starts frontal, and radiates back into the neck, described as constant, but is now improving.  She endorses having had photosensitivity and phonosensitivity the last 2 days.  Notes headache is significantly improved today, but nausea is still mild to moderate.  She has long history of migraines throughout her life.  Taking Zofran without relief of nausea; has been taking acetaminophen for her headache.  She does have baclofen on hand at home, but has not taken it.  Denies vision changes, weakness/numbness/tingling in extremities, facial droop, confusion, or slurred speech, fevers/chills.  Patient Active Problem List   Diagnosis Date Noted  . Class 3 severe obesity due to excess calories with serious comorbidity and body mass index (BMI) of 40.0 to 44.9 in adult ([place]) 01/26/2019  . Type 2 diabetes mellitus with hyperglycemia, without  long-term current use of insulin ([place]) 01/26/2019  . Dyslipidemia associated with type 2 diabetes mellitus ([place]) 01/26/2019  . Hypothyroidism 04/09/2015  . Rheumatoid arthritis ([place]) 04/09/2015    Social History   Tobacco Use  . Smoking status: Former Smoker    Quit date: 09/20/2004    Years since quitting: 15.1  . Smokeless tobacco: Never Used  Substance Use Topics  . Alcohol use: No     Current Outpatient Medications:  .  albuterol (PROVENTIL HFA;VENTOLIN HFA) 108 (90 Base) MCG/ACT inhaler, Inhale 2 puffs into the lungs every 6 (six) hours as needed for wheezing or shortness of breath., Disp: 3 Inhaler, Rfl: 4 .  albuterol (PROVENTIL) (2.5 MG/3ML) 0.083% nebulizer solution, Take 3 mLs (2.5 mg total) by nebulization every 6 (six) hours as needed for wheezing or shortness of breath., Disp: 150 mL, Rfl: 1 .  aspirin-acetaminophen-caffeine (EXCEDRIN MIGRAINE) 250-250-65 MG tablet, Take by mouth., Disp: , Rfl:  .  baclofen (LIORESAL) 20 MG tablet, Take 20 mg by mouth., Disp: , Rfl:  .  benzonatate (TESSALON PERLES) 100 MG capsule, Take 1 capsule (100 mg total) by mouth 3 (three) times daily as needed. Do not take if taking promethazine-DM within 8 hours., Disp: 20 capsule, Rfl: 0 .  budesonide-formoterol (SYMBICORT) 80-4.5 MCG/ACT inhaler, Inhale 2 puffs into the lungs 2 (two) times daily., Disp: 1 Inhaler, Rfl: 3 .  cyclobenzaprine (FLEXERIL) 10 MG tablet, Take 10 mg by mouth as needed for muscle spasms (every 8 hours as needed for spasms.)., Disp: , Rfl:  .  empagliflozin (JARDIANCE) 25 MG  TABS tablet, Take 25 mg by mouth daily., Disp: 90 tablet, Rfl: 1 .  Etanercept 50 MG/ML SOCT, Inject 1 Dose into the skin once a week., Disp: , Rfl:  .  fluticasone (FLONASE) 50 MCG/ACT nasal spray, Place 2 sprays into both nostrils daily., Disp: 16 g, Rfl: 6 .  Insulin Degludec (TRESIBA) 100 UNIT/ML SOLN, Inject 10 Units into the skin daily., Disp: 10 mL, Rfl: 3 .  levothyroxine (SYNTHROID) 175 MCG  tablet, TAKE ONE TABLET BY MOUTH EVERY DAY, Disp: 30 tablet, Rfl: 0 .  liraglutide (VICTOZA) 18 MG/3ML SOPN, Inject 0.3 mLs (1.8 mg total) into the skin daily., Disp: 36 mL, Rfl: 3 .  montelukast (SINGULAIR) 10 MG tablet, Take 1 tablet (10 mg total) by mouth daily., Disp: 90 tablet, Rfl: 0 .  naproxen (NAPROSYN) 500 MG tablet, Take 0.5 tablets (250 mg total) by mouth 3 (three) times daily as needed. (Patient taking differently: Take 500 mg by mouth 2 (two) times daily with a meal. ), Disp: 200 tablet, Rfl: 4 .  omeprazole (PRILOSEC) 20 MG capsule, TAKE ONE CAPSULE BY MOUTH EVERY DAY, Disp: 30 capsule, Rfl: 0 .  ondansetron (ZOFRAN) 4 MG tablet, Take 1 tablet (4 mg total) by mouth 2 (two) times daily., Disp: 20 tablet, Rfl: 1 .  OVER THE COUNTER MEDICATION, Apply 1 application topically daily. Triderma MD, Disp: , Rfl:  .  promethazine-dextromethorphan (PROMETHAZINE-DM) 6.25-15 MG/5ML syrup, Take 5 mLs by mouth 4 (four) times daily as needed., Disp: 118 mL, Rfl: 0 .  tamsulosin (FLOMAX) 0.4 MG CAPS capsule, Take 1 capsule (0.4 mg total) by mouth daily., Disp: 7 capsule, Rfl: 0 .  tiZANidine (ZANAFLEX) 4 MG tablet, Take 1 tablet (4 mg total) by mouth every 8 (eight) hours as needed for muscle spasms., Disp: 30 tablet, Rfl: 0 .  traZODone (DESYREL) 50 MG tablet, Take 1 tablet (50 mg total) by mouth at bedtime., Disp: 90 tablet, Rfl: 4 .  guaiFENesin (MUCINEX) 600 MG 12 hr tablet, Take 1 tablet (600 mg total) by mouth 2 (two) times daily. (Patient not taking: Reported on 11/21/2019), Disp: 20 tablet, Rfl: 0 .  meclizine (ANTIVERT) 25 MG tablet, Take 1 tablet (25 mg total) by mouth 3 (three) times daily as needed for dizziness. (Patient not taking: Reported on 11/21/2019), Disp: 30 tablet, Rfl: 0  Allergies  Allergen Reactions  . Nitrofuran Derivatives Rash    jittery  . Latex   . Strawberry Flavor   . Tape Rash    I personally reviewed active problem list, medication list, allergies, notes from last  encounter, lab results with the patient/caregiver today.  ROS  Ten systems reviewed and is negative except as mentioned in HPI  Objective  Virtual encounter, vitals not obtained.  Body mass index is 39.93 kg/m.  Nursing Note and Vital Signs reviewed.  Physical Exam  HENT: She notes some tenderness to the maxillary sinuses bilaterally. Pulmonary/Chest: Effort normal. No respiratory distress. Speaking in complete sentences Neurological: Pt is alert and oriented to person, place, and time. Speech is normal. Psychiatric: Patient has a normal mood and affect. behavior is normal. Judgment and thought content normal.  No results found for this or any previous visit (from the past 72 hour(s)).  Assessment & Plan  1. Migraine without aura and without status migrainosus, not intractable - Improving, though nausea is still persistent.  Will STOP promethazine DM syrup and Zofran and try phenergan tablets today to help with nausea.  - promethazine (PHENERGAN) 12.5 MG tablet;  Take 1 tablet (12.5 mg total) by mouth every 8 (eight) hours as needed for nausea or vomiting. *DO NOT TAKE WITH Promethazine-DM cough syrup*  Dispense: 10 tablet; Refill: 0  2. Acute non-recurrent maxillary sinusitis - Second sickening described by patient, concerns sinusitis may have caused migraine headache as above. Will treat as below. - amoxicillin-clavulanate (AUGMENTIN) 875-125 MG tablet; Take 1 tablet by mouth 2 (two) times daily for 10 days.  Dispense: 20 tablet; Refill: 0  3. Immunocompromised state due to drug therapy - She restarted her Etanercept 2-3 days ago, advised to stop again until finished with Augmentin and feeling completely back to normal as this can cause her to be immunocompromised.  She verbalizes understanding, will hold medication.  -Red flags and when to present for emergency care or RTC including fever >101.92F, chest pain, shortness of breath, new/worsening/un-resolving symptoms, reviewed  with patient at time of visit. Follow up and care instructions discussed and provided in AVS. - I discussed the assessment and treatment plan with the patient. The patient was provided an opportunity to ask questions and all were answered. The patient agreed with the plan and demonstrated an understanding of the instructions.  I provided 18 minutes of non-face-to-face time during this encounter.  Doren Custard, FNP

## 2019-12-21 ENCOUNTER — Telehealth: Admit: 2019-12-21 | Discharge: 2019-12-22

## 2019-12-21 DIAGNOSIS — Z5181 Encounter for therapeutic drug level monitoring: Secondary | ICD-10-CM

## 2019-12-21 DIAGNOSIS — M059 Rheumatoid arthritis with rheumatoid factor, unspecified: Principal | ICD-10-CM

## 2019-12-21 DIAGNOSIS — Z79899 Other long term (current) drug therapy: Principal | ICD-10-CM

## 2019-12-26 ENCOUNTER — Telehealth: Payer: Self-pay | Admitting: Pharmacist

## 2019-12-26 NOTE — Telephone Encounter (Signed)
12/26/2019 9:44:08 AM - Victoza & tips refill to [facility]  12/26/2019 [facility] refill for [employer] 1.8mg  once daily #4 boxes, also Novo fine 32G tips #2 boxes. [name]

## 2019-12-28 ENCOUNTER — Other Ambulatory Visit: Payer: Self-pay

## 2019-12-28 ENCOUNTER — Other Ambulatory Visit (HOSPITAL_COMMUNITY)
Admission: RE | Admit: 2019-12-28 | Discharge: 2019-12-28 | Disposition: A | Discharge status: Home / Self Care | Payer: Self-pay | Source: Ambulatory Visit | Attending: Family Medicine | Admitting: Family Medicine

## 2019-12-28 ENCOUNTER — Ambulatory Visit (INDEPENDENT_AMBULATORY_CARE_PROVIDER_SITE_OTHER): Payer: BC Managed Care – PPO | Admitting: Family Medicine

## 2019-12-28 ENCOUNTER — Encounter: Payer: Self-pay | Admitting: Family Medicine

## 2019-12-28 ENCOUNTER — Encounter: Admit: 2019-12-28 | Discharge: 2019-12-29 | Payer: MEDICARE

## 2019-12-28 ENCOUNTER — Ambulatory Visit: Admit: 2019-12-28 | Discharge: 2019-12-29 | Payer: MEDICARE

## 2019-12-28 VITALS — BP 124/76 | HR 81 | Temp 97.5°F | Resp 18 | Ht 66.0 in | Wt 252.1 lb

## 2019-12-28 DIAGNOSIS — Z5181 Encounter for therapeutic drug level monitoring: Secondary | ICD-10-CM

## 2019-12-28 DIAGNOSIS — Z79899 Other long term (current) drug therapy: Secondary | ICD-10-CM

## 2019-12-28 DIAGNOSIS — M059 Rheumatoid arthritis with rheumatoid factor, unspecified: Principal | ICD-10-CM

## 2019-12-28 DIAGNOSIS — Z113 Encounter for screening for infections with a predominantly sexual mode of transmission: Secondary | ICD-10-CM

## 2019-12-28 DIAGNOSIS — Z01419 Encounter for gynecological examination (general) (routine) without abnormal findings: Secondary | ICD-10-CM | POA: Diagnosis not present

## 2019-12-28 DIAGNOSIS — E039 Hypothyroidism, unspecified: Secondary | ICD-10-CM

## 2019-12-28 DIAGNOSIS — E1165 Type 2 diabetes mellitus with hyperglycemia: Secondary | ICD-10-CM | POA: Diagnosis not present

## 2019-12-28 DIAGNOSIS — E785 Hyperlipidemia, unspecified: Secondary | ICD-10-CM

## 2019-12-28 DIAGNOSIS — Z6841 Body Mass Index (BMI) 40.0 and over, adult: Secondary | ICD-10-CM

## 2019-12-28 DIAGNOSIS — E1169 Type 2 diabetes mellitus with other specified complication: Secondary | ICD-10-CM | POA: Diagnosis not present

## 2019-12-28 DIAGNOSIS — Z1159 Encounter for screening for other viral diseases: Secondary | ICD-10-CM

## 2019-12-28 NOTE — Progress Notes (Signed)
Name: Ashley Cochran   MRN: [id number]    DOB: [birth year]   Date:12/28/2019       Progress Note  Subjective  Chief Complaint  Chief Complaint  Patient presents with  . Annual Exam    HPI  Patient presents for annual CPE.  Diet: Does not eat a lot of meat, does eat some fresh fruits and vegetables. Exercise: Not exercising - education is provided.  Does do a lot of wood working when she is not at work   USPSTF grade A and B recommendations    Office Visit from 12/28/2019 in [facility]  AUDIT-C Score  0    Rarely uses ETOH  Depression: Phq 9 is  Negative. Sleep is disturbed from new work schedule. Depression screen [facility] 2/9 12/28/2019 11/21/2019 10/31/2019 10/23/2019 06/20/2019  Decreased Interest 0 0 0 0 0  Down, Depressed, Hopeless 0 0 0 0 0  PHQ - 2 Score 0 0 0 0 0  Altered sleeping 3 1 0 0 0  Tired, decreased energy 0 1 0 0 0  Change in appetite 0 0 0 0 0  Feeling bad or failure about yourself  0 0 0 0 0  Trouble concentrating 0 0 0 0 0  Moving slowly or fidgety/restless 0 0 0 0 0  Suicidal thoughts 0 0 0 0 0  PHQ-9 Score 3 2 0 0 0  Difficult doing work/chores Not difficult at all Not difficult at all Not difficult at all Not difficult at all Not difficult at all   Hypertension: BP Readings from Last 3 Encounters:  11/21/19 122/79  10/31/19 112/72  06/20/19 128/70   Obesity: Wt Readings from Last 3 Encounters:  12/28/19 252 lb 1.6 oz (114.4 kg)  11/21/19 247 lb 6.4 oz (112.2 kg)  10/31/19 249 lb 9.6 oz (113.2 kg)   BMI Readings from Last 3 Encounters:  12/28/19 40.69 kg/m  11/21/19 39.93 kg/m  10/31/19 40.29 kg/m     Hep C Screening: Will obtain today STD testing and prevention (HIV/chl/gon/syphilis): Will check HIV, declines additional testing Intimate partner violence: No concerns Sexual History (Partners/Practices/Protection from [employer] hx STI/Pregnancy Plans): Not currently sexually active. Pain during Intercourse: No  concerns Menstrual History/LMP/Abnormal Bleeding: Menses have started to become a bit more irregular - her mother went through menopause in her 75's.  She does note occasional heat intolerance. Incontinence Symptoms: No concerns  Breast cancer:  - Last Mammogram: Never had mammogram - BRCA gene screening: Maternal aunt - lumpectomy only  Osteoporosis: Discussed high calcium and vitamin D supplementation, weight bearing exercises  Cervical cancer screening: Overdue for Pap  Skin cancer: Discussed monitoring for atypical lesions.  No concerning lesions noted by patient today Colorectal cancer: Denies known family or personal history of colorectal cancer, no changes in BM's - no blood in stool, dark and tarry stool, mucus in stool, or constipation/diarrhea.   Lung cancer:  Former smoker - quit 12 years ago. Low Dose CT Chest recommended if Age 71-80 years, 30 pack-year currently smoking OR have quit w/in 15years. Patient does not qualify.   ECG: Denies chest pain or palpitations.  Does have occasional shortness of breath when wearing a mask for a long period of time, takes inhaler and takes a few minutes without mask on and things improve.  Advanced Care Planning: A voluntary discussion about advance care planning including the explanation and discussion of advance directives.  Discussed health care proxy and Living will, and the patient was able to identify  a health care proxy as Sister Alyse Low [place]).  Patient does not have a living will at present time. If patient does have living will, I have requested they bring this to the clinic to be scanned in to their chart.  Lipids: Lab Results  Component Value Date   CHOL 147 12/08/2018   CHOL 149 07/26/2018   CHOL 154 01/12/2018   Lab Results  Component Value Date   HDL 33 (L) 12/08/2018   HDL 38 (L) 07/26/2018   HDL 39 (L) 01/12/2018   Lab Results  Component Value Date   LDLCALC 64 12/08/2018   LDLCALC 78 07/26/2018   LDLCALC 89  01/12/2018   Lab Results  Component Value Date   TRIG 248 (H) 12/08/2018   TRIG 165 (H) 07/26/2018   TRIG 129 01/12/2018   Lab Results  Component Value Date   CHOLHDL 4.5 (H) 12/08/2018   CHOLHDL 3.9 07/26/2018   CHOLHDL 3.9 01/12/2018   No results found for: LDLDIRECT  Glucose: Glucose  Date Value Ref Range Status  12/08/2018 393 (H) 65 - 99 mg/dL Final  07/26/2018 243 (H) 65 - 99 mg/dL Final  01/12/2018 192 (H) 65 - 99 mg/dL Final  02/02/2014 180 (H) 65 - 99 mg/dL Final   Glucose, Bld  Date Value Ref Range Status  06/20/2019 330 (H) 65 - 99 mg/dL Final    Comment:    .            Fasting reference interval . For someone without known diabetes, a glucose value >125 mg/dL indicates that they may have diabetes and this should be confirmed with a follow-up test. .   07/22/2018 251 (H) 70 - 99 mg/dL Final  05/06/2015 145 (H) 65 - 99 mg/dL Final    Patient Active Problem List   Diagnosis Date Noted  . Class 3 severe obesity due to excess calories with serious comorbidity and body mass index (BMI) of 40.0 to 44.9 in adult ([place]) 01/26/2019  . Type 2 diabetes mellitus with hyperglycemia, without long-term current use of insulin ([place]) 01/26/2019  . Dyslipidemia associated with type 2 diabetes mellitus ([place]) 01/26/2019  . Hypothyroidism 04/09/2015  . Rheumatoid arthritis ([place]) 04/09/2015    No past surgical history on file.  Family History  Problem Relation Age of Onset  . Hypertension Mother   . Diabetes Mother        type 2  . Asthma Mother   . Stroke Mother        Mini strokes  . GER disease Mother   . Hypertension Father   . Diabetes Father        Type 2  . Asthma Father   . GER disease Father   . Asthma Sister   . Allergies Sister   . Asthma Brother   . Allergies Brother     Social History   Socioeconomic History  . Marital status: Legally Separated    Spouse name: Not on file  . Number of children: Not on file  . Years of education: Not on  file  . Highest education level: Not on file  Occupational History  . Not on file  Tobacco Use  . Smoking status: Former Smoker    Quit date: 09/20/2004    Years since quitting: 15.2  . Smokeless tobacco: Never Used  Substance and Sexual Activity  . Alcohol use: No  . Drug use: No  . Sexual activity: Not Currently    Partners: Male  Other Topics Concern  .  Not on file  Social History Narrative  . Not on file   Social Determinants of Health   Financial Resource Strain: Medium Risk  . Difficulty of Paying Living Expenses: Somewhat hard  Food Insecurity:   . Worried About Charity fundraiser in the Last Year: Not on file  . Ran Out of Food in the Last Year: Not on file  Transportation Needs:   . Lack of Transportation (Medical): Not on file  . Lack of Transportation (Non-Medical): Not on file  Physical Activity: Inactive  . Days of Exercise per Week: 0 days  . Minutes of Exercise per Session: 0 min  Stress: Stress Concern Present  . Feeling of Stress : To some extent  Social Connections: Unknown  . Frequency of Communication with Friends and Family: Not on file  . Frequency of Social Gatherings with Friends and Family: Once a week  . Attends Religious Services: Never  . Active Member of Clubs or Organizations: Not on file  . Attends Archivist Meetings: Not on file  . Marital Status: Not on file  Intimate Partner Violence:   . Fear of Current or Ex-Partner: Not on file  . Emotionally Abused: Not on file  . Physically Abused: Not on file  . Sexually Abused: Not on file     Current Outpatient Medications:  .  albuterol (PROVENTIL HFA;VENTOLIN HFA) 108 (90 Base) MCG/ACT inhaler, Inhale 2 puffs into the lungs every 6 (six) hours as needed for wheezing or shortness of breath., Disp: 3 Inhaler, Rfl: 4 .  albuterol (PROVENTIL) (2.5 MG/3ML) 0.083% nebulizer solution, Take 3 mLs (2.5 mg total) by nebulization every 6 (six) hours as needed for wheezing or shortness of  breath., Disp: 150 mL, Rfl: 1 .  baclofen (LIORESAL) 20 MG tablet, Take 20 mg by mouth., Disp: , Rfl:  .  benzonatate (TESSALON PERLES) 100 MG capsule, Take 1 capsule (100 mg total) by mouth 3 (three) times daily as needed. Do not take if taking promethazine-DM within 8 hours., Disp: 20 capsule, Rfl: 0 .  budesonide-formoterol (SYMBICORT) 80-4.5 MCG/ACT inhaler, Inhale 2 puffs into the lungs 2 (two) times daily., Disp: 1 Inhaler, Rfl: 3 .  empagliflozin (JARDIANCE) 25 MG TABS tablet, Take 25 mg by mouth daily., Disp: 90 tablet, Rfl: 1 .  Etanercept 50 MG/ML SOCT, Inject 1 Dose into the skin once a week., Disp: , Rfl:  .  fluticasone (FLONASE) 50 MCG/ACT nasal spray, Place 2 sprays into both nostrils daily., Disp: 16 g, Rfl: 6 .  Insulin Degludec (TRESIBA) 100 UNIT/ML SOLN, Inject 10 Units into the skin daily., Disp: 10 mL, Rfl: 3 .  levothyroxine (SYNTHROID) 175 MCG tablet, TAKE ONE TABLET BY MOUTH EVERY DAY, Disp: 30 tablet, Rfl: 0 .  montelukast (SINGULAIR) 10 MG tablet, Take 1 tablet (10 mg total) by mouth daily., Disp: 90 tablet, Rfl: 0 .  naproxen (NAPROSYN) 500 MG tablet, Take 0.5 tablets (250 mg total) by mouth 3 (three) times daily as needed. (Patient taking differently: Take 500 mg by mouth 2 (two) times daily with a meal. ), Disp: 200 tablet, Rfl: 4 .  omeprazole (PRILOSEC) 20 MG capsule, TAKE ONE CAPSULE BY MOUTH EVERY DAY, Disp: 30 capsule, Rfl: 0 .  ondansetron (ZOFRAN) 4 MG tablet, Take 1 tablet (4 mg total) by mouth 2 (two) times daily., Disp: 20 tablet, Rfl: 1 .  OVER THE COUNTER MEDICATION, Apply 1 application topically daily. Triderma MD, Disp: , Rfl:  .  promethazine (PHENERGAN) 12.5 MG  tablet, Take 1 tablet (12.5 mg total) by mouth every 8 (eight) hours as needed for nausea or vomiting. *DO NOT TAKE WITH Promethazine-DM cough syrup*, Disp: 10 tablet, Rfl: 0 .  tamsulosin (FLOMAX) 0.4 MG CAPS capsule, Take 1 capsule (0.4 mg total) by mouth daily., Disp: 7 capsule, Rfl: 0 .   tiZANidine (ZANAFLEX) 4 MG tablet, Take 1 tablet (4 mg total) by mouth every 8 (eight) hours as needed for muscle spasms., Disp: 30 tablet, Rfl: 0 .  traZODone (DESYREL) 50 MG tablet, Take 1 tablet (50 mg total) by mouth at bedtime., Disp: 90 tablet, Rfl: 4 .  liraglutide (VICTOZA) 18 MG/3ML SOPN, Inject 0.3 mLs (1.8 mg total) into the skin daily., Disp: 36 mL, Rfl: 3  Allergies  Allergen Reactions  . Nitrofuran Derivatives Rash    jittery  . Latex   . Strawberry Flavor   . Tape Rash     ROS  Constitutional: Negative for fever or weight change.  Respiratory: Negative for cough and shortness of breath.   Cardiovascular: Negative for chest pain or palpitations.  Gastrointestinal: Negative for abdominal pain, no bowel changes.  Musculoskeletal: Negative for gait problem or joint swelling.  Skin: Negative for rash.  Neurological: Negative for dizziness or headache.  No other specific complaints in a complete review of systems (except as listed in HPI above).  Objective  Vitals:   12/28/19 1032  Pulse: 81  Resp: 18  Temp: (!) 97.5 F (36.4 C)  TempSrc: Temporal  SpO2: 99%  Weight: 252 lb 1.6 oz (114.4 kg)  Height: '5\' 6"'  (1.676 m)    Body mass index is 40.69 kg/m.  Physical Exam  Constitutional: Patient appears well-developed and well-nourished. No distress.  HENT: Head: Normocephalic and atraumatic. Ears: B TMs ok, no erythema or effusion; Nose: Nose normal. Mouth/Throat: Oropharynx is clear and moist. No oropharyngeal exudate.  Eyes: Conjunctivae and EOM are normal. Pupils are equal, round, and reactive to light. No scleral icterus.  Neck: Normal range of motion. Neck supple. No JVD present. No thyromegaly present.  Cardiovascular: Normal rate, regular rhythm and normal heart sounds.  No murmur heard. No BLE edema. Pulmonary/Chest: Effort normal and breath sounds normal. No respiratory distress. Abdominal: Soft. Bowel sounds are normal, no distension. There is no  tenderness. no masses Breast: no lumps or masses, no nipple discharge or rashes FEMALE GENITALIA:  External genitalia normal External urethra normal Vaginal vault normal without discharge or lesions Cervix normal without discharge or lesions Bimanual exam normal without masses Musculoskeletal: Normal range of motion, no joint effusions. No gross deformities Neurological: he is alert and oriented to person, place, and time. No cranial nerve deficit. Coordination, balance, strength, speech and gait are normal.  Skin: Skin is warm and dry. No rash noted. No erythema.  Psychiatric: Patient has a normal mood and affect. behavior is normal. Judgment and thought content normal.   Recent Results (from the past 2160 hour(s))  CBC with Differential     Status: Abnormal   Collection Time: 10/16/19  9:02 AM  Result Value Ref Range   WBC 7.3 3.8 - 10.8 Thousand/uL   RBC 5.58 (H) 3.80 - 5.10 Million/uL   Hemoglobin 14.6 11.7 - 15.5 g/dL   HCT 45.3 (H) 35.0 - 45.0 %   MCV 81.2 80.0 - 100.0 fL   MCH 26.2 (L) 27.0 - 33.0 pg   MCHC 32.2 32.0 - 36.0 g/dL   RDW 13.3 11.0 - 15.0 %   Platelets 276 140 - 400  Thousand/uL   MPV 10.1 7.5 - 12.5 fL   Neutro Abs 3,920 1,500 - 7,800 cells/uL   Lymphs Abs 2,781 850 - 3,900 cells/uL   Absolute Monocytes 416 200 - 950 cells/uL   Eosinophils Absolute 131 15 - 500 cells/uL   Basophils Absolute 51 0 - 200 cells/uL   Neutrophils Relative % 53.7 %   Total Lymphocyte 38.1 %   Monocytes Relative 5.7 %   Eosinophils Relative 1.8 %   Basophils Relative 0.7 %  Novel Coronavirus, NAA ([facility])     Status: Abnormal   Collection Time: 10/23/19  9:44 AM   Specimen: Nasopharyngeal(NP) swabs in vial transport medium   NASOPHARYNGE  TESTING  Result Value Ref Range   SARS-CoV-2, NAA Detected (A) Not Detected    Comment: This nucleic acid amplification test was developed and its performance characteristics determined by [employer]. Nucleic acid amplification  tests include PCR and TMA. This test has not been FDA cleared or approved. This test has been authorized by FDA under an Emergency Use Authorization (EUA). This test is only authorized for the duration of time the declaration that circumstances exist justifying the authorization of the emergency use of in vitro diagnostic tests for detection of SARS-CoV-2 virus and/or diagnosis of COVID-19 infection under section 564(b)(1) of the Act, 21 U.S.C. 300PQZ-3(A) (1), unless the authorization is terminated or revoked sooner. When diagnostic testing is negative, the possibility of a false negative result should be considered in the context of a patient's recent exposures and the presence of clinical signs and symptoms consistent with COVID-19. An individual without symptoms of COVID-19 and who is not shedding SARS-CoV-2 virus would  expect to have a negative (not detected) result in this assay.      Fall Risk: Fall Risk  12/28/2019 11/21/2019 10/31/2019 10/23/2019 06/20/2019  Falls in the past year? 0 0 0 0 0  Number falls in past yr: 0 0 0 0 0  Injury with Fall? 0 0 0 0 0  Follow up Falls evaluation completed Falls evaluation completed Falls evaluation completed Falls evaluation completed -   Assessment & Plan 1. Well woman exam with routine gynecological exam -USPSTF grade A and B recommendations reviewed with patient; age-appropriate recommendations, preventive care, screening tests, etc discussed and encouraged; healthy living encouraged; see AVS for patient education given to patient -Discussed importance of 150 minutes of physical activity weekly, eat two servings of fish weekly, eat one serving of tree nuts ( cashews, pistachios, pecans, almonds.Marland Kitchen) every other day, eat 6 servings of fruit/vegetables daily and drink plenty of water and avoid sweet beverages.  - Hepatitis C antibody - HIV Antibody (routine testing w rflx) - TSH - COMPLETE METABOLIC PANEL WITH GFR - CBC with  Differential/Platelet - Lipid panel - Hemoglobin A1c - MM 3D SCREEN BREAST BILATERAL; Future - Cytology - PAP  2. Acquired hypothyroidism - TSH  3. Type 2 diabetes mellitus with hyperglycemia, without long-term current use of insulin ([facility]) - COMPLETE METABOLIC PANEL WITH GFR - Hemoglobin A1c  4. Dyslipidemia associated with type 2 diabetes mellitus ([place]) - Lipid panel  5. Class 3 severe obesity due to excess calories with serious comorbidity and body mass index (BMI) of 40.0 to 44.9 in adult ([facility]) - CBC with Differential/Platelet  6. Routine screening for STI (sexually transmitted infection) - HIV Antibody (routine testing w rflx)  7. Encounter for hepatitis C screening test for low risk patient - Hepatitis C antibody  8. Rheumatoid arthritis with positive rheumatoid factor, involving  unspecified site ([place]) - COMPLETE METABOLIC PANEL WITH GFR - CBC with Differential/Platelet

## 2019-12-29 LAB — CBC WITH DIFFERENTIAL/PLATELET
Absolute Monocytes: 608 {cells}/uL (ref 200–950)
Basophils Absolute: 70 {cells}/uL (ref 0–200)
Basophils Relative: 0.6 %
Eosinophils Absolute: 164 {cells}/uL (ref 15–500)
Eosinophils Relative: 1.4 %
HCT: 39.4 % (ref 35.0–45.0)
Hemoglobin: 12.9 g/dL (ref 11.7–15.5)
Lymphs Abs: 3171 {cells}/uL (ref 850–3900)
MCH: 26.5 pg — ABNORMAL LOW (ref 27.0–33.0)
MCHC: 32.7 g/dL (ref 32.0–36.0)
MCV: 81.1 fL (ref 80.0–100.0)
MPV: 10.6 fL (ref 7.5–12.5)
Monocytes Relative: 5.2 %
Neutro Abs: 7687 {cells}/uL (ref 1500–7800)
Neutrophils Relative %: 65.7 %
Platelets: 297 Thousand/uL (ref 140–400)
RBC: 4.86 Million/uL (ref 3.80–5.10)
RDW: 13.7 % (ref 11.0–15.0)
Total Lymphocyte: 27.1 %
WBC: 11.7 Thousand/uL — ABNORMAL HIGH (ref 3.8–10.8)

## 2019-12-29 LAB — COMPLETE METABOLIC PANEL WITH GFR
AG Ratio: 1 (calc) (ref 1.0–2.5)
ALT: 18 U/L (ref 6–29)
AST: 9 U/L — ABNORMAL LOW (ref 10–35)
Albumin: 3.7 g/dL (ref 3.6–5.1)
Alkaline phosphatase (APISO): 111 U/L (ref 31–125)
BUN: 12 mg/dL (ref 7–25)
CO2: 24 mmol/L (ref 20–32)
Calcium: 9 mg/dL (ref 8.6–10.2)
Chloride: 104 mmol/L (ref 98–110)
Creat: 0.58 mg/dL (ref 0.50–1.10)
GFR, Est African American: 129 mL/min/{1.73_m2} (ref >=60)
GFR, Est Non African American: 111 mL/min/{1.73_m2} (ref >=60)
Globulin: 3.6 g/dL (calc) (ref 1.9–3.7)
Glucose, Bld: 215 mg/dL — ABNORMAL HIGH (ref 65–99)
Potassium: 4.1 mmol/L (ref 3.5–5.3)
Sodium: 136 mmol/L (ref 135–146)
Total Bilirubin: 0.4 mg/dL (ref 0.2–1.2)
Total Protein: 7.3 g/dL (ref 6.1–8.1)

## 2019-12-29 LAB — CYTOLOGY - PAP
Comment: NEGATIVE
Diagnosis: NEGATIVE
High risk HPV: NEGATIVE

## 2019-12-29 LAB — HEPATITIS C ANTIBODY
Hepatitis C Ab: NONREACTIVE
SIGNAL TO CUT-OFF: 0.17 (ref <1.00)

## 2019-12-29 LAB — LIPID PANEL
Cholesterol: 155 mg/dL
HDL: 42 mg/dL — ABNORMAL LOW
LDL Cholesterol (Calc): 88 mg/dL
Non-HDL Cholesterol (Calc): 113 mg/dL
Total CHOL/HDL Ratio: 3.7 (calc)
Triglycerides: 153 mg/dL — ABNORMAL HIGH

## 2019-12-29 LAB — TSH: TSH: 3.07 mIU/L

## 2019-12-29 LAB — HEMOGLOBIN A1C
Hgb A1c MFr Bld: 8.1 % of total Hgb — ABNORMAL HIGH (ref <5.7)
Mean Plasma Glucose: 186 (calc)
eAG (mmol/L): 10.3 (calc)

## 2019-12-29 LAB — HIV ANTIBODY (ROUTINE TESTING W REFLEX): HIV 1&2 Ab, 4th Generation: NONREACTIVE

## 2020-01-03 ENCOUNTER — Other Ambulatory Visit: Payer: Self-pay

## 2020-01-03 ENCOUNTER — Encounter: Payer: Self-pay | Admitting: Family Medicine

## 2020-01-03 ENCOUNTER — Ambulatory Visit (INDEPENDENT_AMBULATORY_CARE_PROVIDER_SITE_OTHER): Payer: BC Managed Care – PPO | Admitting: Family Medicine

## 2020-01-03 DIAGNOSIS — M059 Rheumatoid arthritis with rheumatoid factor, unspecified: Secondary | ICD-10-CM | POA: Diagnosis not present

## 2020-01-03 DIAGNOSIS — E785 Hyperlipidemia, unspecified: Secondary | ICD-10-CM

## 2020-01-03 DIAGNOSIS — E1165 Type 2 diabetes mellitus with hyperglycemia: Secondary | ICD-10-CM | POA: Diagnosis not present

## 2020-01-03 DIAGNOSIS — E039 Hypothyroidism, unspecified: Secondary | ICD-10-CM | POA: Diagnosis not present

## 2020-01-03 DIAGNOSIS — Z8719 Personal history of other diseases of the digestive system: Secondary | ICD-10-CM

## 2020-01-03 DIAGNOSIS — Z6841 Body Mass Index (BMI) 40.0 and over, adult: Secondary | ICD-10-CM

## 2020-01-03 DIAGNOSIS — E1169 Type 2 diabetes mellitus with other specified complication: Secondary | ICD-10-CM

## 2020-01-03 MED ORDER — LEVOTHYROXINE SODIUM 175 MCG PO TABS: ORAL_TABLET | ORAL | 1 refills | Status: DC

## 2020-01-03 MED ORDER — OMEPRAZOLE 20 MG PO CPDR: 20.0000 mg | DELAYED_RELEASE_CAPSULE | Freq: Every day | ORAL | 1 refills | Status: DC

## 2020-01-03 MED ORDER — JARDIANCE 25 MG PO TABS: 25.0000 mg | ORAL_TABLET | Freq: Every day | ORAL | 1 refills | Status: DC

## 2020-01-03 NOTE — Progress Notes (Signed)
Name: Ashley Cochran   MRN: [id number]    DOB: Aug 02, 1976   Date:01/03/2020       Progress Note  Subjective  Chief Complaint  Chief Complaint  Patient presents with  . Follow-up    1 week follow up    I connected with  Ashley Cochran on 01/03/20 at 10:40 AM EST by telephone and verified that I am speaking with the correct person using two identifiers.  I discussed the limitations, risks, security and privacy concerns of performing an evaluation and management service by telephone and the availability of in person appointments. Staff also discussed with the patient that there may be a patient responsible charge related to this service. Patient Location: Home Provider Location: home Office Additional Individuals present: None  HPI  RA:  Seeing Dr. Graylon Good with [facility]. Back on Enbrel and is in less pain with this.  She is not able to exercise outside of work secondary to pain.  Last visit was 12/20/20.  Hypothyroidism: History: hypothyroidism Current Medication Regimen: 126mcg synthroid Current Symptoms: denies fatigue, weight changes, heat/cold intolerance, bowel/skin changes or CVS symptoms Most recent results are below: Lab Results  Component Value Date   TSH 3.07 12/28/2019   HLD:  Most recent LDL was 88 - discussed goal to be <70 with DM.  She does not want to statin right now; wants to work on diet and decrease saturated fats.  Denies chest pain. The 10-year ASCVD risk score Mikey Bussing DC Brooke Bonito., et al., 2013) is: 1.5%   Values used to calculate the score:     Age: 44 years     Sex: Female     Is Non-Hispanic African American: No     Diabetic: Yes     Tobacco smoker: No     Systolic Blood Pressure: 485 mmHg     Is BP treated: No     HDL Cholesterol: 42 mg/dL     Total Cholesterol: 155 mg/dL  DM: A1C 8.1%.  She is taking 1.8mg  Victoza - has missed 2-3 doses a week and 10 units Tresiba - has missed 2-3 doses of this in a week as well.  Rx Jardiance 25mg  and is  compliant with this.  Discussed compliance in detail - she is committed to improving her compliance. Urine micro due at next visit; not on ACE/ARB - most recent kidney function was WNL.  Not on statin, see above regarding this.   Obesity: Not exercising; diet has been improved with lower fat and less fast food recently.   GERD: Taking omeprazole once daily - she has tried coming off in the past but was unable to do so.  On 20mg  once daily.  Denies swallowing difficulty, abdominal pain, blood in stool/dark and tarry stools  Patient Active Problem List   Diagnosis Date Noted  . Class 3 severe obesity due to excess calories with serious comorbidity and body mass index (BMI) of 40.0 to 44.9 in adult ([place]) 01/26/2019  . Type 2 diabetes mellitus with hyperglycemia, without long-term current use of insulin ([place]) 01/26/2019  . Dyslipidemia associated with type 2 diabetes mellitus ([place]) 01/26/2019  . Hypothyroidism 04/09/2015  . Rheumatoid arthritis ([place]) 04/09/2015    No past surgical history on file.  Family History  Problem Relation Age of Onset  . Hypertension Mother   . Diabetes Mother        type 2  . Asthma Mother   . Stroke Mother        Mini strokes  .  GER disease Mother   . Hypertension Father   . Diabetes Father        Type 2  . Asthma Father   . GER disease Father   . Asthma Sister   . Allergies Sister   . Asthma Brother   . Allergies Brother     Social History   Socioeconomic History  . Marital status: Legally Separated    Spouse name: Not on file  . Number of children: Not on file  . Years of education: Not on file  . Highest education level: Not on file  Occupational History  . Not on file  Tobacco Use  . Smoking status: Former Smoker    Quit date: 09/20/2004    Years since quitting: 15.2  . Smokeless tobacco: Never Used  Substance and Sexual Activity  . Alcohol use: No  . Drug use: No  . Sexual activity: Not Currently    Partners: Male  Other Topics  Concern  . Not on file  Social History Narrative  . Not on file   Social Determinants of Health   Financial Resource Strain: Medium Risk  . Difficulty of Paying Living Expenses: Somewhat hard  Food Insecurity:   . Worried About Programme researcher, broadcasting/film/video in the Last Year: Not on file  . Ran Out of Food in the Last Year: Not on file  Transportation Needs:   . Lack of Transportation (Medical): Not on file  . Lack of Transportation (Non-Medical): Not on file  Physical Activity: Inactive  . Days of Exercise per Week: 0 days  . Minutes of Exercise per Session: 0 min  Stress: Stress Concern Present  . Feeling of Stress : To some extent  Social Connections: Unknown  . Frequency of Communication with Friends and Family: Not on file  . Frequency of Social Gatherings with Friends and Family: Once a week  . Attends Religious Services: Never  . Active Member of Clubs or Organizations: Not on file  . Attends Banker Meetings: Not on file  . Marital Status: Not on file  Intimate Partner Violence:   . Fear of Current or Ex-Partner: Not on file  . Emotionally Abused: Not on file  . Physically Abused: Not on file  . Sexually Abused: Not on file     Current Outpatient Medications:  .  albuterol (PROVENTIL HFA;VENTOLIN HFA) 108 (90 Base) MCG/ACT inhaler, Inhale 2 puffs into the lungs every 6 (six) hours as needed for wheezing or shortness of breath., Disp: 3 Inhaler, Rfl: 4 .  albuterol (PROVENTIL) (2.5 MG/3ML) 0.083% nebulizer solution, Take 3 mLs (2.5 mg total) by nebulization every 6 (six) hours as needed for wheezing or shortness of breath., Disp: 150 mL, Rfl: 1 .  baclofen (LIORESAL) 20 MG tablet, Take 20 mg by mouth., Disp: , Rfl:  .  benzonatate (TESSALON PERLES) 100 MG capsule, Take 1 capsule (100 mg total) by mouth 3 (three) times daily as needed. Do not take if taking promethazine-DM within 8 hours., Disp: 20 capsule, Rfl: 0 .  budesonide-formoterol (SYMBICORT) 80-4.5 MCG/ACT  inhaler, Inhale 2 puffs into the lungs 2 (two) times daily., Disp: 1 Inhaler, Rfl: 3 .  empagliflozin (JARDIANCE) 25 MG TABS tablet, Take 25 mg by mouth daily., Disp: 90 tablet, Rfl: 1 .  Etanercept 50 MG/ML SOCT, Inject 1 Dose into the skin once a week., Disp: , Rfl:  .  fluticasone (FLONASE) 50 MCG/ACT nasal spray, Place 2 sprays into both nostrils daily., Disp: 16 g, Rfl:  6 .  Insulin Degludec (TRESIBA) 100 UNIT/ML SOLN, Inject 10 Units into the skin daily., Disp: 10 mL, Rfl: 3 .  levothyroxine (SYNTHROID) 175 MCG tablet, TAKE ONE TABLET BY MOUTH EVERY DAY, Disp: 30 tablet, Rfl: 0 .  liraglutide (VICTOZA) 18 MG/3ML SOPN, Inject 0.3 mLs (1.8 mg total) into the skin daily., Disp: 36 mL, Rfl: 3 .  montelukast (SINGULAIR) 10 MG tablet, Take 1 tablet (10 mg total) by mouth daily., Disp: 90 tablet, Rfl: 0 .  naproxen (NAPROSYN) 500 MG tablet, Take 0.5 tablets (250 mg total) by mouth 3 (three) times daily as needed. (Patient taking differently: Take 500 mg by mouth 2 (two) times daily with a meal. ), Disp: 200 tablet, Rfl: 4 .  omeprazole (PRILOSEC) 20 MG capsule, TAKE ONE CAPSULE BY MOUTH EVERY DAY, Disp: 30 capsule, Rfl: 0 .  ondansetron (ZOFRAN) 4 MG tablet, Take 1 tablet (4 mg total) by mouth 2 (two) times daily., Disp: 20 tablet, Rfl: 1 .  OVER THE COUNTER MEDICATION, Apply 1 application topically daily. Lenon Oms MD, Disp: , Rfl:  .  promethazine (PHENERGAN) 12.5 MG tablet, Take 1 tablet (12.5 mg total) by mouth every 8 (eight) hours as needed for nausea or vomiting. *DO NOT TAKE WITH Promethazine-DM cough syrup*, Disp: 10 tablet, Rfl: 0 .  tamsulosin (FLOMAX) 0.4 MG CAPS capsule, Take 1 capsule (0.4 mg total) by mouth daily., Disp: 7 capsule, Rfl: 0 .  tiZANidine (ZANAFLEX) 4 MG tablet, Take 1 tablet (4 mg total) by mouth every 8 (eight) hours as needed for muscle spasms., Disp: 30 tablet, Rfl: 0 .  traZODone (DESYREL) 50 MG tablet, Take 1 tablet (50 mg total) by mouth at bedtime., Disp: 90 tablet,  Rfl: 4  Allergies  Allergen Reactions  . Nitrofuran Derivatives Rash    jittery  . Latex   . Strawberry Flavor   . Tape Rash    I personally reviewed active problem list, medication list, allergies, notes from last encounter, lab results with the patient/caregiver today.   ROS  Constitutional: Negative for fever or weight change.  Respiratory: Negative for cough and shortness of breath.   Cardiovascular: Negative for chest pain or palpitations.  Gastrointestinal: Negative for abdominal pain, no bowel changes.  Musculoskeletal: Negative for gait problem or joint swelling.  Skin: Negative for rash.  Neurological: Negative for dizziness or headache.  No other specific complaints in a complete review of systems (except as listed in HPI above).  Objective  Virtual encounter, vitals not obtained.  There is no height or weight on file to calculate BMI.  Physical Exam Pulmonary/Chest: Effort normal. No respiratory distress. Speaking in complete sentences Neurological: Pt is alert and oriented to person, place, and time. Speech is normal. Psychiatric: Patient has a normal mood and affect. behavior is normal. Judgment and thought content normal.  No results found for this or any previous visit (from the past 72 hour(s)).  PHQ2/9: Depression screen [facility] 2/9 01/03/2020 12/28/2019 11/21/2019 10/31/2019 10/23/2019  Decreased Interest 0 0 0 0 0  Down, Depressed, Hopeless 0 0 0 0 0  PHQ - 2 Score 0 0 0 0 0  Altered sleeping 0 3 1 0 0  Tired, decreased energy 0 0 1 0 0  Change in appetite 0 0 0 0 0  Feeling bad or failure about yourself  0 0 0 0 0  Trouble concentrating 0 0 0 0 0  Moving slowly or fidgety/restless 0 0 0 0 0  Suicidal thoughts 0 0 0  0 0  PHQ-9 Score 0 3 2 0 0  Difficult doing work/chores Not difficult at all Not difficult at all Not difficult at all Not difficult at all Not difficult at all   PHQ-2/9 Result is negative.    Fall Risk: Fall Risk  01/03/2020 12/28/2019  11/21/2019 10/31/2019 10/23/2019  Falls in the past year? 0 0 0 0 0  Number falls in past yr: 0 0 0 0 0  Injury with Fall? 0 0 0 0 0  Follow up Falls evaluation completed Falls evaluation completed Falls evaluation completed Falls evaluation completed Falls evaluation completed    Assessment & Plan  1. Uncontrolled type 2 diabetes mellitus with hyperglycemia (HCC) - Strongly encouraged better compliance with tresiba and ozempic - no dose changes today as I believe with improved compliance she will get to goal.  She is in agreement. - empagliflozin (JARDIANCE) 25 MG TABS tablet; Take 25 mg by mouth daily.  Dispense: 90 tablet; Refill: 1  2. Rheumatoid arthritis with positive rheumatoid factor, involving unspecified site [facility]) - Seeing rheumatology; taking enbrel  3. Class 3 severe obesity due to excess calories with serious comorbidity and body mass index (BMI) of 40.0 to 44.9 in adult [facility]) - Discussed with the patient the risk posed by an increased BMI. Discussed importance of portion control, calorie counting and at least 150 minutes of physical activity weekly. Avoid sweet beverages and drink more water. Eat at least 6 servings of fruit and vegetables daily   4. Hypothyroidism, unspecified type - levothyroxine (SYNTHROID) 175 MCG tablet; TAKE ONE TABLET BY MOUTH EVERY DAY  Dispense: 90 tablet; Refill: 1  5. Dyslipidemia associated with type 2 diabetes mellitus (HCC) - Declines low dose statin today, wants to work on diet and exercise first  6. H/O gastroesophageal reflux (GERD) - omeprazole (PRILOSEC) 20 MG capsule; Take 1 capsule (20 mg total) by mouth daily.  Dispense: 90 capsule; Refill: 1   I discussed the assessment and treatment plan with the patient. The patient was provided an opportunity to ask questions and all were answered. The patient agreed with the plan and demonstrated an understanding of the instructions.   The patient was advised to call back or seek an in-person  evaluation if the symptoms worsen or if the condition fails to improve as anticipated.  I provided 22 minutes of non-face-to-face time during this encounter.  Doren Custard, FNP

## 2020-01-22 ENCOUNTER — Telehealth: Payer: Self-pay | Admitting: Pharmacist

## 2020-01-22 NOTE — Telephone Encounter (Signed)
01/22/2020 11:52:17 AM - [id number] refill to dr  01/22/2020 Interoffice provider Danelle Berry, PA @ [facility] [employer] refill request for Ashley Cochran U100 Inject 10 units once daily #1 box.[name]

## 2020-01-25 MED ORDER — NAPROXEN 500 MG TABLET
ORAL_TABLET | Freq: Two times a day (BID) | ORAL | 1 refills | 30.00000 days | Status: CP | PRN
Start: 2020-01-25 — End: ?

## 2020-01-26 ENCOUNTER — Telehealth: Payer: Self-pay | Admitting: Pharmacy Technician

## 2020-01-26 NOTE — Telephone Encounter (Signed)
Patient stated that she has prescription drug coverage with [employer].  Patient acknowledged that she understood that she no longer meets [facility] eligibility requirements to receive medication assistance.  Sherilyn Dacosta Care Manager [facility]

## 2020-02-20 DIAGNOSIS — M059 Rheumatoid arthritis with rheumatoid factor, unspecified: Principal | ICD-10-CM

## 2020-02-20 MED ORDER — ETANERCEPT 50 MG/ML (1 ML) SUBCUTANEOUS PEN INJECTOR
SUBCUTANEOUS | 3 refills | 84 days | Status: CP
Start: 2020-02-20 — End: ?
  Filled 2020-04-11: qty 4, 28d supply, fill #0

## 2020-02-20 MED ORDER — EMPTY CONTAINER
2 refills | 0 days
Start: 2020-02-20 — End: ?

## 2020-03-07 NOTE — Unmapped (Signed)
[facility] Specialty Medication Onboarding    Specialty Medication: ENBREL  Prior Authorization: Approved   Financial Assistance: No - patient must call for financial assistance per MAPs referral PER REFERRAL PATIENT WAS SENT COPAY CARD, BUT [facility] WAS NEVER ABLE TO OBTAIN INFORMATION FROM PATIENT AFTER 4 ATTEMPTS.   Final Copay/Day Supply: $1088.68 / 28 DAYS    Insurance Restrictions: Yes - max 1 month supply     Notes to Pharmacist:     The triage team has completed the benefits investigation and has determined that the patient is able to fill this medication at [facility]. Please contact the patient to complete the onboarding or follow up with the prescribing physician as needed.

## 2020-03-08 NOTE — Unmapped (Signed)
[facility]   Patient Onboarding/Medication Counseling    Yvonne Gray is a 44 y.o. female with seropositive rheumatoid arthritis who I am counseling today on continuation of therapy.  I am speaking to the patient.    Was a Nurse, learning disability used for this call? No    Verified patient's date of birth / HIPAA.    Specialty medication(s) to be sent: Inflammatory Disorders: Enbrel      Non-specialty medications/supplies to be sent: sharps kit       Enbrel (etanercept)    Medication & Administration     Dosage: Rheumatoid arthritis: Inject 50mg  under the skin one time weekly      Lab tests required prior to treatment initiation:  ??? Tuberculosis: Tuberculosis screening resulted in a non-reactive Quantiferon TB Gold assay.  ??? Hepatitis B: Hepatitis B serology studies are complete and non-reactive.      Administration:     Prefilled auto-injector pen  1. Gather all supplies needed for injection on a clean, flat working surface: medication pen removed from packaging, alcohol swab, sharps container, etc.  2. Look at the medication label ??? look for correct medication, correct dose, and check the expiration date  3. Look at the medication ??? the liquid visible in the window on the side of the pen device should appear clear and colorless; it is ok if you see small white particles in the medicine  4. Lay the auto-injector pen on a flat surface and allow it to warm up to room temperature for at least 30 minutes  5. Select injection site ??? you can use the front of your thigh or your belly (but not the area 2 inches around your belly button); if someone else is giving you the injection you can also use your upper arm in the skin covering your triceps muscle  6. Prepare injection site ??? wash your hands and clean the skin at the injection site with an alcohol swab and let it air dry, do not touch the injection site again before the injection  7. Pull off the white  safety cap, do not remove until immediately prior to injection and do not touch the needle shield  8. Pinch the skin ??? with your hand not holding the auto-injector pinch up a fold of skin at the injection site using your forefinger and thumb to prepare a firm injection site  9. Put the needle shield against your skin at the injection site at a 90 degree angle, hold the pen such that you can see the clear medication window  10. To initiate the injection unlock the purple activation button by pressing the needle shield completely in against the injection site, then depress the purple activation button to start the injection ??? you will hear a click sound  11. Continue to hold the pen firmly against your skin for about 15 seconds ??? the window will start to turn solid yellow  12. There will be a second click sound when the injection is complete, verify the window is solid yellow before pulling the pen away from your skin  13. Dispose of the used auto-injector pen immediately in your sharps disposal container the needle will be covered automatically  14. If you see any blood at the injection site, press a cotton ball or gauze on the site and maintain pressure until the bleeding stops, do not rub the injection site      Adherence/Missed dose instructions:  If your injection is given more than 2  days after your scheduled injection date ??? consult your pharmacist for additional instructions on how to adjust your dosing schedule.      Goals of Therapy     ??? Achieve symptom remission  ??? Slow disease progression  ??? Protection of remaining articular structures  ??? Maintenance of function  ??? Maintenance of effective psychosocial functioning      Side Effects & Monitoring Parameters     ??? Injection site reaction (redness, irritation, inflammation localized to the site of administration)  ??? Signs of a common cold ??? minor sore throat, runny or stuffy nose, etc.  ??? Diarrhea    The following side effects should be reported to the provider:  ??? Signs of a hypersensitivity reaction ??? rash; hives; itching; red, swollen, blistered, or peeling skin; wheezing; tightness in the chest or throat; difficulty breathing, swallowing, or talking; swelling of the mouth, face, lips, tongue, or throat; etc.  ??? Reduced immune function ??? report signs of infection such as fever; chills; body aches; very bad sore throat; ear or sinus pain; cough; more sputum or change in color of sputum; pain with passing urine; wound that will not heal, etc.  Also at a slightly higher risk of some malignancies (mainly skin and blood cancers) due to this reduced immune function.  o In the case of signs of infection ??? the patient should hold the next dose of Enbrel?? and call your primary care provider to ensure adequate medical care.  Treatment may be resumed when infection is treated and patient is asymptomatic.  ??? Signs of unexplained bruising or bleeding ??? throwing up blood or emesis that looks like coffee grounds; black, tarry, or bloody stool; etc.  ??? Changes in skin ??? a new growth or lump that forms; changes in shape, size, or color of a previous mole or marking      Contraindications, Warnings, & Precautions     ??? Have your bloodwork checked as you have been told by your prescriber  ??? Talk with your doctor if you are pregnant, planning to become pregnant, or breastfeeding  ??? Discuss the possible need for holding your dose(s) of Enbrel?? when a planned procedure is scheduled with the prescriber as it may delay healing/recovery timeline       Drug/Food Interactions     ??? Medication list reviewed in Epic. The patient was instructed to inform the care team before taking any new medications or supplements. No drug interactions identified.   ??? If you have a latex allergy use caution when handling; the needle cap of the Enbrel?? prefilled syringe, the safety cap for the Enbrel SureClick?? pen, and the needle cover within the purple cap of the Enbrel Mini?? cartridge contains a derivative of natural rubber latex  ??? Talk with you prescriber or pharmacist before receiving any live vaccinations while taking this medication and after you stop taking it    Storage, Handling Precautions, & Disposal     ??? Store this medication in the refrigerator.  Do not freeze  ??? If needed, you may store at room temperature for up to 14 days  ??? Store in original packaging, protected from light  ??? Do not shake  ??? Dispose of used syringes/pens/cartridges in a sharps disposal container    The patient declined counseling on medication administration, missed dose instructions, goals of therapy, side effects and monitoring parameters, warnings and precautions, drug/food interactions and storage, handling precautions, and disposal because they have taken the medication previously. The information in the  declined sections above are for informational purposes only and was not discussed with patient.       Current Medications (including OTC/herbals), Comorbidities and Allergies     Current Outpatient Medications   Medication Sig Dispense Refill   ??? albuterol (PROVENTIL HFA;VENTOLIN HFA) 90 mcg/actuation inhaler Inhale 2 puffs every six (6) hours as needed for wheezing.     ??? aspirin-acetaminophen-caffeine (EXCEDRIN MIGRAINE) 250-250-65 mg per tablet Take 1 tablet by mouth every six (6) hours as needed for pain.     ??? baclofen (LIORESAL) 20 MG tablet Take 20 mg by mouth 4 (four) times a day as needed (spasms).     ??? empagliflozin 10 mg tablet Take 25 mg by mouth daily. Jardiance     ??? etanercept (ENBREL) injection 50 mg/mL PEN Inject the contents of 1 pen (50 mg total) under the skin every seven (7) days. 12 mL 3   ??? insulin degludec (TRESIBA FLEXTOUCH U-100) 100 unit/mL (3 mL) InPn Inject 10 Units under the skin daily.     ??? levothyroxine (SYNTHROID, LEVOTHROID) 175 MCG tablet Take 175 mcg by mouth daily. Mon and Thursday      ??? liraglutide (VICTOZA 2-PAK) 0.6 mg/0.1 mL (18 mg/3 mL) injection Inject 1.8 mg under the skin daily.      ??? montelukast (SINGULAIR) 10 mg tablet Take 10 mg by mouth nightly.     ??? naproxen (NAPROSYN) 500 MG tablet Take 1 tablet (500 mg total) by mouth two (2) times a day as needed (take with food). 60 tablet 1   ??? omeprazole (PRILOSEC) 20 MG capsule Take 20 mg by mouth daily.     ??? ondansetron (ZOFRAN) 4 MG tablet Take 4 mg by mouth every eight (8) hours as needed for nausea.     ??? oxyCODONE-acetaminophen (PERCOCET) 5-325 mg per tablet Take 1 tablet by mouth every six (6) hours as needed for pain.     ??? traZODone (DESYREL) 50 MG tablet Take 50 mg by mouth nightly as needed for sleep.       No current facility-administered medications for this visit.       Allergies   Allergen Reactions   ??? Nitrofuran Analogues Rash     jittery   ??? Nitrofuran Derivative Rash     jittery   ??? Strawberry Swelling     Facial swelling   ??? Zantac [Ranitidine Hcl]       Causes jitters   ??? Adhesive Tape-Silicones Rash   ??? Latex, Natural Rubber Rash       Patient Active Problem List   Diagnosis   ??? Rheumatoid arthritis involving multiple sites with positive rheumatoid factor (CMS-HCC)   ??? Nephrolithiasis   ??? Encounter for monitoring of etanercept therapy   ??? Rheumatoid arthritis(714.0)   ??? Seropositive rheumatoid arthritis (CMS-HCC)       Reviewed and up to date in Epic.    Appropriateness of Therapy     Is medication and dose appropriate based on diagnosis? Yes    Prescription has been clinically reviewed: Yes    Baseline Quality of Life Assessment      Rheumatology:   Quality of Life    On a scale of 1 ??? 10 with 1 representing not at all and 10 representing completely ??? how has your rheumatologic condition affected your:  Daily pain level?: decline to answer  Ability to complete your regular daily tasks (prepare meals, get dressed, etc.)?: decline to answer  Ability to participate in social or  family activities?: decline to answer         Financial Information     Medication Assistance provided: Prior Authorization    Anticipated copay of $1088.68 reviewed with patient - this copay will be covered by the Enbrel support debit card that the patient provided the billing information for. Verified delivery address.    Delivery Information     Scheduled delivery date: 04/11/2020    Expected start date: 04/11/2020    Medication will be delivered via Same Day Courier to the prescription address in [facility].  This shipment will not require a signature.      Explained the services we provide at [facility] and that each month we would call to set up refills.  Stressed importance of returning phone calls so that we could ensure they receive their medications in time each month.  Informed patient that we should be setting up refills 7-10 days prior to when they will run out of medication.  A pharmacist will reach out to perform a clinical assessment periodically.  Informed patient that a welcome packet and a drug information handout will be sent.      Patient verbalized understanding of the above information as well as how to contact the pharmacy at [phone] option 4 with any questions/concerns.  The pharmacy is open Monday through Friday 8:30am-4:30pm.  A pharmacist is available 24/7 via pager to answer any clinical questions they may have.    Patient Specific Needs     - Does the patient have any physical, cognitive, or cultural barriers? No    - Patient prefers to have medications discussed with  Patient     - Is the patient or caregiver able to read and understand education materials at a high school level or above? Yes    - Patient's primary language is  English     - Is the patient high risk? No     - Does the patient require a Care Management Plan? No     - Does the patient require physician intervention or other additional services (i.e. nutrition, smoking cessation, social work)? No      Karene Fry Marci Polito  [facility] Specialty Pharmacist

## 2020-03-21 ENCOUNTER — Ambulatory Visit
Admission: RE | Admit: 2020-03-21 | Discharge: 2020-03-21 | Disposition: A | Discharge status: Home / Self Care | Payer: BC Managed Care – PPO | Source: Ambulatory Visit | Attending: Family Medicine | Admitting: Family Medicine

## 2020-03-21 DIAGNOSIS — Z1231 Encounter for screening mammogram for malignant neoplasm of breast: Secondary | ICD-10-CM | POA: Diagnosis not present

## 2020-03-21 DIAGNOSIS — Z01419 Encounter for gynecological examination (general) (routine) without abnormal findings: Secondary | ICD-10-CM

## 2020-04-02 ENCOUNTER — Other Ambulatory Visit: Payer: Self-pay | Admitting: Family Medicine

## 2020-04-11 MED FILL — EMPTY CONTAINER: 120 days supply | Qty: 1 | Fill #0 | Status: AC

## 2020-04-11 MED FILL — EMPTY CONTAINER: 120 days supply | Qty: 1 | Fill #0

## 2020-04-11 MED FILL — ENBREL SURECLICK 50 MG/ML (1 ML) SUBCUTANEOUS PEN INJECTOR: 28 days supply | Qty: 4 | Fill #0 | Status: AC

## 2020-04-22 NOTE — Unmapped (Unsigned)
[facility]   Assessment/Plan:   44 y.o. woman with h/o seropositive RA (dx 2005), hypothyroidism, asthma and GERD presenting for follow-up of RA.     Last seen 12/21/2019 via Telemedicine by Dr. Ilsa Gray  ***    Currently maintained on Enbrel 50 mg qweekly which is being held in light of recent coronavirus and sinus infection. Managing joint sx primarily with naproxen 500 mg BID.  Coronavirus and sinus infection resolved.     ??  1. Seropositive RA - well controlled)  - Update monitoring labs: CBC w/ diff, CMP, ESR, CRP  - Will update plain films of hands and feet   - Re-start Enbrel 50 mg qweek  -- Recommend trial of OTC voltaren gel  - Counseled tylenol 1000 mg TID PRN for lower back and shoulder pain; can continue naproxen 500 mg BID; counseled gastric protection  ??  2. Healthcare Maintenance  - Immunizations:  Immunization History   Administered Date(s) Administered   ??? Influenza Vaccine Quad (IIV4 PF) 61mo+ injectable 09/21/2016, 02/21/2018   ??? PNEUMOCOCCAL POLYSACCHARIDE 23 08/09/2017   ??? Pneumococcal Conjugate 13-Valent 12/30/2015     F/U as scheduled in September with Dr. Ilsa Gray      {    Coding tips - Do not edit this text, it will delete upon signing of note!    ?? Telephone visits [phone] for Physicians and APP??s and [phone] for Non- Physician Clinicians)- Only use minutes on the phone to determine level of service.    ?? Video visits [phone]) - Use both minutes on video and pre/post minutes to determine level of service.       :[id number]}    I spent *** minutes on the real-time audio and video visit with the patient on the date of service. I spent an additional *** minutes on pre- and post-visit activities on the date of service.     The patient was not located and I was not located within 250 yards of a hospital based location during the real-time audio and video visit. The patient was physically located in [place] or a state in which I am permitted to provide care. The patient and/or parent/guardian understood that s/he may incur co-pays and cost sharing, and agreed to the telemedicine visit. The visit was reasonable and appropriate under the circumstances given the patient's presentation at the time.    The patient and/or parent/guardian has been advised of the potential risks and limitations of this mode of treatment (including, but not limited to, the absence of in-person examination) and has agreed to be treated using telemedicine. The patient's/patient's family's questions regarding telemedicine have been answered.    If the visit was completed in an ambulatory setting, the patient and/or parent/guardian has also been advised to contact their provider???s office for worsening conditions, and seek emergency medical treatment and/or call 911 if the patient deems either necessary.        ____________________________________________________________________    Primary Care Provider: Scarlette Gray, ANP     Identification: Pt self identified using name and date of birth    Patient location: ***  [address]  [place] [address]      HPI:  Yvonne Gray is a 44 y.o. woman with h/o seropositive RA (dx 2005), hypothyroidism, asthma and GERD presenting for follow-up. Of RA.     Last seen 12/21/2019 via Telemedicine by Dr. Ilsa Gray      Interim History:   ??  Enbrel?    Joint  pain? Swelling? AM stiffness?    Fever?  Illness?  Infections?    ***  She says she started naproxen 500 mg BID after last visit with Yvonne Gray. She thinks this is helping some.  She feels like her hands and feet are not swelling as much.    She continues to have some pain in her left foot / ankle.  She says her 4th and 5th digit on the left are still painful.  She says everytime she takes a step, she experience pain.     She was diagnosed with coronavirus in 09/2019. She has been off her Enbrel since then.  She says she had a pretty mild course.  She just had sinus sx for the most part.  She says that after she recovered from the coronavirus she developed a sinus infection so she was advised to stay off the Enbrel by her PCP.     She went back to work in early 11/2019.     Review of Systems:  10 system ROS performed and negative other than mentioned above    Rheumatologic History:   [name] is a woman with h/o seropositive RA (+CCP, +RF) who was diagnosed in 2005.  She followed with Dr. Gavin Gray in [place], [place] but established care with [facility] 03/2014 (several no shows occurred until 07/2015).  She was previously treated with MTX (both oral and injectable formulations), which was discontinued 2/2 GI upset.  She was then placed on  Enbrel (~2013) on which she still remains. In 02/2017, plaquenil 200 mg BID added to regimen given ongoing pain and stiffness (agent selection based on patient preference to avoid methotrexate or leflunomide given concern for further immunosuppression).  During 07/2018 visit, patient had self-discontinued plaquenil without change in symptoms.       Medical History:  Past Medical History:   Diagnosis Date   ??? Asthma    ??? GERD (gastroesophageal reflux disease)    ??? Rheumatoid arthritis(714.0)        Allergies:  Nitrofuran analogues; Nitrofuran derivative; Strawberry; Zantac [ranitidine hcl]; Adhesive tape-silicones; and Latex, natural rubber    Medications:     Current Outpatient Medications:   ???  albuterol (PROVENTIL HFA;VENTOLIN HFA) 90 mcg/actuation inhaler, Inhale 2 puffs every six (6) hours as needed for wheezing., Disp: , Rfl:   ???  aspirin-acetaminophen-caffeine (EXCEDRIN MIGRAINE) 250-250-65 mg per tablet, Take 1 tablet by mouth every six (6) hours as needed for pain., Disp: , Rfl:   ???  baclofen (LIORESAL) 20 MG tablet, Take 20 mg by mouth 4 (four) times a day as needed (spasms)., Disp: , Rfl:   ???  empagliflozin 10 mg tablet, Take 25 mg by mouth daily. Jardiance, Disp: , Rfl:   ???  empty container Misc, Use as directed, Disp: 1 each, Rfl: 2  ???  etanercept (ENBREL) injection 50 mg/mL PEN, Inject the contents of 1 pen (50mg ) under the skin every 7 days, Disp: 12 mL, Rfl: 3  ???  insulin degludec (TRESIBA FLEXTOUCH U-100) 100 unit/mL (3 mL) InPn, Inject 10 Units under the skin daily., Disp: , Rfl:   ???  levothyroxine (SYNTHROID, LEVOTHROID) 175 MCG tablet, Take 175 mcg by mouth daily. Mon and Thursday , Disp: , Rfl:   ???  liraglutide (VICTOZA 2-PAK) 0.6 mg/0.1 mL (18 mg/3 mL) injection, Inject 1.8 mg under the skin daily. , Disp: , Rfl:   ???  montelukast (SINGULAIR) 10 mg tablet, Take 10 mg by mouth nightly., Disp: , Rfl:   ???  naproxen (NAPROSYN) 500 MG tablet, Take 1 tablet (500 mg total) by mouth two (2) times a day as needed (take with food)., Disp: 60 tablet, Rfl: 1  ???  omeprazole (PRILOSEC) 20 MG capsule, Take 20 mg by mouth daily., Disp: , Rfl:   ???  ondansetron (ZOFRAN) 4 MG tablet, Take 4 mg by mouth every eight (8) hours as needed for nausea., Disp: , Rfl:   ???  oxyCODONE-acetaminophen (PERCOCET) 5-325 mg per tablet, Take 1 tablet by mouth every six (6) hours as needed for pain., Disp: , Rfl:   ???  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 Smoking status: Former Smoker     Quit date: 04/02/2010     Years since quitting: 10.0   ??? Smokeless tobacco: Never Used   Substance Use Topics   ??? Alcohol use: No     Alcohol/week: 0.0 standard drinks   ??? Drug use: No       Family History:  Family History   Problem Relation Age of Onset   ??? Hypertension Mother    ??? Diabetes Mother    ??? Urolithiasis Neg Hx          Physical Exam***  General:   Patient is well appearing, NAD   Eyes:   EOMI, and sclera aniteric. Wearing glasses   ENT:   MMM. Oropharhynx without any erythema or exudate.  No oropharyngeal exudates or ulcerations . Poor dentition.   Neck:   Supple, no cervical lymphadenopathy   Cardiopulmonary:  Speaking in full sentences; normal WOB, no cough, no wheezing   Skin:    No rash/lesions/breakdown on visible skin     Psychiatry:   Mood and affect appropriate and congruent.   Musculo Skeletal:   No obvious joint swelling in her hands or wrists.  Full ROM b/l hands, wrists, elbows and shoulders  Unable to assess foot due to complications with video   Neurological:  Cranial nerves III-XII grossly intact.  Strength grossly intact       Test Results  No visits with results within 4 Week(s) from this visit.   Latest known visit with results is:   Appointment on 12/28/2019   Component Date Value   ??? Sodium 12/28/2019 134*   ??? Potassium 12/28/2019 4.9    ??? Chloride 12/28/2019 103    ??? Anion Gap 12/28/2019 4*   ??? CO2 12/28/2019 27.0    ??? BUN 12/28/2019 12    ??? Creatinine 12/28/2019 0.50*   ??? BUN/Creatinine Ratio 12/28/2019 24    ??? EGFR CKD-EPI Non-African* 12/28/2019 >90    ??? EGFR CKD-EPI African Ame* 12/28/2019 >90    ??? Glucose 12/28/2019 128    ??? Calcium 12/28/2019 9.1    ??? Albumin 12/28/2019 3.6    ??? Total Protein 12/28/2019 7.4    ??? Total Bilirubin 12/28/2019 0.6    ??? AST 12/28/2019 28    ??? ALT 12/28/2019 29    ??? Alkaline Phosphatase 12/28/2019 132*   ??? CRP 12/28/2019 22.9*   ??? Sed Rate 12/28/2019 28*   ??? WBC 12/28/2019 9.2    ??? RBC 12/28/2019 4.99    ??? HGB 12/28/2019 13.4    ??? HCT 12/28/2019 41.5    ???  MCV 12/28/2019 83.0    ??? MCH 12/28/2019 26.9    ??? MCHC 12/28/2019 32.4    ??? RDW 12/28/2019 14.1    ??? MPV 12/28/2019 7.5    ??? Platelet 12/28/2019 257    ??? Neutrophils % 12/28/2019 60.3    ??? Lymphocytes % 12/28/2019 30.6    ??? Monocytes % 12/28/2019 4.2    ??? Eosinophils % 12/28/2019 2.1    ??? Basophils % 12/28/2019 0.6    ??? Absolute Neutrophils 12/28/2019 5.6    ??? Absolute Lymphocytes 12/28/2019 2.8    ??? Absolute Monocytes 12/28/2019 0.4    ??? Absolute Eosinophils 12/28/2019 0.2    ??? Absolute Basophils 12/28/2019 0.1    ??? Large Unstained Cells 12/28/2019 2    ??? Hypochromasia 12/28/2019 Slight*      Ref. Range 04/02/2014 10:35 05/04/2016 11:53   ANA Titer 1 Unknown  1:320   Antinuclear Antibodies (ANA) Latest Ref Range: Negative   Positive (A)   Pattern 1 (ANA) Unknown  Homogenous   ENA Screen Latest Ref Range: <0.70 ENA Units  0.50   Rheumatoid Factor Latest Ref Range: 0.0 - 15.0 [IU]/mL 26.9 (H)    CCP Antibodies Latest Ref Range: NEGATIVE  Positive    CCP IgG Antibodies Unknown 20      EXAM: XR HAND 2 VIEWS BILATERAL  DATE: 09/21/2016 3:24 PM  ACCESSION: [id number] UN  DICTATED: 09/21/2016 9:52 PM  INTERPRETATION LOCATION: [facility]    CLINICAL INDICATION: 44 years old Female with Hand pain, h/o RA-M05.79-Rheumatoid arthritis involving multiple sites with positive rheumatoid factor (RAF-HCC) ??    COMPARISON: Hand radiographs from 04/02/2014    TECHNIQUES: ??PA and Norgaard views of the hands.    FINDINGS: Subtle marginal lucencies are seen in both radial side third metacarpal heads. Articular cartilage spaces are preserved. No soft tissue swelling. Bone density is grossly normal. No other erosions. No fracture nor dislocation.      ??      Impression     Subtle cystic or on fossa erosion lucencies at the third metacarpal heads are unchanged. No other radiographic indication of erosive arthropathy at the hands or wrists.       EXAM: XR HAND 2 VIEWS BILATERAL  DATE: 02/21/2018 9:39 AM  ACCESSION: [id number] UN  DICTATED: 02/21/2018 9:40 AM  INTERPRETATION LOCATION: [facility]    CLINICAL INDICATION: 44 years old Female with h/o RAM05.79-Rheumatoid arthritis involving multiple sites with positive rheumatoid factor (CMS-HCC) ??    COMPARISON: Hand radiographs 09/21/2016.    TECHNIQUE: PA and Norgaard views of the hands.    FINDINGS:   Lucencies along the radial aspect of the carpal hands bilaterally, unchanged. Well-circumscribed rounded focus in the right hamate could reflect a intraosseous cyst or ganglion.     No focal soft tissue swelling or periarticular osteopenia is seen. There is no progressive joint space narrowing, osteophytosis or chondrocalcinosis. No new erosions are seen.      Impression     -- No change since 09/21/16. No new erosions or progressive joint space narrowing.       EXAM: XR FOOT 3 OR MORE VIEWS BILATERAL  DATE: 02/21/2018 9:38 AM  ACCESSION: [id number] UN  DICTATED: 02/21/2018 9:42 AM  INTERPRETATION LOCATION: [facility]    CLINICAL INDICATION: 44 years old Female with h/o RAM05.79-Rheumatoid arthritis involving multiple sites with positive rheumatoid factor (CMS-HCC) ??    COMPARISON: 04/02/2014    TECHNIQUE: Dorsoplantar, oblique and lateral views of the feet.    FINDINGS:   No  acute fracture. The joint spaces are maintained. No erosion or periosteal reaction. Bilateral posterior and plantar calcaneal enthesophytes. No soft tissue swelling.      Impression     No change since 04/02/2014. No new erosions or progressive joint space narrowing.

## 2020-04-29 NOTE — Unmapped (Signed)
[facility] Telemedicine Visit   Assessment/Plan:   44 y.o. woman with h/o seropositive RA (dx 2005), hypothyroidism, asthma and GERD presenting for follow-up of RA.     Last seen 12/21/2019 via Telemedicine by Dr. Ilsa Iha    Currently maintained on Enbrel 50 mg qweekly. Doing well overall.   ??  1. Seropositive RA - well controlled  - last labs stable 12/28/19  - Continue Enbrel 50 mg qweek  - Recommend OTC voltaren gel  - Counseled tylenol 1000 mg TID PRN for lower back and shoulder pain; Advised trying to decrease naproxen to once a day, counseled gastric protection  ??  2. Healthcare Maintenance  - Immunizations:  Immunization History   Administered Date(s) Administered   ??? COVID-19 VACC,MRNA,(PFIZER)(PF)(IM) 03/25/2020   ??? Influenza Vaccine Quad (IIV4 PF) 51mo+ injectable 09/21/2016, 02/21/2018   ??? PNEUMOCOCCAL POLYSACCHARIDE 23 08/09/2017   ??? Pneumococcal Conjugate 13-Valent 12/30/2015     F/U as scheduled in September with Dr. Ilsa Iha          I spent 17 minutes on the real-time audio and video visit with the patient on the date of service. I spent an additional 5 minutes on pre- and post-visit activities on the date of service.     The patient was not located and I was not located within 250 yards of a hospital based location during the real-time audio and video visit. The patient was physically located in [place] or a state in which I am permitted to provide care. The patient and/or parent/guardian understood that s/he may incur co-pays and cost sharing, and agreed to the telemedicine visit. The visit was reasonable and appropriate under the circumstances given the patient's presentation at the time.    The patient and/or parent/guardian has been advised of the potential risks and limitations of this mode of treatment (including, but not limited to, the absence of in-person examination) and has agreed to be treated using telemedicine. The patient's/patient's family's questions regarding telemedicine have been answered.    If the visit was completed in an ambulatory setting, the patient and/or parent/guardian has also been advised to contact their provider???s office for worsening conditions, and seek emergency medical treatment and/or call 911 if the patient deems either necessary.        ____________________________________________________________________    Primary Care Provider: Scarlette Slice, ANP     Identification: Pt self identified using name and date of birth    Patient location: [place]    HPI:  Yvonne Gray is a 44 y.o. woman with h/o seropositive RA (dx 2005), hypothyroidism, asthma and GERD presenting for follow-up. Of RA.     Last seen 12/21/2019 via Telemedicine by Dr. Ilsa Iha      Interim History:   Pt presents via video call for f/u today. She reports she is doing well. Her joint are doing well since restarting Enbrel at last visit. She denies any current joint pain. She does have some intermittent ankle swelling at the end of the day but no pain with this. She endorses AM stiffness which resolves within a few mins.     She is taking her Enbrel weekly and tolerating well. She is still taking Naproxen BID.     She denies any recent fever or chills. She denies any recent illness or infections. She had both doses of pfizer COVID-19 vaccine and tolerated both well. Last dose was April 26th.     Review of Systems:  10 system ROS performed and negative other than  mentioned above    Rheumatologic History:   Ms. Yvonne Gray is a woman with h/o seropositive RA (+CCP, +RF) who was diagnosed in 2005.  She followed with Dr. Gavin Potters in [place], [place] but established care with [facility] 03/2014 (several no shows occurred until 07/2015).  She was previously treated with MTX (both oral and injectable formulations), which was discontinued 2/2 GI upset.  She was then placed on  Enbrel (~2013) on which she still remains. In 02/2017, plaquenil 200 mg BID added to regimen given ongoing pain and stiffness (agent selection based on patient preference to avoid methotrexate or leflunomide given concern for further immunosuppression).  During 07/2018 visit, patient had self-discontinued plaquenil without change in symptoms.       Medical History:  Past Medical History:   Diagnosis Date   ??? Asthma    ??? GERD (gastroesophageal reflux disease)    ??? Rheumatoid arthritis(714.0)        Allergies:  Nitrofuran analogues; Nitrofuran derivative; Strawberry; Zantac [ranitidine hcl]; Adhesive tape-silicones; and Latex, natural rubber    Medications:     Current Outpatient Medications:   ???  albuterol (PROVENTIL HFA;VENTOLIN HFA) 90 mcg/actuation inhaler, Inhale 2 puffs every six (6) hours as needed for wheezing., Disp: , Rfl:   ???  aspirin-acetaminophen-caffeine (EXCEDRIN MIGRAINE) 250-250-65 mg per tablet, Take 1 tablet by mouth every six (6) hours as needed for pain., Disp: , Rfl:   ???  baclofen (LIORESAL) 20 MG tablet, Take 20 mg by mouth 4 (four) times a day as needed (spasms)., Disp: , Rfl:   ???  empagliflozin 10 mg tablet, Take 25 mg by mouth daily. Jardiance, Disp: , Rfl:   ???  empty container Misc, Use as directed, Disp: 1 each, Rfl: 2  ???  etanercept (ENBREL) injection 50 mg/mL PEN, Inject the contents of 1 pen (50mg ) under the skin every 7 days, Disp: 12 mL, Rfl: 3  ???  insulin degludec (TRESIBA FLEXTOUCH U-100) 100 unit/mL (3 mL) InPn, Inject 10 Units under the skin daily., Disp: , Rfl:   ???  levothyroxine (SYNTHROID, LEVOTHROID) 175 MCG tablet, Take 175 mcg by mouth daily. Mon and Thursday , Disp: , Rfl:   ???  liraglutide (VICTOZA 2-PAK) 0.6 mg/0.1 mL (18 mg/3 mL) injection, Inject 1.8 mg under the skin daily. , Disp: , Rfl:   ???  montelukast (SINGULAIR) 10 mg tablet, Take 10 mg by mouth nightly., Disp: , Rfl:   ???  naproxen (NAPROSYN) 500 MG tablet, Take 1 tablet (500 mg total) by mouth two (2) times a day as needed (take with food)., Disp: 60 tablet, Rfl: 1  ???  omeprazole (PRILOSEC) 20 MG capsule, Take 20 mg by mouth daily., Disp: , Rfl:   ???  ondansetron (ZOFRAN) 4 MG tablet, Take 4 mg by mouth every eight (8) hours as needed for nausea., Disp: , Rfl:   ???  oxyCODONE-acetaminophen (PERCOCET) 5-325 mg per tablet, Take 1 tablet by mouth every six (6) hours as needed for pain., Disp: , Rfl:   ???  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 Smoking status: Former Smoker     Quit date: 04/02/2010     Years since quitting: 10.0   ??? Smokeless  tobacco: Never Used   Substance Use Topics   ??? Alcohol use: No     Alcohol/week: 0.0 standard drinks   ??? Drug use: No       Family History:  Family History   Problem Relation Age of Onset   ??? Hypertension Mother    ??? Diabetes Mother    ??? Urolithiasis Neg Hx          Physical Exam  General:   Patient is well appearing, NAD   Eyes:   EOMI, and sclera aniteric. Wearing glasses   ENT:   MMM. Oropharhynx without any erythema or exudate.  No oropharyngeal exudates or ulcerations . Poor dentition.   Neck:   Supple, no cervical lymphadenopathy   Cardiopulmonary:  Speaking in full sentences; normal WOB, no cough, no wheezing   Skin:    No rash/lesions/breakdown on visible skin     Psychiatry:   Mood and affect appropriate and congruent.   Musculo Skeletal:   No obvious joint swelling in her hands or wrists.  Able to make tight fist b/l. Full ROM b/l hands, wrists, elbows and shoulders.   Neurological:  Cranial nerves III-XII grossly intact.  Strength grossly intact       Test Results  No visits with results within 4 Week(s) from this visit.   Latest known visit with results is:   Appointment on 12/28/2019   Component Date Value   ??? Sodium 12/28/2019 134*   ??? Potassium 12/28/2019 4.9    ??? Chloride 12/28/2019 103    ??? Anion Gap 12/28/2019 4*   ??? CO2 12/28/2019 27.0    ??? BUN 12/28/2019 12    ??? Creatinine 12/28/2019 0.50*   ??? BUN/Creatinine Ratio 12/28/2019 24    ??? EGFR CKD-EPI Non-African* 12/28/2019 >90    ??? EGFR CKD-EPI African Ame* 12/28/2019 >90    ??? Glucose 12/28/2019 128    ??? Calcium 12/28/2019 9.1    ??? Albumin 12/28/2019 3.6    ??? Total Protein 12/28/2019 7.4    ??? Total Bilirubin 12/28/2019 0.6    ??? AST 12/28/2019 28    ??? ALT 12/28/2019 29    ??? Alkaline Phosphatase 12/28/2019 132*   ??? CRP 12/28/2019 22.9*   ??? Sed Rate 12/28/2019 28*   ??? WBC 12/28/2019 9.2    ??? RBC 12/28/2019 4.99    ??? HGB 12/28/2019 13.4    ??? HCT 12/28/2019 41.5    ??? MCV 12/28/2019 83.0    ??? MCH 12/28/2019 26.9    ??? MCHC 12/28/2019 32.4    ??? RDW 12/28/2019 14.1    ??? MPV 12/28/2019 7.5    ??? Platelet 12/28/2019 257    ??? Neutrophils % 12/28/2019 60.3    ??? Lymphocytes % 12/28/2019 30.6    ??? Monocytes % 12/28/2019 4.2    ??? Eosinophils % 12/28/2019 2.1    ??? Basophils % 12/28/2019 0.6    ??? Absolute Neutrophils 12/28/2019 5.6    ??? Absolute Lymphocytes 12/28/2019 2.8    ??? Absolute Monocytes 12/28/2019 0.4    ??? Absolute Eosinophils 12/28/2019 0.2    ??? Absolute Basophils 12/28/2019 0.1    ??? Large Unstained Cells 12/28/2019 2    ??? Hypochromasia 12/28/2019 Slight*      Ref. Range 04/02/2014 10:35 05/04/2016 11:53   ANA Titer 1 Unknown  1:320   Antinuclear Antibodies (ANA) Latest Ref Range: Negative   Positive (A)   Pattern 1 (ANA) Unknown  Homogenous   ENA Screen Latest Ref Range: <  0.70 ENA Units  0.50   Rheumatoid Factor Latest Ref Range: 0.0 - 15.0 [IU]/mL 26.9 (H)    CCP Antibodies Latest Ref Range: NEGATIVE  Positive    CCP IgG Antibodies Unknown 20

## 2020-04-30 ENCOUNTER — Telehealth: Admit: 2020-04-30 | Discharge: 2020-05-01 | Payer: MEDICARE | Attending: Family | Primary: Family

## 2020-04-30 DIAGNOSIS — M059 Rheumatoid arthritis with rheumatoid factor, unspecified: Principal | ICD-10-CM

## 2020-04-30 NOTE — Unmapped (Signed)
[facility] Clinical Assessment & Refill Coordination Note    Yvonne Gray, [name]: 1976/09/20  Phone: [phone] (home)     All above HIPAA information was verified with patient.     Was a Nurse, learning disability used for this call? No    Specialty Medication(s):   Inflammatory Disorders: Enbrel     Current Outpatient Medications   Medication Sig Dispense Refill   ??? albuterol (PROVENTIL HFA;VENTOLIN HFA) 90 mcg/actuation inhaler Inhale 2 puffs every six (6) hours as needed for wheezing.     ??? aspirin-acetaminophen-caffeine (EXCEDRIN MIGRAINE) 250-250-65 mg per tablet Take 1 tablet by mouth every six (6) hours as needed for pain.     ??? baclofen (LIORESAL) 20 MG tablet Take 20 mg by mouth 4 (four) times a day as needed (spasms).     ??? empagliflozin 10 mg tablet Take 25 mg by mouth daily. Jardiance     ??? empty container Misc Use as directed 1 each 2   ??? etanercept (ENBREL) injection 50 mg/mL PEN Inject the contents of 1 pen (50mg ) under the skin every 7 days 12 mL 3   ??? insulin degludec (TRESIBA FLEXTOUCH U-100) 100 unit/mL (3 mL) InPn Inject 10 Units under the skin daily.     ??? levothyroxine (SYNTHROID, LEVOTHROID) 175 MCG tablet Take 175 mcg by mouth daily. Mon and Thursday      ??? liraglutide (VICTOZA 2-PAK) 0.6 mg/0.1 mL (18 mg/3 mL) injection Inject 1.8 mg under the skin daily.      ??? montelukast (SINGULAIR) 10 mg tablet Take 10 mg by mouth nightly.     ??? naproxen (NAPROSYN) 500 MG tablet Take 1 tablet (500 mg total) by mouth two (2) times a day as needed (take with food). 60 tablet 1   ??? omeprazole (PRILOSEC) 20 MG capsule Take 20 mg by mouth daily.     ??? ondansetron (ZOFRAN) 4 MG tablet Take 4 mg by mouth every eight (8) hours as needed for nausea.     ??? oxyCODONE-acetaminophen (PERCOCET) 5-325 mg per tablet Take 1 tablet by mouth every six (6) hours as needed for pain.     ??? traZODone (DESYREL) 50 MG tablet Take 50 mg by mouth nightly as needed for sleep.       No current facility-administered medications for this visit.        Changes to medications: Mack[place]ize reports no changes at this time.    Allergies   Allergen Reactions   ??? Nitrofuran Analogues Rash     jittery   ??? Nitrofuran Derivative Rash     jittery   ??? Strawberry Swelling     Facial swelling   ??? Zantac [Ranitidine Hcl]       Causes jitters   ??? Adhesive Tape-Silicones Rash   ??? Latex, Natural Rubber Rash       Changes to allergies: No    SPECIALTY MEDICATION ADHERENCE     Enbrel 50mg /ml: 7 days of medicine on hand     Medication Adherence    Patient reported X missed doses in the last month: 0  Specialty Medication: Enbrel 50mg /ml          Specialty medication(s) dose(s) confirmed: Regimen is correct and unchanged.     Are there any concerns with adherence? No    Adherence counseling provided? Not needed    CLINICAL MANAGEMENT AND INTERVENTION      Clinical Benefit Assessment:    Do you feel the medicine is effective or helping  your condition? Yes    Clinical Benefit counseling provided? Not needed    Adverse Effects Assessment:    Are you experiencing any side effects? No    Are you experiencing difficulty administering your medicine? No    Quality of Life Assessment:    Rheumatology:   Quality of Life    On a scale of 1 ??? 10 with 1 representing not at all and 10 representing completely ??? how has your rheumatologic condition affected your:  Daily pain level?: decline to answer  Ability to complete your regular daily tasks (prepare meals, get dressed, etc.)?: decline to answer  Ability to participate in social or family activities?: decline to answer         Have you discussed this with your provider? Not needed    Therapy Appropriateness:    Is therapy appropriate? Yes, therapy is appropriate and should be continued    DISEASE/MEDICATION-SPECIFIC INFORMATION      For patients on injectable medications: Patient currently has 1 doses left.  Next injection is scheduled for 05/06/2020.    PATIENT SPECIFIC NEEDS     - Does the patient have any physical, cognitive, or cultural barriers? No    - Is the patient high risk? No     - Does the patient require a Care Management Plan? No     - Does the patient require physician intervention or other additional services (i.e. nutrition, smoking cessation, social work)? No      SHIPPING     Specialty Medication(s) to be Shipped:   Inflammatory Disorders: Enbrel 50mg /ml    Other medication(s) to be shipped: none       Changes to insurance: No    Delivery Scheduled: Yes, Expected medication delivery date: 05/08/2020.     Medication will be delivered via Same Day Courier to the confirmed prescription address in [facility].    The patient will receive a drug information handout for each medication shipped and additional FDA Medication Guides as required.  Verified that patient has previously received a Conservation officer, historic buildings.    All of the patient's questions and concerns have been addressed.    Karene Fry Wil Slape   [facility] Specialty Pharmacist

## 2020-05-08 MED FILL — ENBREL SURECLICK 50 MG/ML (1 ML) SUBCUTANEOUS PEN INJECTOR: SUBCUTANEOUS | 28 days supply | Qty: 4 | Fill #1

## 2020-05-08 MED FILL — ENBREL SURECLICK 50 MG/ML (1 ML) SUBCUTANEOUS PEN INJECTOR: 28 days supply | Qty: 4 | Fill #1 | Status: AC

## 2020-05-17 ENCOUNTER — Other Ambulatory Visit: Payer: Self-pay

## 2020-05-17 ENCOUNTER — Ambulatory Visit: Payer: Self-pay

## 2020-05-17 ENCOUNTER — Emergency Department
Admission: EM | Admit: 2020-05-17 | Discharge: 2020-05-17 | Disposition: A | Discharge status: Home / Self Care | Payer: BC Managed Care – PPO | Attending: Student in an Organized Health Care Education/Training Program | Admitting: Student in an Organized Health Care Education/Training Program

## 2020-05-17 ENCOUNTER — Encounter: Payer: Self-pay | Admitting: Emergency Medicine

## 2020-05-17 ENCOUNTER — Emergency Department: Payer: BC Managed Care – PPO

## 2020-05-17 DIAGNOSIS — E119 Type 2 diabetes mellitus without complications: Secondary | ICD-10-CM | POA: Insufficient documentation

## 2020-05-17 DIAGNOSIS — R42 Dizziness and giddiness: Secondary | ICD-10-CM | POA: Insufficient documentation

## 2020-05-17 DIAGNOSIS — Z79899 Other long term (current) drug therapy: Secondary | ICD-10-CM | POA: Insufficient documentation

## 2020-05-17 DIAGNOSIS — Z91040 Latex allergy status: Secondary | ICD-10-CM | POA: Diagnosis not present

## 2020-05-17 DIAGNOSIS — Z794 Long term (current) use of insulin: Secondary | ICD-10-CM | POA: Insufficient documentation

## 2020-05-17 DIAGNOSIS — E039 Hypothyroidism, unspecified: Secondary | ICD-10-CM | POA: Diagnosis not present

## 2020-05-17 DIAGNOSIS — R519 Headache, unspecified: Secondary | ICD-10-CM | POA: Diagnosis not present

## 2020-05-17 DIAGNOSIS — E860 Dehydration: Secondary | ICD-10-CM | POA: Insufficient documentation

## 2020-05-17 DIAGNOSIS — Z87891 Personal history of nicotine dependence: Secondary | ICD-10-CM | POA: Diagnosis not present

## 2020-05-17 LAB — URINALYSIS, COMPLETE (UACMP) WITH MICROSCOPIC
Bacteria, UA: NONE SEEN
Bilirubin Urine: NEGATIVE
Glucose, UA: >=500 mg/dL — AB
Hgb urine dipstick: NEGATIVE
Ketones, ur: NEGATIVE mg/dL
Leukocytes,Ua: NEGATIVE
Nitrite: NEGATIVE
Protein, ur: NEGATIVE mg/dL
Specific Gravity, Urine: 1.036 — ABNORMAL HIGH (ref 1.005–1.030)
pH: 5 (ref 5.0–8.0)

## 2020-05-17 LAB — BASIC METABOLIC PANEL
Anion gap: 8 (ref 5–15)
BUN: 13 mg/dL (ref 6–20)
CO2: 25 mmol/L (ref 22–32)
Calcium: 8.9 mg/dL (ref 8.9–10.3)
Chloride: 99 mmol/L (ref 98–111)
Creatinine, Ser: 0.52 mg/dL (ref 0.44–1.00)
GFR calc Af Amer: >60 mL/min (ref >60)
GFR calc non Af Amer: >60 mL/min (ref >60)
Glucose, Bld: 162 mg/dL — ABNORMAL HIGH (ref 70–99)
Potassium: 3.9 mmol/L (ref 3.5–5.1)
Sodium: 132 mmol/L — ABNORMAL LOW (ref 135–145)

## 2020-05-17 LAB — CBC
HCT: 41.9 % (ref 36.0–46.0)
Hemoglobin: 14.1 g/dL (ref 12.0–15.0)
MCH: 26.4 pg (ref 26.0–34.0)
MCHC: 33.7 g/dL (ref 30.0–36.0)
MCV: 78.3 fL — ABNORMAL LOW (ref 80.0–100.0)
Platelets: 282 10*3/uL (ref 150–400)
RBC: 5.35 MIL/uL — ABNORMAL HIGH (ref 3.87–5.11)
RDW: 13.1 % (ref 11.5–15.5)
WBC: 11.7 10*3/uL — ABNORMAL HIGH (ref 4.0–10.5)
nRBC: 0 % (ref 0.0–0.2)

## 2020-05-17 LAB — SEDIMENTATION RATE: Sed Rate: 27 mm/hr — ABNORMAL HIGH (ref 0–20)

## 2020-05-17 MED ORDER — PROCHLORPERAZINE MALEATE 10 MG PO TABS
10.0000 mg | ORAL_TABLET | Freq: Four times a day (QID) | ORAL | 0 refills | Status: DC | PRN
Start: 2020-05-17 — End: 2021-01-23

## 2020-05-17 MED ORDER — DEXAMETHASONE SODIUM PHOSPHATE 10 MG/ML IJ SOLN
10.0000 mg | Freq: Once | INTRAMUSCULAR | Status: AC
  Administered 2020-05-17: 10 mg via INTRAVENOUS
  Filled 2020-05-17: qty 1

## 2020-05-17 MED ORDER — SODIUM CHLORIDE 0.9 % IV BOLUS
1000.0000 mL | Freq: Once | INTRAVENOUS | Status: AC
  Administered 2020-05-17: 1000 mL via INTRAVENOUS

## 2020-05-17 MED ORDER — DIPHENHYDRAMINE HCL 50 MG/ML IJ SOLN
12.5000 mg | Freq: Once | INTRAMUSCULAR | Status: AC
  Administered 2020-05-17: 12.5 mg via INTRAVENOUS
  Filled 2020-05-17: qty 1

## 2020-05-17 MED ORDER — PROCHLORPERAZINE MALEATE 10 MG PO TABS
10.0000 mg | ORAL_TABLET | Freq: Four times a day (QID) | ORAL | 0 refills | Status: DC | PRN
Start: 2020-05-17 — End: 2020-05-17

## 2020-05-17 MED ORDER — ACETAMINOPHEN 500 MG PO TABS
1000.0000 mg | ORAL_TABLET | Freq: Once | ORAL | Status: AC
  Administered 2020-05-17: 1000 mg via ORAL
  Filled 2020-05-17: qty 2

## 2020-05-17 MED ORDER — PROCHLORPERAZINE EDISYLATE 10 MG/2ML IJ SOLN
10.0000 mg | Freq: Once | INTRAMUSCULAR | Status: AC
  Administered 2020-05-17: 10 mg via INTRAVENOUS
  Filled 2020-05-17: qty 2

## 2020-05-17 NOTE — ED Notes (Signed)
Pt given 2nd pillow. Bed locked low. Rail up. Call bell within reach. Denies any other needs. Lights dimmed for pt.

## 2020-05-17 NOTE — ED Triage Notes (Signed)
Pt here for left temporal headache for 2 days with dizziness.  No vision changes. OTC meds have not helped. No history headaches.  Describes dizzy feeling as lightheaded.  VSS at this time.

## 2020-05-17 NOTE — ED Notes (Signed)
Pt calling sister who is ride home to notify she is leaving in approximately . Fluids need another to finish running. Pt given another warm blanket.

## 2020-05-17 NOTE — ED Notes (Signed)
Pt denies numbness, tingling, weakness, sensitivity to light/sound, nausea. Report L-sided temporal HA for last few days with dizziness. Steady in room from bed to toilet. A&Ox4.

## 2020-05-17 NOTE — ED Provider Notes (Addendum)
[facility] Emergency Department Provider Note    First MD Initiated Contact with Patient 05/17/20 1614     (approximate)  I have reviewed the triage vital signs and the nursing notes.   HISTORY  Chief Complaint Dizziness and Headache    HPI Ashley Cochran is a 44 y.o. female presents to the ER for evaluation of lightheadedness and dizziness for 3 days and then developed of a left-sided headache last night.  Is not the worst headache of her life.  No fevers.  No neck pain or stiffness.  Denies any chest pain or shortness of breath.  Has remote history of migraine headaches this does feel different.  Does also have a history of vertigo she states that this feels different.  Has had decreased p.o. intake because she states that she just has not felt well but denies any abdominal pain.  No vomiting.    Past Medical History:  Diagnosis Date  . Arthritis    Rheumatoid Arthritis  . Asthma   . Diabetes mellitus without complication ([place])   . GERD (gastroesophageal reflux disease)   . Kidney stones   . Nephrolithiasis 08/19/2016  . Thyroid disease    Family History  Problem Relation Age of Onset  . Hypertension Mother   . Diabetes Mother        type 2  . Asthma Mother   . Stroke Mother        Mini strokes  . GER disease Mother   . Hypertension Father   . Diabetes Father        Type 2  . Asthma Father   . GER disease Father   . Asthma Sister   . Allergies Sister   . Asthma Brother   . Allergies Brother    History reviewed. No pertinent surgical history. Patient Active Problem List   Diagnosis Date Noted  . Class 3 severe obesity due to excess calories with serious comorbidity and body mass index (BMI) of 40.0 to 44.9 in adult ([place]) 01/26/2019  . Uncontrolled type 2 diabetes mellitus with hyperglycemia ([place]) 01/26/2019  . Dyslipidemia associated with type 2 diabetes mellitus ([place]) 01/26/2019  . Hypothyroidism 04/09/2015  . Rheumatoid arthritis  ([place]) 04/09/2015      Prior to Admission medications   Medication Sig Start Date End Date Taking? Authorizing Provider  albuterol (PROVENTIL HFA;VENTOLIN HFA) 108 (90 Base) MCG/ACT inhaler Inhale 2 puffs into the lungs every 6 (six) hours as needed for wheezing or shortness of breath. 12/08/16   Barnet Pall, MD  albuterol (PROVENTIL) (2.5 MG/3ML) 0.083% nebulizer solution Take 3 mLs (2.5 mg total) by nebulization every 6 (six) hours as needed for wheezing or shortness of breath. 10/23/19   Hubbard Hartshorn, FNP  budesonide-formoterol (SYMBICORT) 80-4.5 MCG/ACT inhaler Inhale 2 puffs into the lungs 2 (two) times daily. 10/23/19   Hubbard Hartshorn, FNP  empagliflozin (JARDIANCE) 25 MG TABS tablet Take 25 mg by mouth daily. 01/03/20   Hubbard Hartshorn, FNP  Etanercept 50 MG/ML SOCT Inject 1 Dose into the skin once a week.    [provider]  fluticasone (FLONASE) 50 MCG/ACT nasal spray Place 2 sprays into both nostrils daily. 10/23/19   Hubbard Hartshorn, FNP  Insulin Degludec (TRESIBA) 100 UNIT/ML SOLN Inject 10 Units into the skin daily. 06/22/19   Fredderick Severance, NP  levothyroxine (SYNTHROID) 175 MCG tablet TAKE ONE TABLET BY MOUTH EVERY DAY 01/03/20   Hubbard Hartshorn, FNP  liraglutide Donna Bernard)  18 MG/3ML SOPN Inject 0.3 mLs (1.8 mg total) into the skin daily. 12/08/18   Doles-Johnson, Teah, NP  montelukast (SINGULAIR) 10 MG tablet TAKE ONE TABLET BY MOUTH ONCE DAILY FOR ASTHMA 04/02/20   Steele Sizer, MD  naproxen (NAPROSYN) 500 MG tablet Take 0.5 tablets (250 mg total) by mouth 3 (three) times daily as needed. Patient taking differently: Take 500 mg by mouth 2 (two) times daily with a meal.  12/08/18   Doles-Johnson, Teah, NP  omeprazole (PRILOSEC) 20 MG capsule Take 1 capsule (20 mg total) by mouth daily. 01/03/20   Hubbard Hartshorn, FNP  ondansetron (ZOFRAN) 4 MG tablet Take 1 tablet (4 mg total) by mouth 2 (two) times daily. 12/08/16   Barnet Pall, MD  OVER THE COUNTER MEDICATION Apply 1  application topically daily. Triderma MD    [provider]  prochlorperazine (COMPAZINE) 10 MG tablet Take 1 tablet (10 mg total) by mouth every 6 (six) hours as needed for nausea or vomiting. 05/17/20   Merlyn Lot, MD  promethazine (PHENERGAN) 12.5 MG tablet Take 1 tablet (12.5 mg total) by mouth every 8 (eight) hours as needed for nausea or vomiting. *DO NOT TAKE WITH Promethazine-DM cough syrup* 11/21/19   Hubbard Hartshorn, FNP  tiZANidine (ZANAFLEX) 4 MG tablet Take 1 tablet (4 mg total) by mouth every 8 (eight) hours as needed for muscle spasms. 06/20/19   Poulose, Bethel Born, NP  traZODone (DESYREL) 50 MG tablet Take 1 tablet (50 mg total) by mouth at bedtime. 12/08/16   Barnet Pall, MD    Allergies Nitrofuran derivatives, Latex, Strawberry flavor, and Tape    Social History Social History   Tobacco Use  . Smoking status: Former Smoker    Quit date: 09/20/2004    Years since quitting: 15.6  . Smokeless tobacco: Never Used  Substance Use Topics  . Alcohol use: No  . Drug use: No    Review of Systems Patient denies headaches, rhinorrhea, blurry vision, numbness, shortness of breath, chest pain, edema, cough, abdominal pain, nausea, vomiting, diarrhea, dysuria, fevers, rashes or hallucinations unless otherwise stated above in HPI. ____________________________________________   PHYSICAL EXAM:  VITAL SIGNS: Vitals:   05/17/20 1745 05/17/20 1800  BP:  136/82  Pulse: 78 69  Resp:    Temp:    SpO2: 99% 97%    Constitutional: Alert and oriented.  Eyes: Conjunctivae are normal.  Head: Atraumatic. No lesions or erythema Nose: No congestion/rhinnorhea. Mouth/Throat: Mucous membranes are moist.   Neck: No stridor. Painless ROM.  Cardiovascular: Normal rate, regular rhythm. Grossly normal heart sounds.  Good peripheral circulation. Respiratory: Normal respiratory effort.  No retractions. Lungs CTAB. Gastrointestinal: Soft and nontender. No distention. No  abdominal bruits. No CVA tenderness. Genitourinary:  Musculoskeletal: No lower extremity tenderness nor edema.  No joint effusions. Neurologic: CN- intact.  No facial droop, Normal FNF.  Normal heel to shin.  Sensation intact bilaterally. Normal speech and language. No gross focal neurologic deficits are appreciated. No gait instability. Skin:  Skin is warm, dry and intact. No rash noted. Psychiatric: Mood and affect are normal. Speech and behavior are normal.  ____________________________________________   LABS (all labs ordered are listed, but only abnormal results are displayed)  Results for orders placed or performed during the hospital encounter of 05/17/20 (from the past 24 hour(s))  Urinalysis, Complete w Microscopic     Status: Abnormal   Collection Time: 05/17/20 12:22 PM  Result Value Ref Range   Color, Urine AMBER (  A) YELLOW   APPearance HAZY (A) CLEAR   Specific Gravity, Urine 1.036 (H) 1.005 - 1.030   pH 5.0 5.0 - 8.0   Glucose, UA >=500 (A) NEGATIVE mg/dL   Hgb urine dipstick NEGATIVE NEGATIVE   Bilirubin Urine NEGATIVE NEGATIVE   Ketones, ur NEGATIVE NEGATIVE mg/dL   Protein, ur NEGATIVE NEGATIVE mg/dL   Nitrite NEGATIVE NEGATIVE   Leukocytes,Ua NEGATIVE NEGATIVE   RBC / HPF 0-5 0 - 5 RBC/hpf   WBC, UA 0-5 0 - 5 WBC/hpf   Bacteria, UA NONE SEEN NONE SEEN   Squamous Epithelial / LPF 0-5 0 - 5   Mucus PRESENT    Budding Yeast PRESENT   Basic metabolic panel     Status: Abnormal   Collection Time: 05/17/20 12:35 PM  Result Value Ref Range   Sodium 132 (L) 135 - 145 mmol/L   Potassium 3.9 3.5 - 5.1 mmol/L   Chloride 99 98 - 111 mmol/L   CO2 25 22 - 32 mmol/L   Glucose, Bld 162 (H) 70 - 99 mg/dL   BUN 13 6 - 20 mg/dL   Creatinine, Ser 0.52 0.44 - 1.00 mg/dL   Calcium 8.9 8.9 - 10.3 mg/dL   GFR calc non Af Amer >60 >60 mL/min   GFR calc Af Amer >60 >60 mL/min   Anion gap 8 5 - 15  CBC     Status: Abnormal   Collection Time: 05/17/20 12:35 PM  Result Value Ref  Range   WBC 11.7 (H) 4.0 - 10.5 K/uL   RBC 5.35 (H) 3.87 - 5.11 MIL/uL   Hemoglobin 14.1 12.0 - 15.0 g/dL   HCT 41.9 36.0 - 46.0 %   MCV 78.3 (L) 80.0 - 100.0 fL   MCH 26.4 26.0 - 34.0 pg   MCHC 33.7 30.0 - 36.0 g/dL   RDW 13.1 11.5 - 15.5 %   Platelets 282 150 - 400 K/uL   nRBC 0.0 0.0 - 0.2 %  Sedimentation rate     Status: Abnormal   Collection Time: 05/17/20 12:35 PM  Result Value Ref Range   Sed Rate 27 (H) 0 - 20 mm/hr   ____________________________________________  EKG My review and personal interpretation at Time: 12:27   Indication: lightheadedness  Rate: 75  Rhythm: sinus Axis: normal Other: normal intervals, no stemi ____________________________________________  RADIOLOGY  I personally reviewed all radiographic images ordered to evaluate for the above acute complaints and reviewed radiology reports and findings.  These findings were personally discussed with the patient.  Please see medical record for radiology report.  ____________________________________________   PROCEDURES  Procedure(s) performed:  Procedures    Critical Care performed: no ____________________________________________   INITIAL IMPRESSION / ASSESSMENT AND PLAN / ED COURSE  Pertinent labs & imaging results that were available during my care of the patient were reviewed by me and considered in my medical decision making (see chart for details).   DDX: Tension, migraine, cluster, sinusitis, GCA, mass, SAH, ICH  Makinzie Considine is a 44 y.o. who presents to the ED with symptoms as described above.  Patient clinically very well-appearing nontoxic.  Blood work with triage does show evidence of hemoconcentration consistent with dehydration with increased urine spec gravity.  As she has a reassuring neuro exam with no focal deficits.  CT imaging was ordered to exclude mass given duration of symptoms.  Does not seem consistent with subarachnoid or infectious process.  Will give migraine cocktail IV  fluids and reassess.  Clinical Course as  of May 17 1812  Fri May 17, 2020  1739 POC urine preg, ED [PR]  [phone] Patient feels well at this point.  Work-up is reassuring.  Does not seem consistent with GCA and esr equivocal. No mass lesion.  Does not seem clinically consistent with subarachnoid or meningitis.  Not having any infectious symptoms.  Symptoms also significant improved with IV hydration which do suspect some component of dehydration contributing to her presentation.  Does appear stable and appropriate for outpatient follow-up.  Have discussed with the patient and available family all diagnostics and treatments performed thus far and all questions were answered to the best of my ability. The patient demonstrates understanding and agreement with plan.    [PR]    Clinical Course User Index [PR] Merlyn Lot, MD    The patient was evaluated in Emergency Department today for the symptoms described in the history of present illness. He/she was evaluated in the context of the global COVID-19 pandemic, which necessitated consideration that the patient might be at risk for infection with the SARS-CoV-2 virus that causes COVID-19. Institutional protocols and algorithms that pertain to the evaluation of patients at risk for COVID-19 are in a state of rapid change based on information released by regulatory bodies including the CDC and federal and state organizations. These policies and algorithms were followed during the patient's care in the ED.  As part of my medical decision making, I reviewed the following data within the [place] notes reviewed and incorporated, Labs reviewed, notes from prior ED visits and [place] Controlled Substance Database   ____________________________________________   FINAL CLINICAL IMPRESSION(S) / ED DIAGNOSES  Final diagnoses:  Lightheadedness  Bad headache  Dehydration      NEW MEDICATIONS STARTED DURING THIS VISIT:  New  Prescriptions   PROCHLORPERAZINE (COMPAZINE) 10 MG TABLET    Take 1 tablet (10 mg total) by mouth every 6 (six) hours as needed for nausea or vomiting.     Note:  This document was prepared using Dragon voice recognition software and may include unintentional dictation errors.      Merlyn Lot, MD 05/17/20 [phone]

## 2020-05-17 NOTE — Telephone Encounter (Signed)
Pt. Reports she started feeling dizzy Tuesday and having some vertigo. Some nausea associated with it as well. Has a mild headache this morning. No availability in the practice today. Instructed pt. To go to UC. Verbalizes understanding.  Reason for Disposition . [1] MODERATE dizziness (e.g., interferes with normal activities) AND [2] has NOT been evaluated by physician for this  (Exception: dizziness caused by heat exposure, sudden standing, or poor fluid intake)  Answer Assessment - Initial Assessment Questions 1. DESCRIPTION: "Describe your dizziness."     Dizzy 2. LIGHTHEADED: "Do you feel lightheaded?" (e.g., somewhat faint, woozy, weak upon standing)     Yes - standing 3. VERTIGO: "Do you feel like either you or the room is spinning or tilting?" (i.e. vertigo)     Sometimes 4. SEVERITY: "How bad is it?"  "Do you feel like you are going to faint?" "Can you stand and walk?"   - MILD - walking normally   - MODERATE - interferes with normal activities (e.g., work, school)    - SEVERE - unable to stand, requires support to walk, feels like passing out now.      Moderate 5. ONSET:  "When did the dizziness begin?"     Tuesday 6. AGGRAVATING FACTORS: "Does anything make it worse?" (e.g., standing, change in head position)     Changing position 7. HEART RATE: "Can you tell me your heart rate?" "How many beats in 15 seconds?"  (Note: not all patients can do this)       No 8. CAUSE: "What do you think is causing the dizziness?"     Unsure 9. RECURRENT SYMPTOM: "Have you had dizziness before?" If so, ask: "When was the last time?" "What happened that time?"     No 10. OTHER SYMPTOMS: "Do you have any other symptoms?" (e.g., fever, chest pain, vomiting, diarrhea, bleeding)       Headache 11. PREGNANCY: "Is there any chance you are pregnant?" "When was your last menstrual period?"       No  Protocols used: DIZZINESS [facility]

## 2020-05-17 NOTE — Discharge Instructions (Addendum)

## 2020-05-17 NOTE — ED Notes (Signed)
Pt leaving for CT.  

## 2020-05-21 NOTE — Telephone Encounter (Signed)
FYI

## 2020-06-04 NOTE — Unmapped (Signed)
[facility] Refill Coordination Note    Specialty Medication(s) to be Shipped:   Inflammatory Disorders: Enbrel    Other medication(s) to be shipped: n/a     Yvonne Gray, DOB: 05/24/1976  Phone: [phone] (home)       All above HIPAA information was verified with patient.     Was a Nurse, learning disability used for this call? No    Completed refill call assessment today to schedule patient's medication shipment from the [facility] [phone]).       Specialty medication(s) and dose(s) confirmed: Regimen is correct and unchanged.   Changes to medications: Yvonne Gray reports no changes at this time.  Changes to insurance: No  Questions for the pharmacist: No    Confirmed patient received Welcome Packet with first shipment. The patient will receive a drug information handout for each medication shipped and additional FDA Medication Guides as required.       DISEASE/MEDICATION-SPECIFIC INFORMATION        For patients on injectable medications: Patient currently has 1 doses left.  Next injection is scheduled for n/a.    SPECIALTY MEDICATION ADHERENCE     Medication Adherence    Patient reported X missed doses in the last month: 0  Specialty Medication: Enbrel  Patient is on additional specialty medications: No  Patient is on more than two specialty medications: No  Any gaps in refill history greater than 2 weeks in the last 3 months: no  Demonstrates understanding of importance of adherence: yes  Informant: patient                Enbrel 50mg /ml: Patient has 7 days of medication on hand      SHIPPING     Shipping address confirmed in Epic.     Delivery Scheduled: Yes, Expected medication delivery date: 6/17.     Medication will be delivered via Same Day Courier to the prescription address in Epic [facility].    Yvonne Gray   [facility] Specialty Technician

## 2020-06-06 MED FILL — ENBREL SURECLICK 50 MG/ML (1 ML) SUBCUTANEOUS PEN INJECTOR: SUBCUTANEOUS | 28 days supply | Qty: 4 | Fill #2

## 2020-06-06 MED FILL — ENBREL SURECLICK 50 MG/ML (1 ML) SUBCUTANEOUS PEN INJECTOR: 28 days supply | Qty: 4 | Fill #2 | Status: AC

## 2020-06-21 ENCOUNTER — Ambulatory Visit (INDEPENDENT_AMBULATORY_CARE_PROVIDER_SITE_OTHER): Payer: BC Managed Care – PPO | Admitting: Family Medicine

## 2020-06-21 ENCOUNTER — Ambulatory Visit: Payer: Self-pay

## 2020-06-21 ENCOUNTER — Encounter: Payer: Self-pay | Admitting: Family Medicine

## 2020-06-21 VITALS — Ht 67.0 in | Wt 250.0 lb

## 2020-06-21 DIAGNOSIS — J329 Chronic sinusitis, unspecified: Secondary | ICD-10-CM

## 2020-06-21 DIAGNOSIS — J45901 Unspecified asthma with (acute) exacerbation: Secondary | ICD-10-CM

## 2020-06-21 DIAGNOSIS — J069 Acute upper respiratory infection, unspecified: Secondary | ICD-10-CM

## 2020-06-21 DIAGNOSIS — J310 Chronic rhinitis: Secondary | ICD-10-CM

## 2020-06-21 DIAGNOSIS — E039 Hypothyroidism, unspecified: Secondary | ICD-10-CM

## 2020-06-21 DIAGNOSIS — Z8719 Personal history of other diseases of the digestive system: Secondary | ICD-10-CM | POA: Diagnosis not present

## 2020-06-21 DIAGNOSIS — E1165 Type 2 diabetes mellitus with hyperglycemia: Secondary | ICD-10-CM

## 2020-06-21 DIAGNOSIS — J209 Acute bronchitis, unspecified: Secondary | ICD-10-CM | POA: Diagnosis not present

## 2020-06-21 MED ORDER — IPRATROPIUM-ALBUTEROL 0.5-2.5 (3) MG/3ML IN SOLN: 3.0000 mL | Freq: Four times a day (QID) | RESPIRATORY_TRACT | 1 refills | Status: DC | PRN

## 2020-06-21 MED ORDER — AZITHROMYCIN 250 MG PO TABS: 250.0000 mg | ORAL_TABLET | Freq: Every day | ORAL | 0 refills | Status: DC

## 2020-06-21 MED ORDER — LEVOTHYROXINE SODIUM 175 MCG PO TABS: ORAL_TABLET | ORAL | 0 refills | Status: DC

## 2020-06-21 MED ORDER — OMEPRAZOLE 20 MG PO CPDR: 20.0000 mg | DELAYED_RELEASE_CAPSULE | Freq: Every day | ORAL | 1 refills | Status: DC

## 2020-06-21 MED ORDER — MONTELUKAST SODIUM 10 MG PO TABS: ORAL_TABLET | ORAL | 3 refills | Status: DC

## 2020-06-21 MED ORDER — FLUTICASONE PROPIONATE 50 MCG/ACT NA SUSP: 2.0000 | Freq: Every day | NASAL | 6 refills | Status: AC

## 2020-06-21 MED ORDER — PREDNISONE 20 MG PO TABS: 40.0000 mg | ORAL_TABLET | Freq: Every day | ORAL | 0 refills | Status: AC

## 2020-06-21 NOTE — Progress Notes (Signed)
Name: Ashley Cochran   MRN: [id number]    DOB: [birth year]   Date:06/21/2020       Progress Note  Subjective:    Chief Complaint  Chief Complaint  Patient presents with  . URI    onset 3 days, symptoms include: cough, sob, sinus pressure, and sore throat  . Asthma  . Medication Refill    I connected with  [name]  on 06/21/20 at 10:20 AM EDT by a video enabled telemedicine application and verified that I am speaking with the correct person using two identifiers.  I discussed the limitations of evaluation and management by telemedicine and the availability of in person appointments. The patient expressed understanding and agreed to proceed. Staff also discussed with the patient that there may be a patient responsible charge related to this service. Patient Location: home Provider Location: [facility] Additional Individuals present: none  3 days ago she woke up with sore scratchy throat and it has gotten worse with URI sx and worsening breathing.  URI  This is a new problem. The current episode started in the past 7 days. The problem has been gradually worsening. There has been no fever. Associated symptoms include congestion, coughing, headaches (chronic headaches), a plugged ear sensation, rhinorrhea, a sore throat, swollen glands and wheezing. Pertinent negatives include no abdominal pain, chest pain, diarrhea, dysuria, ear pain, joint pain, joint swelling, nausea, neck pain, rash, sinus pain, sneezing or vomiting. Treatments tried: promethazine cough syrup helped a little. The treatment provided no relief.  Asthma She complains of chest tightness, cough, difficulty breathing, frequent throat clearing, hoarse voice, shortness of breath, sputum production (very very little yellow) and wheezing. There is no hemoptysis. This is a recurrent problem. The current episode started in the past 7 days. Associated symptoms include headaches (chronic headaches), nasal congestion, postnasal  drip, rhinorrhea and a sore throat. Pertinent negatives include no appetite change, chest pain, dyspnea on exertion, ear congestion, ear pain, fever, heartburn, malaise/fatigue, myalgias, orthopnea, PND, sneezing, sweats, trouble swallowing or weight loss. Relieved by: out of meds. Her past medical history is significant for asthma.  Medication Refill Associated symptoms include congestion, coughing, headaches (chronic headaches), a sore throat and swollen glands. Pertinent negatives include no abdominal pain, chest pain, chills, diaphoresis, fatigue, fever, myalgias, nausea, neck pain, rash, vomiting or weakness.  Need refill on some home meds, last OV with PCP was Dec, need f/up appt   Pt with IDDM, not checking sugars, she things her sugars are probably high cause of urinary frequency    Patient Active Problem List   Diagnosis Date Noted  . Asthma with acute exacerbation 06/21/2020  . H/O gastroesophageal reflux (GERD) 06/21/2020  . Class 3 severe obesity due to excess calories with serious comorbidity and body mass index (BMI) of 40.0 to 44.9 in adult (HCC) 01/26/2019  . Uncontrolled type 2 diabetes mellitus with hyperglycemia (HCC) 01/26/2019  . Dyslipidemia associated with type 2 diabetes mellitus (HCC) 01/26/2019  . Hypothyroidism 04/09/2015  . Rheumatoid arthritis (HCC) 04/09/2015    Social History   Tobacco Use  . Smoking status: Former Smoker    Quit date: 09/20/2004    Years since quitting: 15.7  . Smokeless tobacco: Never Used  Substance Use Topics  . Alcohol use: No     Current Outpatient Medications:  .  albuterol (PROVENTIL HFA;VENTOLIN HFA) 108 (90 Base) MCG/ACT inhaler, Inhale 2 puffs into the lungs every 6 (six) hours as needed for wheezing or shortness of breath., Disp: 3  Inhaler, Rfl: 4 .  albuterol (PROVENTIL) (2.5 MG/3ML) 0.083% nebulizer solution, Take 3 mLs (2.5 mg total) by nebulization every 6 (six) hours as needed for wheezing or shortness of breath., Disp:  150 mL, Rfl: 1 .  empagliflozin (JARDIANCE) 25 MG TABS tablet, Take 25 mg by mouth daily., Disp: 90 tablet, Rfl: 1 .  Etanercept 50 MG/ML SOCT, Inject 1 Dose into the skin once a week., Disp: , Rfl:  .  fluticasone (FLONASE) 50 MCG/ACT nasal spray, Place 2 sprays into both nostrils daily., Disp: 16 g, Rfl: 6 .  levothyroxine (SYNTHROID) 175 MCG tablet, TAKE ONE TABLET BY MOUTH EVERY DAY, Disp: 90 tablet, Rfl: 1 .  montelukast (SINGULAIR) 10 MG tablet, TAKE ONE TABLET BY MOUTH ONCE DAILY FOR ASTHMA, Disp: 30 tablet, Rfl: 0 .  naproxen (NAPROSYN) 500 MG tablet, Take 0.5 tablets (250 mg total) by mouth 3 (three) times daily as needed. (Patient taking differently: Take 500 mg by mouth 2 (two) times daily with a meal. ), Disp: 200 tablet, Rfl: 4 .  omeprazole (PRILOSEC) 20 MG capsule, Take 1 capsule (20 mg total) by mouth daily., Disp: 90 capsule, Rfl: 1 .  ondansetron (ZOFRAN) 4 MG tablet, Take 1 tablet (4 mg total) by mouth 2 (two) times daily., Disp: 20 tablet, Rfl: 1 .  OVER THE COUNTER MEDICATION, Apply 1 application topically daily. Triderma MD, Disp: , Rfl:  .  prochlorperazine (COMPAZINE) 10 MG tablet, Take 1 tablet (10 mg total) by mouth every 6 (six) hours as needed for nausea or vomiting., Disp: 12 tablet, Rfl: 0 .  promethazine (PHENERGAN) 12.5 MG tablet, Take 1 tablet (12.5 mg total) by mouth every 8 (eight) hours as needed for nausea or vomiting. *DO NOT TAKE WITH Promethazine-DM cough syrup*, Disp: 10 tablet, Rfl: 0 .  tiZANidine (ZANAFLEX) 4 MG tablet, Take 1 tablet (4 mg total) by mouth every 8 (eight) hours as needed for muscle spasms., Disp: 30 tablet, Rfl: 0 .  traZODone (DESYREL) 50 MG tablet, Take 1 tablet (50 mg total) by mouth at bedtime., Disp: 90 tablet, Rfl: 4 .  budesonide-formoterol (SYMBICORT) 80-4.5 MCG/ACT inhaler, Inhale 2 puffs into the lungs 2 (two) times daily. (Patient not taking: Reported on 06/21/2020), Disp: 1 Inhaler, Rfl: 3 .  Insulin Degludec (TRESIBA) 100 UNIT/ML  SOLN, Inject 10 Units into the skin daily. (Patient not taking: Reported on 06/21/2020), Disp: 10 mL, Rfl: 3 .  liraglutide (VICTOZA) 18 MG/3ML SOPN, Inject 0.3 mLs (1.8 mg total) into the skin daily. (Patient not taking: Reported on 06/21/2020), Disp: 36 mL, Rfl: 3  Allergies  Allergen Reactions  . Nitrofuran Derivatives Rash    jittery  . Latex   . Ranitidine Hcl      Causes jitters  . Strawberry Flavor   . Tape Rash   I personally reviewed active problem list, medication list, allergies, family history, social history, health maintenance, notes from last encounter, lab results, imaging with the patient/caregiver today.   Review of Systems  Constitutional: Negative for activity change, appetite change, chills, diaphoresis, fatigue, fever, malaise/fatigue and weight loss.  HENT: Positive for congestion, hoarse voice, postnasal drip, rhinorrhea, sinus pressure and sore throat. Negative for ear pain, sinus pain, sneezing and trouble swallowing.   Respiratory: Positive for cough, sputum production (very very little yellow), chest tightness, shortness of breath and wheezing. Negative for hemoptysis.   Cardiovascular: Negative.  Negative for chest pain, dyspnea on exertion, palpitations, leg swelling and PND.  Gastrointestinal: Negative.  Negative for abdominal pain,  diarrhea, heartburn, nausea and vomiting.  Endocrine: Negative.   Genitourinary: Negative for dysuria.  Musculoskeletal: Negative.  Negative for joint pain, myalgias and neck pain.  Skin: Negative.  Negative for rash.  Allergic/Immunologic: Negative.   Neurological: Positive for headaches (chronic headaches). Negative for tremors and weakness.  Hematological: Negative.   Psychiatric/Behavioral: Negative.   All other systems reviewed and are negative.   Objective:   Virtual encounter, vitals limited, only able to obtain the following Today's Vitals   06/21/20 0939  Weight: 250 lb (113.4 kg)  Height:  (1.702 m)   Body  mass index is 39.16 kg/m. Nursing Note and Vital Signs reviewed.  Physical Exam Vitals and nursing note reviewed.  Constitutional:      Appearance: She is obese.  Pulmonary:     Effort: No tachypnea or respiratory distress.     Comments: Frequent coughing and throat clearing No audible wheeze or stridor  Neurological:     Mental Status: She is alert.  Psychiatric:        Mood and Affect: Mood normal.        Behavior: Behavior normal.     PE limited by telephone encounter  No results found for this or any previous visit (from the past 72 hour(s)).  Assessment and Plan:   Pt is a 44 y/o female presents with congestion, postnasal drip, cough with chest congestion history of asthma which is currently uncontrolled she is not taking her allergy or asthma medication and is out of nebulizer treatments.  She got ill about a week ago started with sore throat and nasal congestion and has gradually worsened.  She denies any fever, sinus tenderness to palpation, sweats, fatigue or myalgias.  Suspect she has a viral URI with asthma exacerbation/acute bronchitis, tell her to start allergy and asthma medications again, treat her postnasal drip and nasal congestion with over-the-counter medications and treat symptomatically, will do a steroid burst, refill and nebulizers and a Z-Pak for her chest congestion cough and asthma exacerbation/acute bronchitis Patient was encouraged to report on improving or worsening symptoms right away due to being on immunosuppressive medications.  Patient is new to me, has multiple chronic conditions and seems that she is either noncompliant or has run out of medications and it was somewhat lost to follow-up she was encouraged to come in this month for routine follow-up so we can check her diabetes and refill her medications    ICD-10-CM   1. Acute bronchitis with asthma with acute exacerbation  J20.9 montelukast (SINGULAIR) 10 MG tablet   J45.901 ipratropium-albuterol  (DUONEB) 0.5-2.5 (3) MG/3ML SOLN    predniSONE (DELTASONE) 20 MG tablet    azithromycin (ZITHROMAX Z-PAK) 250 MG tablet   tx with mucinex, cough meds, prednisone, nebs and zpak, f/up if not improving, UC if worsening SOB  2. Viral upper respiratory tract infection  J06.9    tx for URI supportive, she is on immunosuppresive med, close f/up and low threshold to give stronger Abx if not improving or if worsening  3. Hypothyroidism, unspecified type  E03.9 levothyroxine (SYNTHROID) 175 MCG tablet   30 d med refill, need in OV, and to est care with pcp gone and overdue for 6 month f/up  4. H/O gastroesophageal reflux (GERD)  Z87.19 omeprazole (PRILOSEC) 20 MG capsule   med refill  5. Rhinosinusitis  J31.0 montelukast (SINGULAIR) 10 MG tablet   J32.9 fluticasone (FLONASE) 50 MCG/ACT nasal spray   encouraged to use steroid nasal sprays, antihistamines  6. Uncontrolled  type 2 diabetes mellitus with hyperglycemia (HCC)  E11.65    not taking all meds prescribed, not checking sugars, reports urinary frequency encouraged to come into the office as soon as possible for follow-up     -Red flags and when to present for emergency care or RTC including fever >101.37F, chest pain, shortness of breath, new/worsening/un-resolving symptoms, reviewed with patient at time of visit. Follow up and care instructions discussed and provided in AVS. - I discussed the assessment and treatment plan with the patient. The patient was provided an opportunity to ask questions and all were answered. The patient agreed with the plan and demonstrated an understanding of the instructions.  I provided 30+ minutes of non-face-to-face time during this encounter.  Danelle Berry, PA-C 06/21/20 10:30 AM

## 2020-06-21 NOTE — Telephone Encounter (Signed)
Pt scheduled for virtual.  ?

## 2020-06-21 NOTE — Telephone Encounter (Signed)
Pt. Reports she is having sinus symptoms, sore throat and mild shortness of breath. No fever. Discharge from sinus is clear. No available appointments per Cassandra in the practice. Instructed pt. To got to UC/ED. States "I can't afford that." Requests "promethazine cough syrup be called to my pharmacy." Please advise pt. Answer Assessment - Initial Assessment Questions 1. RESPIRATORY STATUS: "Describe your breathing?" (e.g., wheezing, shortness of breath, unable to speak, severe coughing)      Shortness of breath 2. ONSET: "When did this breathing problem begin?"      This morning 3. PATTERN "Does the difficult breathing come and go, or has it been constant since it started?"      Comes and goes 4. SEVERITY: "How bad is your breathing?" (e.g., mild, moderate, severe)    - MILD: No SOB at rest, mild SOB with walking, speaks normally in sentences, can lay down, no retractions, pulse < 100.    - MODERATE: SOB at rest, SOB with minimal exertion and prefers to sit, cannot lie down flat, speaks in phrases, mild retractions, audible wheezing, pulse 100-120.    - SEVERE: Very SOB at rest, speaks in single words, struggling to breathe, sitting hunched forward, retractions, pulse > 120      Mild 5. RECURRENT SYMPTOM: "Have you had difficulty breathing before?" If Yes, ask: "When was the last time?" and "What happened that time?"      Yes 6. CARDIAC HISTORY: "Do you have any history of heart disease?" (e.g., heart attack, angina, bypass surgery, angioplasty)      No 7. LUNG HISTORY: "Do you have any history of lung disease?"  (e.g., pulmonary embolus, asthma, emphysema)     Asthma 8. CAUSE: "What do you think is causing the breathing problem?"      Sinus 9. OTHER SYMPTOMS: "Do you have any other symptoms? (e.g., dizziness, runny nose, cough, chest pain, fever)     Sore throat, sinus congestion 10. PREGNANCY: "Is there any chance you are pregnant?" "When was your last menstrual period?"       No 11.  TRAVEL: "Have you traveled out of the country in the last month?" (e.g., travel history, exposures)       No  Protocols used: BREATHING DIFFICULTY-A-AH

## 2020-06-27 NOTE — Unmapped (Signed)
[facility] Refill Coordination Note    Specialty Medication(s) to be Shipped:   Inflammatory Disorders: Enbrel    Other medication(s) to be shipped: none     Yvonne Gray, DOB: Dec 19, 1976  Phone: [phone] (home)       All above HIPAA information was verified with patient.     Was a Nurse, learning disability used for this call? No    Completed refill call assessment today to schedule patient's medication shipment from the [facility] [phone]).       Specialty medication(s) and dose(s) confirmed: Regimen is correct and unchanged.   Changes to medications: Yvonne Gray reports no changes at this time.  Changes to insurance: No  Questions for the pharmacist: No    Confirmed patient received Welcome Packet with first shipment. The patient will receive a drug information handout for each medication shipped and additional FDA Medication Guides as required.       DISEASE/MEDICATION-SPECIFIC INFORMATION        For patients on injectable medications: Patient currently has 2 doses left.  Next injection is scheduled for 06/30/20.    SPECIALTY MEDICATION ADHERENCE     Medication Adherence    Patient reported X missed doses in the last month: 0  Specialty Medication: Enbrel  Patient is on additional specialty medications: No                 Enbrel 50 mg/ml: 10 days of medicine on hand          SHIPPING     Shipping address confirmed in Epic.     Delivery Scheduled: Yes, Expected medication delivery date: 07/03/20.     Medication will be delivered via Same Day Courier to the prescription address in Epic WAM.    Unk Lightning   [facility] Specialty Technician

## 2020-07-03 MED FILL — ENBREL SURECLICK 50 MG/ML (1 ML) SUBCUTANEOUS PEN INJECTOR: 28 days supply | Qty: 4 | Fill #3 | Status: AC

## 2020-07-03 MED FILL — ENBREL SURECLICK 50 MG/ML (1 ML) SUBCUTANEOUS PEN INJECTOR: SUBCUTANEOUS | 28 days supply | Qty: 4 | Fill #3

## 2020-07-08 DIAGNOSIS — M059 Rheumatoid arthritis with rheumatoid factor, unspecified: Principal | ICD-10-CM

## 2020-07-08 MED ORDER — NAPROXEN 500 MG TABLET
ORAL_TABLET | Freq: Two times a day (BID) | ORAL | 1 refills | 30 days | Status: CP | PRN
Start: 2020-07-08 — End: ?

## 2020-07-08 NOTE — Unmapped (Signed)
Addended by: Rachel Bo D on: 07/08/2020 08:23 AM     Modules accepted: Orders

## 2020-07-08 NOTE — Unmapped (Signed)
Naproxen refill  Last ov: 05/11/021  Next ov: 08/27/2020

## 2020-07-08 NOTE — Unmapped (Signed)
Reason for call: PT is needing a refill on medication of Naproxen to please be sent to [facility] on [address] in [place].    Thanks       Last ov: Visit date not found  Next ov: 08/27/2020

## 2020-07-09 ENCOUNTER — Ambulatory Visit: Payer: Self-pay | Admitting: Family Medicine

## 2020-07-24 NOTE — Unmapped (Signed)
[facility] Refill Coordination Note    Specialty Medication(s) to be Shipped:   Inflammatory Disorders: Enbrel    Other medication(s) to be shipped: No additional medications requested for fill at this time     Regine Christian, DOB: 03-Jan-1976  Phone: [phone] (home)       All above HIPAA information was verified with patient.     Was a Nurse, learning disability used for this call? No    Completed refill call assessment today to schedule patient's medication shipment from the [facility] [phone]).       Specialty medication(s) and dose(s) confirmed: Regimen is correct and unchanged.   Changes to medications: Ayline reports no changes at this time.  Changes to insurance: No  Questions for the pharmacist: No    Confirmed patient received Welcome Packet with first shipment. The patient will receive a drug information handout for each medication shipped and additional FDA Medication Guides as required.       DISEASE/MEDICATION-SPECIFIC INFORMATION        For patients on injectable medications: Patient currently has 1 doses left.  Next injection is scheduled for 8/8.    SPECIALTY MEDICATION ADHERENCE     Medication Adherence    Patient reported X missed doses in the last month: 0  Specialty Medication: Enbrel  Patient is on additional specialty medications: No  Patient is on more than two specialty medications: No  Any gaps in refill history greater than 2 weeks in the last 3 months: no  Demonstrates understanding of importance of adherence: yes  Informant: patient              Enbrel 50mg /ml: Patient has 7 days of medication on hand       SHIPPING     Shipping address confirmed in Epic.     Delivery Scheduled: Yes, Expected medication delivery date: 8/12.     Medication will be delivered via Same Day Courier to the prescription address in Epic WAM.    Olga Millers   [facility] Specialty Technician

## 2020-08-02 MED FILL — ENBREL SURECLICK 50 MG/ML (1 ML) SUBCUTANEOUS PEN INJECTOR: 28 days supply | Qty: 4 | Fill #4 | Status: AC

## 2020-08-02 MED FILL — ENBREL SURECLICK 50 MG/ML (1 ML) SUBCUTANEOUS PEN INJECTOR: SUBCUTANEOUS | 28 days supply | Qty: 4 | Fill #4

## 2020-08-16 NOTE — Progress Notes (Signed)
Patient ID: Alia Parsley, female    DOB: 1976-12-14, 44 y.o.   MRN: [id number]  PCP: Danelle Berry, PA-C  Chief Complaint  Patient presents with  . Back Pain    rated at a 4 for pain, started Wednesday of last week, she is improved today    Subjective:   Korrin Waterfield is a 44 y.o. female, presents to clinic with CC of the following:  Chief Complaint  Patient presents with  . Back Pain    rated at a 4 for pain, started Wednesday of last week, she is improved today    HPI:  Patient is a 44 year old female patient of Danelle Berry Last visit with Sheliah Mends was in July 2021 Follows up today with back pain concerns.  Of note, she has a significant rheumatologic history as follows:    Ms. 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 She followed with Dr. Gavin Potters in [place], [place] but established care with [facility] 03/2014 (several no shows occurred until 07/2015). She was previously treated with MTX (both oral and injectable formulations), which was discontinued 2/2 GI upset. She was then placed on Enbrel (~2013) on which she still remains. In 02/2017, plaquenil 200 mg BID added to regimen given ongoing pain and stiffness (agent selection based on patient preference to avoid methotrexate or leflunomide given concern for further immunosuppression). During 07/2018 visit, patient had self-discontinued plaquenil without change in symptoms.   On her last visit with rheumatology in May 2021, the following was noted:   Pt presents via video call for f/u today. She reports she is doing well. Her joint are doing well since restarting Enbrel at last visit. She denies any current joint pain. She does have some intermittent ankle swelling at the end of the day but no pain with this. She endorses AM stiffness which resolves within a few mins.  She is taking her Enbrel weekly and tolerating well. She is still taking Naproxen BID.    Assessment/plan:    1.  Seropositive RA - well controlled - last labs stable 12/28/19 - Continue Enbrel 50 mg qweek - Recommend OTC voltaren gel - Counseled tylenol 1000 mg TID PRN for lower back and shoulder pain; Advised trying to decrease naproxen to once a day, counseled gastric protection  Her active problem list also is positive for diabetes, obesity, hyperlipidemia, asthma, GERD, and hypothyroid.  She noted her back pain started last week, mid week. No prior trauma before this started. She feels the pain across her low back in a belt line type of distribution, and at times radiates up her back, especially if she is bending over picking up something.  Denies any pain that goes down the legs, no numbness or tingling in the legs, no weakness or foot drop.  No saddle anesthesia.  Denies any bowel or bladder incontinence symptoms.  She notes this pain feels different from her rheumatoid arthritis pain, and remains on the Enbrel  injections weekly.  She still sees rheumatology, and has a follow-up appointment with them again in September noted. She has tried some Aspercreme topically, was started on 5 mg of cyclobenzaprine by a triage nurse until seen today, and thinks that may have helped some.  She has only been taking that at bedtime.  Also takes naproxen twice daily, 500 mg in addition to the Enbrel. She denies any recent fevers or chills.  Did have a visit 06/21/2020 for an acute bronchitis concern,  and that did improve.  Denies any abdominal pains, no dysuria. She noted the pain has improved some, but is a 4 out of 10 today, and noted when it first started, it was a 12 out of 10, with difficulty trying to move related to the pain  Patient Active Problem List   Diagnosis Date Noted  . Asthma with acute exacerbation 06/21/2020  . H/O gastroesophageal reflux (GERD) 06/21/2020  . Class 3 severe obesity due to excess calories with serious comorbidity and body mass index (BMI) of 40.0 to 44.9 in adult (HCC) 01/26/2019  .  Uncontrolled type 2 diabetes mellitus with hyperglycemia (HCC) 01/26/2019  . Dyslipidemia associated with type 2 diabetes mellitus (HCC) 01/26/2019  . Hypothyroidism 04/09/2015  . Rheumatoid arthritis (HCC) 04/09/2015      Current Outpatient Medications:  .  albuterol (PROVENTIL HFA;VENTOLIN HFA) 108 (90 Base) MCG/ACT inhaler, Inhale 2 puffs into the lungs every 6 (six) hours as needed for wheezing or shortness of breath., Disp: 3 Inhaler, Rfl: 4 .  albuterol (PROVENTIL) (2.5 MG/3ML) 0.083% nebulizer solution, Take 3 mLs (2.5 mg total) by nebulization every 6 (six) hours as needed for wheezing or shortness of breath., Disp: 150 mL, Rfl: 1 .  azithromycin (ZITHROMAX Z-PAK) 250 MG tablet, Take 1 tablet (250 mg total) by mouth daily. 500mg  PO day 1, then 250mg  PO days 205, Disp: 6 tablet, Rfl: 0 .  budesonide-formoterol (SYMBICORT) 80-4.5 MCG/ACT inhaler, Inhale 2 puffs into the lungs 2 (two) times daily. (Patient not taking: Reported on 06/21/2020), Disp: 1 Inhaler, Rfl: 3 .  cyclobenzaprine (FLEXERIL) 5 MG tablet, Take 5 mg by mouth at bedtime as needed., Disp: , Rfl:  .  empagliflozin (JARDIANCE) 25 MG TABS tablet, Take 25 mg by mouth daily., Disp: 90 tablet, Rfl: 1 .  Etanercept 50 MG/ML SOCT, Inject 1 Dose into the skin once a week., Disp: , Rfl:  .  fluticasone (FLONASE) 50 MCG/ACT nasal spray, Place 2 sprays into both nostrils daily., Disp: 16 g, Rfl: 6 .  Insulin Degludec (TRESIBA) 100 UNIT/ML SOLN, Inject 10 Units into the skin daily. (Patient not taking: Reported on 06/21/2020), Disp: 10 mL, Rfl: 3 .  ipratropium-albuterol (DUONEB) 0.5-2.5 (3) MG/3ML SOLN, Take 3 mLs by nebulization every 6 (six) hours as needed., Disp: 180 mL, Rfl: 1 .  levothyroxine (SYNTHROID) 175 MCG tablet, TAKE ONE TABLET BY MOUTH EVERY DAY, Disp: 30 tablet, Rfl: 0 .  liraglutide (VICTOZA) 18 MG/3ML SOPN, Inject 0.3 mLs (1.8 mg total) into the skin daily. (Patient not taking: Reported on 06/21/2020), Disp: 36 mL, Rfl: 3 .   montelukast (SINGULAIR) 10 MG tablet, TAKE ONE TABLET BY MOUTH ONCE DAILY FOR ASTHMA, Disp: 90 tablet, Rfl: 3 .  naproxen (NAPROSYN) 500 MG tablet, Take 0.5 tablets (250 mg total) by mouth 3 (three) times daily as needed. (Patient taking differently: Take 500 mg by mouth 2 (two) times daily with a meal. ), Disp: 200 tablet, Rfl: 4 .  omeprazole (PRILOSEC) 20 MG capsule, Take 1 capsule (20 mg total) by mouth daily., Disp: 90 capsule, Rfl: 1 .  ondansetron (ZOFRAN) 4 MG tablet, Take 1 tablet (4 mg total) by mouth 2 (two) times daily., Disp: 20 tablet, Rfl: 1 .  OVER THE COUNTER MEDICATION, Apply 1 application topically daily. Triderma MD, Disp: , Rfl:  .  prochlorperazine (COMPAZINE) 10 MG tablet, Take 1 tablet (10 mg total) by mouth every 6 (six) hours as needed for nausea or vomiting., Disp: 12 tablet, Rfl: 0 .  promethazine (PHENERGAN) 12.5 MG tablet, Take 1 tablet (12.5 mg total) by mouth every 8 (eight) hours as needed for nausea or vomiting. *DO NOT TAKE WITH Promethazine-DM cough syrup*, Disp: 10 tablet, Rfl: 0 .  tiZANidine (ZANAFLEX) 4 MG tablet, Take 1 tablet (4 mg total) by mouth every 8 (eight) hours as needed for muscle spasms., Disp: 30 tablet, Rfl: 0 .  traZODone (DESYREL) 50 MG tablet, Take 1 tablet (50 mg total) by mouth at bedtime., Disp: 90 tablet, Rfl: 4   Allergies  Allergen Reactions  . Nitrofuran Derivatives Rash    jittery  . Latex   . Ranitidine Hcl      Causes jitters  . Strawberry Flavor   . Tape Rash     No past surgical history on file.   Family History  Problem Relation Age of Onset  . Hypertension Mother   . Diabetes Mother        type 2  . Asthma Mother   . Stroke Mother        Mini strokes  . GER disease Mother   . Hypertension Father   . Diabetes Father        Type 2  . Asthma Father   . GER disease Father   . Asthma Sister   . Allergies Sister   . Asthma Brother   . Allergies Brother      Social History   Tobacco Use  . Smoking  status: Former Smoker    Quit date: 09/20/2004    Years since quitting: 15.9  . Smokeless tobacco: Never Used  Substance Use Topics  . Alcohol use: No    With staff assistance, above reviewed with the patient today.  ROS: As per HPI, otherwise no specific complaints on a limited and focused system review   No results found for this or any previous visit (from the past 72 hour(s)).   PHQ2/9: Depression screen [facility] 2/9 08/19/2020 06/21/2020 01/03/2020 12/28/2019 11/21/2019  Decreased Interest 0 0 0 0 0  Down, Depressed, Hopeless 0 0 0 0 0  PHQ - 2 Score 0 0 0 0 0  Altered sleeping - 0 0 3 1  Tired, decreased energy - 0 0 0 1  Change in appetite - 0 0 0 0  Feeling bad or failure about yourself  - 0 0 0 0  Trouble concentrating - 0 0 0 0  Moving slowly or fidgety/restless - 0 0 0 0  Suicidal thoughts - 0 0 0 0  PHQ-9 Score - 0 0 3 2  Difficult doing work/chores - Not difficult at all Not difficult at all Not difficult at all Not difficult at all  Some recent data might be hidden   PHQ-2/9 Result is neg  Fall Risk: Fall Risk  08/19/2020 06/21/2020 01/03/2020 12/28/2019 11/21/2019  Falls in the past year? 0 0 0 0 0  Number falls in past yr: 0 0 0 0 0  Injury with Fall? - 0 0 0 0  Follow up Falls evaluation completed - Falls evaluation completed Falls evaluation completed Falls evaluation completed      Objective:   Vitals:   08/19/20 0849  BP: 118/76  Pulse: 97  Resp: 16  Temp: 98 F (36.7 C)  TempSrc: Oral  SpO2: 99%  Weight: 249 lb 9.6 oz (113.2 kg)  Height: 5\' 7"  (1.702 m)    Body mass index is 39.09 kg/m.  Physical Exam   NAD, masked, pleasant HEENT - [place]/AT, sclera anicteric, PERRL,conj -  non-inj'ed, pharynx clear Neck - supple, no adenopathy, no rigidity  Car - RRR without m/g/r, not tachycardic Pulm- RR and effort normal at rest, CTA without wheeze or rales Abd - soft, obese, NT diffusely, ND, BS+,  no masses Back - no focal CVA tenderness, minimally tender diffusely  over the spine in the lower lumbar sacral region radiating more towards the left than right side.  Slightly tender at the SI joints bilaterally, more so on the left, denies marked tenderness to palpation in the lumbar muscle regions bilaterally, although does get discomfort in these areas. Straight leg raise was negative with no pains down the back of the legs, although was bothersome to her low back with both right and left straight leg elevation.  Denied marked discomfort with pain her knee to her chest while supine bilaterally. Skin- no rash noted on exposed areas Ext -strength was adequate bilaterally in the lower extremities, including with dorsi and plantar flexion of the feet.   Neuro/psychiatric - affect was not flat, appropriate with conversation  Alert   Grossly non-focal -strength adequate in the lower extremities on testing, sensation intact to LT in distal extremities  Speech normal   Results for orders placed or performed during the hospital encounter of 05/17/20  Basic metabolic panel  Result Value Ref Range   Sodium 132 (L) 135 - 145 mmol/L   Potassium 3.9 3.5 - 5.1 mmol/L   Chloride 99 98 - 111 mmol/L   CO2 25 22 - 32 mmol/L   Glucose, Bld 162 (H) 70 - 99 mg/dL   BUN 13 6 - 20 mg/dL   Creatinine, Ser 1.61 0.44 - 1.00 mg/dL   Calcium 8.9 8.9 - 09.6 mg/dL   GFR calc non Af Amer >60 >60 mL/min   GFR calc Af Amer >60 >60 mL/min   Anion gap 8 5 - 15  CBC  Result Value Ref Range   WBC 11.7 (H) 4.0 - 10.5 K/uL   RBC 5.35 (H) 3.87 - 5.11 MIL/uL   Hemoglobin 14.1 12.0 - 15.0 g/dL   HCT 04.5 36 - 46 %   MCV 78.3 (L) 80.0 - 100.0 fL   MCH 26.4 26.0 - 34.0 pg   MCHC 33.7 30.0 - 36.0 g/dL   RDW 40.9 81.1 - 91.4 %   Platelets 282 150 - 400 K/uL   nRBC 0.0 0.0 - 0.2 %  Urinalysis, Complete w Microscopic  Result Value Ref Range   Color, Urine AMBER (A) YELLOW   APPearance HAZY (A) CLEAR   Specific Gravity, Urine 1.036 (H) 1.005 - 1.030   pH 5.0 5.0 - 8.0   Glucose, UA >=500  (A) NEGATIVE mg/dL   Hgb urine dipstick NEGATIVE NEGATIVE   Bilirubin Urine NEGATIVE NEGATIVE   Ketones, ur NEGATIVE NEGATIVE mg/dL   Protein, ur NEGATIVE NEGATIVE mg/dL   Nitrite NEGATIVE NEGATIVE   Leukocytes,Ua NEGATIVE NEGATIVE   RBC / HPF 0-5 0 - 5 RBC/hpf   WBC, UA 0-5 0 - 5 WBC/hpf   Bacteria, UA NONE SEEN NONE SEEN   Squamous Epithelial / LPF 0-5 0 - 5   Mucus PRESENT    Budding Yeast PRESENT   Sedimentation rate  Result Value Ref Range   Sed Rate 27 (H) 0 - 20 mm/hr       Assessment & Plan:   1. Rheumatoid arthritis with positive rheumatoid factor, involving unspecified site (HCC) 2. Acute bilateral low back pain without sciatica Discussed has a likely mechanical component with underlying  rheumatologic arthritic issues present.  Encouraged that has improved significantly since started midweek last week. Discussed a very low yield to get x-rays, especially noting her diabetic history in combination. Agreed to hold off on those today, and continue with the below recommendations. Emphasized the importance of contrast therapies, with warmth, followed by range of motion exercises, followed by cold recommended and reviewed that with her today. Continue the naproxen product with food, and also the Enbrel injections. We will continue a Flexeril type product, with a 10 mg dose recommended mainly at bedtime, as may make drowsy during the day.  Not to drive after taking as well emphasized. Activity modifications in the short-term, and she states she does have others at work that can help in the short-term with any bending and lifting, and not return to that until symptoms much improved. We will follow-up if not improved or worsening again over the next week, and likely will need imaging, and await that reassessment.  Also may need some lab tests as we discussed today. Also keep her planned follow-up with rheumatology, and may need their input sooner pending her status. - cyclobenzaprine  (FLEXERIL) 10 MG tablet; Take 1 tablet (10 mg total) by mouth 3 (three) times daily as needed for muscle spasms. Take mainly at bedtime as may make drowsy, not to drive after taking.  Dispense: 21 tablet; Refill: 1   As above, will follow-up if not improving or more problematic.     Jamelle Haring, MD 08/19/20 8:50 AM

## 2020-08-19 ENCOUNTER — Other Ambulatory Visit: Payer: Self-pay

## 2020-08-19 ENCOUNTER — Ambulatory Visit: Payer: BC Managed Care – PPO | Admitting: Internal Medicine

## 2020-08-19 ENCOUNTER — Encounter: Payer: Self-pay | Admitting: Internal Medicine

## 2020-08-19 VITALS — BP 118/76 | HR 97 | Temp 98.0°F | Resp 16 | Ht 67.0 in | Wt 249.6 lb

## 2020-08-19 DIAGNOSIS — M059 Rheumatoid arthritis with rheumatoid factor, unspecified: Secondary | ICD-10-CM | POA: Diagnosis not present

## 2020-08-19 DIAGNOSIS — M545 Low back pain, unspecified: Secondary | ICD-10-CM

## 2020-08-19 MED ORDER — CYCLOBENZAPRINE HCL 10 MG PO TABS: 10.0000 mg | ORAL_TABLET | Freq: Three times a day (TID) | ORAL | 1 refills | Status: DC | PRN

## 2020-08-19 NOTE — Patient Instructions (Signed)
Continue the naproxen product, and can take the cyclobenzaprine as prescribed to help mainly at bedtime as may make drowsy.  Also recommend contrast therapies with warmth, followed by range of motion activities, followed by cold  Modify activities in the short-term with trying to avoid bending and lifting activities until symptoms much improved

## 2020-08-23 NOTE — Unmapped (Signed)
[facility] Refill Coordination Note    Specialty Medication(s) to be Shipped:   Inflammatory Disorders: Enbrel    Other medication(s) to be shipped: No additional medications requested for fill at this time     Yvonne Gray, DOB: [birth year]  Phone: [phone] (home)       All above HIPAA information was verified with patient.     Was a Nurse, learning disability used for this call? No    Completed refill call assessment today to schedule patient's medication shipment from the [facility] [phone]).       Specialty medication(s) and dose(s) confirmed: Regimen is correct and unchanged.   Changes to medications: [place] reports no changes at this time.  Changes to insurance: No  Questions for the pharmacist: No    Confirmed patient received Welcome Packet with first shipment. The patient will receive a drug information handout for each medication shipped and additional FDA Medication Guides as required.       DISEASE/MEDICATION-SPECIFIC INFORMATION        For patients on injectable medications: Patient currently has 2 doses left.  Next injection is scheduled for 9/9.    SPECIALTY MEDICATION ADHERENCE     Medication Adherence    Patient reported X missed doses in the last month: 0  Specialty Medication: Enbrel  Patient is on additional specialty medications: No  Patient is on more than two specialty medications: No  Any gaps in refill history greater than 2 weeks in the last 3 months: no  Demonstrates understanding of importance of adherence: yes  Informant: patient                Enbrel 50mg /ml: Patient has 14 days of medication on hand      SHIPPING     Shipping address confirmed in Epic.     Delivery Scheduled: Yes, Expected medication delivery date: 9/10.     Medication will be delivered via Same Day Courier to the prescription address in Epic WAM.    Olga Millers   [facility] Specialty Technician

## 2020-08-27 ENCOUNTER — Non-Acute Institutional Stay: Admit: 2020-08-27 | Payer: MEDICARE

## 2020-08-27 NOTE — Unmapped (Unsigned)
[facility]   Assessment/Plan:   44 y.o. woman with h/o seropositive RA (dx 2005), hypothyroidism, asthma and GERD presenting for follow-up of RA.     Last seen 12/21/2019 via Telemedicine by Dr. Ilsa Gray    Currently maintained on Enbrel 50 mg qweekly.   ??  1. Seropositive RA - well controlled  - last labs stable 12/28/19  - Continue Enbrel 50 mg qweek  - Recommend OTC voltaren gel  - Counseled tylenol 1000 mg TID PRN for lower back and shoulder pain; Advised trying to decrease naproxen to once a day, counseled gastric protection  ??  2. Healthcare Maintenance  - Immunizations:  Immunization History   Administered Date(s) Administered   ??? COVID-19 VACC,MRNA,(PFIZER)(PF)(IM) 03/25/2020   ??? Influenza Vaccine Quad (IIV4 PF) 57mo+ injectable 09/21/2016, 02/21/2018   ??? PNEUMOCOCCAL POLYSACCHARIDE 23 08/09/2017   ??? Pneumococcal Conjugate 13-Valent 12/30/2015     F/U as scheduled in September with Dr. Ilsa Gray      I personally spent *** minutes face-to-face and non-face-to-face in the care of this patient, which includes all pre, intra, and post visit time on the date of service.          ____________________________________________________________________    Primary Care Provider: Scarlette Gray, ANP       HPI:  Yvonne Gray is a 44 y.o. woman with h/o seropositive RA (dx 2005), hypothyroidism, asthma and GERD presenting for follow-up. Of RA.     Last seen 04/2020 via Telemedicine by Yvonne Gray     Currently maintained on Enbrel 50 mg qweekly.     Interim History:       Review of Systems:  10 system ROS performed and negative other than mentioned above    Rheumatologic History:   Ms. Yvonne Gray is a woman with h/o seropositive RA (+CCP, +RF) who was diagnosed in 2005.  She followed with Dr. Gavin Gray in [place], [place] but established care with [facility] 03/2014 (several no shows occurred until 07/2015).  She was previously treated with MTX (both oral and injectable formulations), which was discontinued 2/2 GI upset.  She was then placed on  Enbrel (~2013) on which she still remains. In 02/2017, plaquenil 200 mg BID added to regimen given ongoing pain and stiffness (agent selection based on patient preference to avoid methotrexate or leflunomide given concern for further immunosuppression).  During 07/2018 visit, patient had self-discontinued plaquenil without change in symptoms.       Medical History:  Past Medical History:   Diagnosis Date   ??? Asthma    ??? GERD (gastroesophageal reflux disease)    ??? Rheumatoid arthritis(714.0)        Allergies:  Nitrofuran analogues; Nitrofuran derivative; Strawberry; Zantac [ranitidine hcl]; Adhesive tape-silicones; and Latex, natural rubber    Medications:     Current Outpatient Medications:   ???  albuterol (PROVENTIL HFA;VENTOLIN HFA) 90 mcg/actuation inhaler, Inhale 2 puffs every six (6) hours as needed for wheezing., Disp: , Rfl:   ???  aspirin-acetaminophen-caffeine (EXCEDRIN MIGRAINE) 250-250-65 mg per tablet, Take 1 tablet by mouth every six (6) hours as needed for pain., Disp: , Rfl:   ???  baclofen (LIORESAL) 20 MG tablet, Take 20 mg by mouth 4 (four) times a day as needed (spasms)., Disp: , Rfl:   ???  empagliflozin 10 mg tablet, Take 25 mg by mouth daily. Jardiance, Disp: , Rfl:   ???  empty container Misc, Use as directed, Disp: 1 each, Rfl: 2  ???  etanercept (ENBREL) injection  50 mg/mL PEN, Inject the contents of 1 pen (50mg ) under the skin every 7 days, Disp: 12 mL, Rfl: 3  ???  insulin degludec (TRESIBA FLEXTOUCH U-100) 100 unit/mL (3 mL) InPn, Inject 10 Units under the skin daily., Disp: , Rfl:   ???  levothyroxine (SYNTHROID, LEVOTHROID) 175 MCG tablet, Take 175 mcg by mouth daily. Mon and Thursday , Disp: , Rfl:   ???  liraglutide (VICTOZA 2-PAK) 0.6 mg/0.1 mL (18 mg/3 mL) injection, Inject 1.8 mg under the skin daily. , Disp: , Rfl:   ???  montelukast (SINGULAIR) 10 mg tablet, Take 10 mg by mouth nightly., Disp: , Rfl:   ???  naproxen (NAPROSYN) 500 MG tablet, Take 1 tablet (500 mg total) by mouth two (2) times a day as needed (take with food)., Disp: 60 tablet, Rfl: 1  ???  omeprazole (PRILOSEC) 20 MG capsule, Take 20 mg by mouth daily., Disp: , Rfl:   ???  ondansetron (ZOFRAN) 4 MG tablet, Take 4 mg by mouth every eight (8) hours as needed for nausea., Disp: , Rfl:   ???  oxyCODONE-acetaminophen (PERCOCET) 5-325 mg per tablet, Take 1 tablet by mouth every six (6) hours as needed for pain., Disp: , Rfl:   ???  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 Smoking status: Former Smoker     Quit date: 04/02/2010     Years since quitting: 10.4   ??? Smokeless tobacco: Never Used   Substance Use Topics   ??? Alcohol use: No     Alcohol/week: 0.0 standard drinks   ??? Drug use: No       Family History:  Family History   Problem Relation Age of Onset   ??? Hypertension Mother    ??? Diabetes Mother    ??? Urolithiasis Neg Hx          Physical Exam  There were no vitals filed for this visit.    General:   Patient is well appearing, NAD   Eyes:   EOMI, and sclera aniteric. Wearing glasses   ENT:   MMM. Oropharhynx without any erythema or exudate.  No oropharyngeal exudates or ulcerations . Poor dentition.   Neck:   Supple, no cervical lymphadenopathy   Cardiopulmonary:  Speaking in full sentences; normal WOB, no cough, no wheezing   Skin:    No rash/lesions/breakdown on visible skin     Psychiatry:   Mood and affect appropriate and congruent.   Musculo Skeletal:   No obvious joint swelling in her hands or wrists.  Able to make tight fist b/l. Full ROM b/l hands, wrists, elbows and shoulders.   Neurological:  Cranial nerves III-XII grossly intact.  Strength grossly intact       Test Results  No visits with results within 4 Week(s) from this visit.   Latest known visit with results is: Appointment on 12/28/2019   Component Date Value   ??? Sodium 12/28/2019 134*   ??? Potassium 12/28/2019 4.9    ??? Chloride 12/28/2019 103    ??? Anion Gap 12/28/2019 4*   ??? CO2 12/28/2019 27.0    ??? BUN 12/28/2019 12    ??? Creatinine 12/28/2019 0.50*   ??? BUN/Creatinine Ratio 12/28/2019  24    ??? EGFR CKD-EPI Non-African* 12/28/2019 >90    ??? EGFR CKD-EPI African Ame* 12/28/2019 >90    ??? Glucose 12/28/2019 128    ??? Calcium 12/28/2019 9.1    ??? Albumin 12/28/2019 3.6    ??? Total Protein 12/28/2019 7.4    ??? Total Bilirubin 12/28/2019 0.6    ??? AST 12/28/2019 28    ??? ALT 12/28/2019 29    ??? Alkaline Phosphatase 12/28/2019 132*   ??? CRP 12/28/2019 22.9*   ??? Sed Rate 12/28/2019 28*   ??? WBC 12/28/2019 9.2    ??? RBC 12/28/2019 4.99    ??? HGB 12/28/2019 13.4    ??? HCT 12/28/2019 41.5    ??? MCV 12/28/2019 83.0    ??? MCH 12/28/2019 26.9    ??? MCHC 12/28/2019 32.4    ??? RDW 12/28/2019 14.1    ??? MPV 12/28/2019 7.5    ??? Platelet 12/28/2019 257    ??? Neutrophils % 12/28/2019 60.3    ??? Lymphocytes % 12/28/2019 30.6    ??? Monocytes % 12/28/2019 4.2    ??? Eosinophils % 12/28/2019 2.1    ??? Basophils % 12/28/2019 0.6    ??? Absolute Neutrophils 12/28/2019 5.6    ??? Absolute Lymphocytes 12/28/2019 2.8    ??? Absolute Monocytes 12/28/2019 0.4    ??? Absolute Eosinophils 12/28/2019 0.2    ??? Absolute Basophils 12/28/2019 0.1    ??? Large Unstained Cells 12/28/2019 2    ??? Hypochromasia 12/28/2019 Slight*      Ref. Range 04/02/2014 10:35 05/04/2016 11:53   ANA Titer 1 Unknown  1:320   Antinuclear Antibodies (ANA) Latest Ref Range: Negative   Positive (A)   Pattern 1 (ANA) Unknown  Homogenous   ENA Screen Latest Ref Range: <0.70 ENA Units  0.50   Rheumatoid Factor Latest Ref Range: 0.0 - 15.0 [IU]/mL 26.9 (H)    CCP Antibodies Latest Ref Range: NEGATIVE  Positive    CCP IgG Antibodies Unknown 20

## 2020-08-30 MED FILL — ENBREL SURECLICK 50 MG/ML (1 ML) SUBCUTANEOUS PEN INJECTOR: 28 days supply | Qty: 4 | Fill #5 | Status: AC

## 2020-08-30 MED FILL — ENBREL SURECLICK 50 MG/ML (1 ML) SUBCUTANEOUS PEN INJECTOR: SUBCUTANEOUS | 28 days supply | Qty: 4 | Fill #5

## 2020-09-20 NOTE — Unmapped (Signed)
[facility] Clinical Assessment & Refill Coordination Note    Yvonne Gray, [place]: 10/27/1976  Phone: [phone] (home)     All above HIPAA information was verified with patient.     Was a Nurse, learning disability used for this call? No    Specialty Medication(s):   Inflammatory Disorders: Enbrel     Current Outpatient Medications   Medication Sig Dispense Refill   ??? albuterol (PROVENTIL HFA;VENTOLIN HFA) 90 mcg/actuation inhaler Inhale 2 puffs every six (6) hours as needed for wheezing.     ??? aspirin-acetaminophen-caffeine (EXCEDRIN MIGRAINE) 250-250-65 mg per tablet Take 1 tablet by mouth every six (6) hours as needed for pain.     ??? baclofen (LIORESAL) 20 MG tablet Take 20 mg by mouth 4 (four) times a day as needed (spasms).     ??? empagliflozin 10 mg tablet Take 25 mg by mouth daily. Jardiance     ??? empty container Misc Use as directed 1 each 2   ??? etanercept (ENBREL) injection 50 mg/mL PEN Inject the contents of 1 pen (50mg ) under the skin every 7 days 12 mL 3   ??? insulin degludec (TRESIBA FLEXTOUCH U-100) 100 unit/mL (3 mL) InPn Inject 10 Units under the skin daily.     ??? levothyroxine (SYNTHROID, LEVOTHROID) 175 MCG tablet Take 175 mcg by mouth daily. Mon and Thursday      ??? liraglutide (VICTOZA 2-PAK) 0.6 mg/0.1 mL (18 mg/3 mL) injection Inject 1.8 mg under the skin daily.      ??? montelukast (SINGULAIR) 10 mg tablet Take 10 mg by mouth nightly.     ??? naproxen (NAPROSYN) 500 MG tablet Take 1 tablet (500 mg total) by mouth two (2) times a day as needed (take with food). 60 tablet 1   ??? omeprazole (PRILOSEC) 20 MG capsule Take 20 mg by mouth daily.     ??? ondansetron (ZOFRAN) 4 MG tablet Take 4 mg by mouth every eight (8) hours as needed for nausea.     ??? oxyCODONE-acetaminophen (PERCOCET) 5-325 mg per tablet Take 1 tablet by mouth every six (6) hours as needed for pain.     ??? traZODone (DESYREL) 50 MG tablet Take 50 mg by mouth nightly as needed for sleep.       No current facility-administered medications for this visit.        Changes to medications: Sonji reports no changes at this time.    Allergies   Allergen Reactions   ??? Nitrofuran Analogues Rash     jittery   ??? Nitrofuran Derivative Rash     jittery   ??? Strawberry Swelling     Facial swelling   ??? Zantac [Ranitidine Hcl]       Causes jitters   ??? Adhesive Tape-Silicones Rash   ??? Latex, Natural Rubber Rash       Changes to allergies: No    SPECIALTY MEDICATION ADHERENCE     Enbrel 50mg /ml: 14 days of medicine on hand     Medication Adherence    Patient reported X missed doses in the last month: 0  Specialty Medication: Enbrel 50mg /ml          Specialty medication(s) dose(s) confirmed: Regimen is correct and unchanged.     Are there any concerns with adherence? No    Adherence counseling provided? Not needed    CLINICAL MANAGEMENT AND INTERVENTION      Clinical Benefit Assessment:    Do you feel the medicine is effective or helping  your condition? Yes    Clinical Benefit counseling provided? Not needed    Adverse Effects Assessment:    Are you experiencing any side effects? No    Are you experiencing difficulty administering your medicine? No    Quality of Life Assessment:    Rheumatology:   Quality of Life    On a scale of 1 ??? 10 with 1 representing not at all and 10 representing completely ??? how has your rheumatologic condition affected your:  Daily pain level?: decline to answer  Ability to complete your regular daily tasks (prepare meals, get dressed, etc.)?: decline to answer  Ability to participate in social or family activities?: decline to answer         Have you discussed this with your provider? Not needed    Therapy Appropriateness:    Is therapy appropriate? Yes, therapy is appropriate and should be continued    DISEASE/MEDICATION-SPECIFIC INFORMATION      For patients on injectable medications: Patient currently has 2 doses left.  Next injection is scheduled for 09/22/2020.    PATIENT SPECIFIC NEEDS     - Does the patient have any physical, cognitive, or cultural barriers? No    - Is the patient high risk? No    - Does the patient require a Care Management Plan? No     - Does the patient require physician intervention or other additional services (i.e. nutrition, smoking cessation, social work)? No      SHIPPING     Specialty Medication(s) to be Shipped:   Inflammatory Disorders: Enbrel 50mg /ml    Other medication(s) to be shipped: No additional medications requested for fill at this time     Changes to insurance: No    Delivery Scheduled: Yes, Expected medication delivery date: 10/01/2020.     Medication will be delivered via Same Day Courier to the confirmed prescription address in [facility].    The patient will receive a drug information handout for each medication shipped and additional FDA Medication Guides as required.  Verified that patient has previously received a Conservation officer, historic buildings.    All of the patient's questions and concerns have been addressed.    Yvonne Gray   [facility] Specialty Pharmacist

## 2020-10-01 MED FILL — ENBREL SURECLICK 50 MG/ML (1 ML) SUBCUTANEOUS PEN INJECTOR: 28 days supply | Qty: 4 | Fill #6 | Status: AC

## 2020-10-01 MED FILL — ENBREL SURECLICK 50 MG/ML (1 ML) SUBCUTANEOUS PEN INJECTOR: SUBCUTANEOUS | 28 days supply | Qty: 4 | Fill #6

## 2020-10-28 NOTE — Unmapped (Signed)
[facility] Refill Coordination Note    Specialty Medication(s) to be Shipped:   Inflammatory Disorders: Enbrel    Other medication(s) to be shipped: No additional medications requested for fill at this time     Yvonne Gray, DOB: January 21, 1976  Phone: [phone] (home)       All above HIPAA information was verified with patient.     Was a Nurse, learning disability used for this call? No    Completed refill call assessment today to schedule patient's medication shipment from the [facility] [phone]).       Specialty medication(s) and dose(s) confirmed: Regimen is correct and unchanged.   Changes to medications: Karlin reports no changes at this time.  Changes to insurance: No  Questions for the pharmacist: No    Confirmed patient received Welcome Packet with first shipment. The patient will receive a drug information handout for each medication shipped and additional FDA Medication Guides as required.       DISEASE/MEDICATION-SPECIFIC INFORMATION        For patients on injectable medications: Patient currently has 1 doses left.  Next injection is scheduled for 11/10/20.    SPECIALTY MEDICATION ADHERENCE     Medication Adherence    Patient reported X missed doses in the last month: 0  Specialty Medication: enbrel 50mg /ml  Patient is on additional specialty medications: No                enbrel 50 mg/ml: 13 days of medicine on hand          SHIPPING     Shipping address confirmed in Epic.     Delivery Scheduled: Yes, Expected medication delivery date: 11/06/20.     Medication will be delivered via Same Day Courier to the prescription address in Epic [facility].    Unk Lightning   [facility] Specialty Technician

## 2020-11-06 MED FILL — ENBREL SURECLICK 50 MG/ML (1 ML) SUBCUTANEOUS PEN INJECTOR: SUBCUTANEOUS | 28 days supply | Qty: 4 | Fill #7

## 2020-11-06 MED FILL — ENBREL SURECLICK 50 MG/ML (1 ML) SUBCUTANEOUS PEN INJECTOR: 28 days supply | Qty: 4 | Fill #7 | Status: AC

## 2020-11-29 NOTE — Unmapped (Signed)
[facility] Refill Coordination Note    Specialty Medication(s) to be Shipped:   Inflammatory Disorders: Enbrel    Other medication(s) to be shipped: No additional medications requested for fill at this time     Anwar Crill, DOB: 1976-09-17  Phone: [phone] (home)       All above HIPAA information was verified with patient.     Was a Nurse, learning disability used for this call? No    Completed refill call assessment today to schedule patient's medication shipment from the [facility] [phone]).       Specialty medication(s) and dose(s) confirmed: Regimen is correct and unchanged.   Changes to medications: Olita reports no changes at this time.  Changes to insurance: No  Questions for the pharmacist: No    Confirmed patient received Welcome Packet with first shipment. The patient will receive a drug information handout for each medication shipped and additional FDA Medication Guides as required.       DISEASE/MEDICATION-SPECIFIC INFORMATION        For patients on injectable medications: Patient currently has 1 doses left.  Next injection is scheduled for 12/12.    SPECIALTY MEDICATION ADHERENCE     Medication Adherence    Patient reported X missed doses in the last month: 0  Specialty Medication: Enbrel  Patient is on additional specialty medications: No  Patient is on more than two specialty medications: No  Any gaps in refill history greater than 2 weeks in the last 3 months: no  Demonstrates understanding of importance of adherence: yes  Informant: patient                Enbrel 50mg /ml: Patient has 7 days of medication on hand      SHIPPING     Shipping address confirmed in Epic.     Delivery Scheduled: Yes, Expected medication delivery date: 12/16.     Medication will be delivered via Same Day Courier to the prescription address in Epic [facility].    Olga Millers   [facility] Specialty Technician

## 2020-12-05 MED FILL — ENBREL SURECLICK 50 MG/ML (1 ML) SUBCUTANEOUS PEN INJECTOR: 28 days supply | Qty: 4 | Fill #8 | Status: AC

## 2020-12-05 MED FILL — ENBREL SURECLICK 50 MG/ML (1 ML) SUBCUTANEOUS PEN INJECTOR: SUBCUTANEOUS | 28 days supply | Qty: 4 | Fill #8

## 2020-12-06 ENCOUNTER — Telehealth: Payer: Self-pay | Admitting: Family Medicine

## 2020-12-06 NOTE — Telephone Encounter (Signed)
Copied from CRM [id number]. Topic: General - Inquiry >> Dec 06, 2020  2:58 PM Daphine Deutscher D wrote: Reason for CRM: pt has an appt to do a virtual appt Monday with Dr. Orson Aloe.  She has been feeling well, headaches and fatigue and her glucose has been up.  She wants to know if between now and Monday a nurse can call her back about her elevated glucose.  CB#  [phone]

## 2020-12-06 NOTE — Progress Notes (Signed)
Name: Ashley Cochran   MRN: [id number]    DOB: 04/06/1976   Date:12/09/2020       Progress Note  Subjective  Chief Complaint  Chief Complaint  Patient presents with  . Medication Refill    I connected with  Ashley Cochran on 12/09/20 at 11:00 AM EST by telephone and verified that I am speaking with the correct person using two identifiers.  I discussed the limitations, risks, security and privacy concerns of performing an evaluation and management service by telephone and the availability of in person appointments. The patient expressed understanding and agreed to proceed. Staff also discussed with the patient that there may be a patient responsible charge related to this service. Patient Location: in vehicle Provider Location: [facility] Additional Individuals present: none  HPI  Patient is a 44 year old female patient of Ashley Cochran Last visit with her was in July 2021 as a televisit The note from that visit was as follows:  Need refill on some home meds, last OV with PCP was Dec, need f/up appt  Pt with IDDM, not checking sugars, she things her sugars are probably high cause of urinary frequency   Patient is new to me, has multiple chronic conditions and seems that she is either noncompliant or has run out of medications and it was somewhat lost to follow-up she was encouraged to come in this month for routine follow-up so we can check her diabetes and refill her medications  Follows up today for medication refill via telephone visit She had a refill requests for multiple medicines including Symbicort and albuterol inhalers as well as an albuterol product via nebulizer which she uses.  Also for empagliflozin and omeprazole.  Takes albuterol via nebulizer when gets stuffy and cold sx's, not taken symbicort in past month as not have. Notes has asthma and does get SOB at times, and wants to start taking again.  Has regular albuterol to use prn, tries not to take medication often.    Takes omeprazole for GERD every day, if misses dose, will know it.  Sometimes she takes an extra omeprazole at night if more symptomatic.  Also notes her sugars have been spiking some. Had a HA a few days ago and checked sugar and was 498. Took two of the Jardiance and went down to 297. Notes not on insulin and was on a shot [place]) for her sugars and cannot afford this and has not been on this for months.  Right now only medicine taking for sugars is Jardiance. Not very well controlled recent past. Yesterday sugar was 170. Noted usually 90-130 when under control and that was just before started the [place] and the [place] a couple years ago.  Likely has not been well controlled in the recent past.  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  Also not have all of her medicines.  Compliance has been a concern.  She noted that in general she has been feeling okay.  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  I saw her once 08/19/2020 with back pain.  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 She followed with Dr. Gavin Cochran  in [place], [place] but established care with [facility]umat[facility] 03/2014 (several no shows occurred until 07/2015). She was previously treated with MTX (both oral and injectable formulations), which was discontinued 2/2 GI upset. She was then placed on Enbrel (~2013) on which she still remains. In 02/2017, plaquenil 200 mg BID added to regimen given ongoing pain and stiffness (agent selection based on patient preference to avoid methotrexate or leflunomide given concern for further immunosuppression). During 07/2018 visit, patient had self-discontinued plaquenil  without change in symptoms.              On her last visit with rheumatology in May 2021, the following was noted:                 Pt presents via video call for f/u today. She reports she is doing well. Her joint are doing well since restarting Enbrel at last visit. She denies any current joint pain. She does have some intermittent ankle swelling at the end of the day but no pain with this. She endorses AM stiffness which resolves within a few mins.  She is taking her Enbrel weekly and tolerating well. She is still taking Naproxen BID.                  Assessment/plan:       1. Seropositive RA - well controlled - last labs stable 12/28/19 - Continue Enbrel 50 mg qweek - Recommend OTC voltaren gel - Counseled tylenol 1000 mg TID PRN for lower back and shoulder pain; Advised trying to decrease naproxen to once a day, counseled gastric protection  Her active problem list is positive for diabetes, obesity, hyperlipidemia, asthma, GERD, and hypothyroid.    Patient Active Problem List   Diagnosis Date Noted  . Acute bilateral low back pain without sciatica 08/19/2020  . Asthma with acute exacerbation 06/21/2020  . H/O gastroesophageal reflux (GERD) 06/21/2020  . Class 3 severe obesity due to excess calories with serious comorbidity and body mass index (BMI) of 40.0 to 44.9 in adult (HCC) 01/26/2019  . Uncontrolled type 2 diabetes mellitus with hyperglycemia (HCC) 01/26/2019  . Dyslipidemia associated with type 2 diabetes mellitus (HCC) 01/26/2019  . Hypothyroidism 04/09/2015  . Rheumatoid arthritis (HCC) 04/09/2015    History reviewed. No pertinent surgical history.  Family History  Problem Relation Age of Onset  . Hypertension Mother   . Diabetes Mother        type 2  . Asthma Mother   . Stroke Mother        Mini strokes  . GER disease Mother   . Hypertension Father   . Diabetes Father        Type 2  . Asthma Father   . GER disease Father   . Asthma Sister   . Allergies Sister    . Asthma Brother   . Allergies Brother     Social History   Tobacco Use  . Smoking status: Former Smoker    Quit date: 09/20/2004    Years since quitting: 16.2  . Smokeless tobacco: Never Used  Substance Use Topics  . Alcohol use: No     Current Outpatient Medications:  .  albuterol (PROVENTIL HFA;VENTOLIN HFA) 108 (90 Base) MCG/ACT inhaler, Inhale 2 puffs into the lungs every 6 (six) hours as needed for wheezing or shortness of breath., Disp: 3 Inhaler, Rfl: 4 .  albuterol (PROVENTIL) (2.5 MG/3ML) 0.083% nebulizer solution, Take 3 mLs (2.5 mg total) by nebulization every 6 (six) hours  as needed for wheezing or shortness of breath., Disp: 150 mL, Rfl: 1 .  baclofen (LIORESAL) 20 MG tablet, Take by mouth., Disp: , Rfl:  .  budesonide-formoterol (SYMBICORT) 80-4.5 MCG/ACT inhaler, Inhale 2 puffs into the lungs 2 (two) times daily., Disp: 1 Inhaler, Rfl: 3 .  cyclobenzaprine (FLEXERIL) 5 MG tablet, cyclobenzaprine 5 mg tablet  Take 1 tablet as needed by oral route at bedtime for 7 days., Disp: , Rfl:  .  empagliflozin (JARDIANCE) 25 MG TABS tablet, Take 25 mg by mouth daily., Disp: 90 tablet, Rfl: 1 .  ENBREL SURECLICK 50 MG/ML injection, Inject into the skin., Disp: , Rfl:  .  fluticasone (FLONASE) 50 MCG/ACT nasal spray, Place 2 sprays into both nostrils daily., Disp: 16 g, Rfl: 6 .  ipratropium-albuterol (DUONEB) 0.5-2.5 (3) MG/3ML SOLN, Take 3 mLs by nebulization every 6 (six) hours as needed., Disp: 180 mL, Rfl: 1 .  levothyroxine (SYNTHROID) 175 MCG tablet, TAKE ONE TABLET BY MOUTH EVERY DAY, Disp: 30 tablet, Rfl: 0 .  montelukast (SINGULAIR) 10 MG tablet, TAKE ONE TABLET BY MOUTH ONCE DAILY FOR ASTHMA, Disp: 90 tablet, Rfl: 3 .  naproxen (NAPROSYN) 500 MG tablet, naproxen 500 mg tablet  Take 1 tablet twice a day by oral route., Disp: , Rfl:  .  omeprazole (PRILOSEC) 20 MG capsule, Take 1 capsule (20 mg total) by mouth daily., Disp: 90 capsule, Rfl: 1 .  ondansetron (ZOFRAN) 4 MG  tablet, Take 1 tablet (4 mg total) by mouth 2 (two) times daily., Disp: 20 tablet, Rfl: 1 .  OVER THE COUNTER MEDICATION, Apply 1 application topically daily. Triderma MD, Disp: , Rfl:  .  prochlorperazine (COMPAZINE) 10 MG tablet, Take 1 tablet (10 mg total) by mouth every 6 (six) hours as needed for nausea or vomiting., Disp: 12 tablet, Rfl: 0 .  promethazine (PHENERGAN) 12.5 MG tablet, Take 1 tablet (12.5 mg total) by mouth every 8 (eight) hours as needed for nausea or vomiting. *DO NOT TAKE WITH Promethazine-DM cough syrup*, Disp: 10 tablet, Rfl: 0 .  tiZANidine (ZANAFLEX) 4 MG tablet, Take 1 tablet (4 mg total) by mouth every 8 (eight) hours as needed for muscle spasms., Disp: 30 tablet, Rfl: 0 .  traZODone (DESYREL) 50 MG tablet, Take 1 tablet (50 mg total) by mouth at bedtime., Disp: 90 tablet, Rfl: 4  Allergies  Allergen Reactions  . Nitrofuran Derivatives Rash    jittery  . Latex   . Ranitidine Hcl      Causes jitters  . Strawberry Flavor   . Tape Rash    With staff assistance, above reviewed with the patient today.  ROS: As per HPI, otherwise no specific complaints on a limited and focused system review   Objective  Virtual encounter, vitals not obtained.  There is no height or weight on file to calculate BMI.  Physical Exam   Appears in NAD via conversation, pleasant Breathing: No obvious respiratory distress. Speaking in complete sentences Neurological: Pt is alert, Speech is normal Psychiatric: Patient has a normal mood and affect, Judgment and thought content normal.   No results found for this or any previous visit (from the past 72 hour(s)).  PHQ2/9: Depression screen [facility] 2/9 12/09/2020 08/19/2020 06/21/2020 01/03/2020 12/28/2019  Decreased Interest 0 0 0 0 0  Down, Depressed, Hopeless 0 0 0 0 0  PHQ - 2 Score 0 0 0 0 0  Altered sleeping - - 0 0 3  Tired, decreased energy - - 0 0 0  Change in appetite - - 0 0 0  Feeling bad or failure about yourself  - - 0 0 0   Trouble concentrating - - 0 0 0  Moving slowly or fidgety/restless - - 0 0 0  Suicidal thoughts - - 0 0 0  PHQ-9 Score - - 0 0 3  Difficult doing work/chores - - Not difficult at all Not difficult at all Not difficult at all  Some recent data might be hidden   PHQ-2/9 Result reviewed  Fall Risk: Fall Risk  12/09/2020 08/19/2020 06/21/2020 01/03/2020 12/28/2019  Falls in the past year? 0 0 0 0 0  Number falls in past yr: 0 0 0 0 0  Injury with Fall? 0 - 0 0 0  Follow up - Falls evaluation completed - Falls evaluation completed Falls evaluation completed     Assessment & Plan  1. Uncontrolled type 2 diabetes mellitus with hyperglycemia (HCC) I noted to the patient on her phone visit today the concerns that her blood sugars have not been well controlled in the past, and the importance of following up with her PCP with an in person visit in the very near future.  Noted concerns that can arise with uncontrolled blood sugars. Agreed to refill her [place] today, and is very concerned if she was without that medicine presently. She needs lab work done as well, and asked that she make a follow-up with her PCP in the next couple weeks, and she notes she definitely would very early in 2022 at the latest. I did note that in the future, medications would likely not be refilled again unless she has had a follow-up visit here.  - empagliflozin (JARDIANCE) 25 MG TABS tablet; Take 1 tablet (25 mg total) by mouth daily.  Dispense: 90 tablet; Refill: 0  2. H/O gastroesophageal reflux (GERD) She notes the omeprazole is very helpful with her reflux, and did refill that for her today.  - omeprazole (PRILOSEC) 20 MG capsule; Take 1 capsule (20 mg total) by mouth daily.  Dispense: 90 capsule; Refill: 0   4. Mild intermittent asthma without complication Her asthma history is not entirely clear, although she was on a Symbicort product to help manage asthma on a daily basis.  She has not been taking that  regularly in the last month.  She has an albuterol nebulizer which she uses to help at times, and also has an albuterol inhaler to use as needed, and she tries not to use that very frequently. Did refill the medications for her today, although emphasized the importance of a follow-up visit to help ensure she has a regimen which is best to help in the management of her asthma. Her diagnosis may be more a persistent asthma, with an unclear severity history.  It seems that she mostly has asthma concerns when she has infections.  - albuterol (VENTOLIN HFA) 108 (90 Base) MCG/ACT inhaler; Inhale 2 puffs into the lungs every 6 (six) hours as needed for wheezing or shortness of breath.  Dispense: 6.7 g; Refill: 1 - albuterol (PROVENTIL) (2.5 MG/3ML) 0.083% nebulizer solution; Take 3 mLs (2.5 mg total) by nebulization every 6 (six) hours as needed for wheezing or shortness of breath.  Dispense: 100 mL; Refill: 1 - budesonide-formoterol (SYMBICORT) 80-4.5 MCG/ACT inhaler; Inhale 2 puffs into the lungs 2 (two) times daily.  Dispense: 6.9 g; Refill: 1  5. Encounter for medication monitoring I noted concerns with just having medicines refilled and not being evaluated, and emphasized the importance  of a follow-up with her PCP within the next couple weeks.  She was understanding of this and stated she would do so.  Can follow-up sooner as needed.  I also have concerns about compliance issues with medications as well in her past.  An in person follow-up visit with Ashley Cochran, her PCP is to be scheduled in the next couple weeks.  I discussed the assessment and treatment plan with the patient. The patient was provided an opportunity to ask questions and all were answered. The patient agreed with the plan and demonstrated an understanding of the instructions.   I provided 20 minutes of non-face-to-face time during this encounter that included discussing at length patient's sx/history, pertinent pmhx, medications,  treatment and follow up plan. This time also included the necessary documentation, orders, and chart review.  Jamelle Haring, MD

## 2020-12-09 ENCOUNTER — Telehealth (INDEPENDENT_AMBULATORY_CARE_PROVIDER_SITE_OTHER): Payer: BC Managed Care – PPO | Admitting: Internal Medicine

## 2020-12-09 ENCOUNTER — Other Ambulatory Visit: Payer: Self-pay

## 2020-12-09 ENCOUNTER — Encounter: Payer: Self-pay | Admitting: Internal Medicine

## 2020-12-09 DIAGNOSIS — E1165 Type 2 diabetes mellitus with hyperglycemia: Secondary | ICD-10-CM | POA: Diagnosis not present

## 2020-12-09 DIAGNOSIS — J4520 Mild intermittent asthma, uncomplicated: Secondary | ICD-10-CM

## 2020-12-09 DIAGNOSIS — Z8719 Personal history of other diseases of the digestive system: Secondary | ICD-10-CM | POA: Diagnosis not present

## 2020-12-09 DIAGNOSIS — Z20822 Contact with and (suspected) exposure to covid-19: Secondary | ICD-10-CM

## 2020-12-09 DIAGNOSIS — Z5181 Encounter for therapeutic drug level monitoring: Secondary | ICD-10-CM

## 2020-12-09 MED ORDER — ALBUTEROL SULFATE HFA 108 (90 BASE) MCG/ACT IN AERS: 2.0000 | INHALATION_SPRAY | Freq: Four times a day (QID) | RESPIRATORY_TRACT | 1 refills | Status: DC | PRN

## 2020-12-09 MED ORDER — ALBUTEROL SULFATE (2.5 MG/3ML) 0.083% IN NEBU: 2.5000 mg | INHALATION_SOLUTION | Freq: Four times a day (QID) | RESPIRATORY_TRACT | 1 refills | Status: AC | PRN

## 2020-12-09 MED ORDER — OMEPRAZOLE 20 MG PO CPDR: 20.0000 mg | DELAYED_RELEASE_CAPSULE | Freq: Every day | ORAL | 0 refills | Status: DC

## 2020-12-09 MED ORDER — EMPAGLIFLOZIN 25 MG PO TABS: 25.0000 mg | ORAL_TABLET | Freq: Every day | ORAL | 0 refills | Status: DC

## 2020-12-09 MED ORDER — BUDESONIDE-FORMOTEROL FUMARATE 80-4.5 MCG/ACT IN AERO: 2.0000 | INHALATION_SPRAY | Freq: Two times a day (BID) | RESPIRATORY_TRACT | 1 refills | Status: DC

## 2020-12-09 NOTE — Telephone Encounter (Signed)
Patient will discuss at appointment with Dr.Hendrickson on Monday

## 2020-12-24 ENCOUNTER — Encounter: Admit: 2020-12-24 | Discharge: 2020-12-24 | Payer: MEDICARE

## 2020-12-24 ENCOUNTER — Ambulatory Visit: Admit: 2020-12-24 | Discharge: 2020-12-24 | Payer: MEDICARE

## 2020-12-24 DIAGNOSIS — E039 Hypothyroidism, unspecified: Principal | ICD-10-CM

## 2020-12-24 DIAGNOSIS — J45909 Unspecified asthma, uncomplicated: Principal | ICD-10-CM

## 2020-12-24 DIAGNOSIS — Z5181 Encounter for therapeutic drug level monitoring: Principal | ICD-10-CM

## 2020-12-24 DIAGNOSIS — Z888 Allergy status to other drugs, medicaments and biological substances status: Principal | ICD-10-CM

## 2020-12-24 DIAGNOSIS — Z7982 Long term (current) use of aspirin: Principal | ICD-10-CM

## 2020-12-24 DIAGNOSIS — R079 Chest pain, unspecified: Principal | ICD-10-CM

## 2020-12-24 DIAGNOSIS — M059 Rheumatoid arthritis with rheumatoid factor, unspecified: Principal | ICD-10-CM

## 2020-12-24 DIAGNOSIS — Z23 Encounter for immunization: Principal | ICD-10-CM

## 2020-12-24 DIAGNOSIS — Z91040 Latex allergy status: Principal | ICD-10-CM

## 2020-12-24 DIAGNOSIS — Z79890 Hormone replacement therapy (postmenopausal): Principal | ICD-10-CM

## 2020-12-24 DIAGNOSIS — Z87891 Personal history of nicotine dependence: Principal | ICD-10-CM

## 2020-12-24 DIAGNOSIS — R0602 Shortness of breath: Principal | ICD-10-CM

## 2020-12-24 DIAGNOSIS — Z79899 Other long term (current) drug therapy: Principal | ICD-10-CM

## 2020-12-24 DIAGNOSIS — K219 Gastro-esophageal reflux disease without esophagitis: Principal | ICD-10-CM

## 2020-12-24 LAB — CBC W/ AUTO DIFF
BASOPHILS ABSOLUTE COUNT: 0 10*9/L (ref 0.0–0.1)
BASOPHILS RELATIVE PERCENT: 0.2 %
EOSINOPHILS ABSOLUTE COUNT: 0.2 10*9/L (ref 0.0–0.7)
EOSINOPHILS RELATIVE PERCENT: 2.2 %
HEMATOCRIT: 42.4 % (ref 35.0–44.0)
HEMOGLOBIN: 14.1 g/dL (ref 12.0–15.5)
LYMPHOCYTES ABSOLUTE COUNT: 3.5 10*9/L (ref 0.7–4.0)
LYMPHOCYTES RELATIVE PERCENT: 33.1 %
MEAN CORPUSCULAR HEMOGLOBIN CONC: 33.3 g/dL (ref 30.0–36.0)
MEAN CORPUSCULAR HEMOGLOBIN: 26.7 pg (ref 26.0–34.0)
MEAN CORPUSCULAR VOLUME: 80.1 fL — ABNORMAL LOW (ref 82.0–98.0)
MEAN PLATELET VOLUME: 7.6 fL (ref 7.0–10.0)
MONOCYTES ABSOLUTE COUNT: 0.6 10*9/L (ref 0.1–1.0)
MONOCYTES RELATIVE PERCENT: 6.2 %
NEUTROPHILS ABSOLUTE COUNT: 6.1 10*9/L (ref 1.7–7.7)
NEUTROPHILS RELATIVE PERCENT: 58.3 %
NUCLEATED RED BLOOD CELLS: 0 /100{WBCs} (ref <=4)
PLATELET COUNT: 259 10*9/L (ref 150–450)
RED BLOOD CELL COUNT: 5.3 10*12/L — ABNORMAL HIGH (ref 3.90–5.03)
RED CELL DISTRIBUTION WIDTH: 13.4 % (ref 12.0–15.0)
WBC ADJUSTED: 10.4 10*9/L (ref 3.5–10.5)

## 2020-12-24 LAB — ALBUMIN: ALBUMIN: 3.4 g/dL (ref 3.4–5.0)

## 2020-12-24 LAB — CREATININE
CREATININE: 0.45 mg/dL — ABNORMAL LOW
EGFR CKD-EPI AA FEMALE: >90 mL/min/{1.73_m2} (ref >=60)
EGFR CKD-EPI NON-AA FEMALE: >90 mL/min/{1.73_m2} (ref >=60)

## 2020-12-24 LAB — ALT: ALT (SGPT): 36 U/L (ref 10–49)

## 2020-12-24 LAB — AST: AST (SGOT): 35 U/L — ABNORMAL HIGH (ref <=34)

## 2020-12-24 MED ORDER — ETANERCEPT 50 MG/ML (1 ML) SUBCUTANEOUS PEN INJECTOR
SUBCUTANEOUS | 3 refills | 84 days | Status: CP
Start: 2020-12-24 — End: 2020-12-26

## 2020-12-24 NOTE — Unmapped (Signed)
[facility] Visit   Assessment/Plan:   45 y.o. woman with h/o seropositive RA (dx 2005), hypothyroidism, asthma and GERD presenting for follow-up of RA.     Last seen 05/2020 via Telemedicine    Currently maintained on Enbrel 50 mg qweekly. Doing well overall, without significant symptom flares.  ??  Seropositive RA - well controlled  - Repeat monitoring labs ordered today  - Continue Enbrel 50 mg qweek  - Discussed voltaren gel OTC if needs NSAIDs for flares    Chest pain, SOB: Reports intermittent episodes of chest pain and shortness of breath, most recently about 3 weeks ago. No abnormalities on exam today, no symptoms currently.  - Discussed importance of being seen and evaluated by PCP for intermittent chest pain, given RA as a risk factor for early CAD  ??  Healthcare Maintenance  - Immunizations: Due for flu shot. Amenable to getting today. UTD on COVID vaccine (boosted 8/16).  Immunization History   Administered Date(s) Administered   ??? COVID-19 VACC,MRNA,(PFIZER)(PF)(IM) 03/25/2020, 04/15/2020, 08/05/2020   ??? Influenza Vaccine Quad (IIV4 PF) 60mo+ injectable 09/21/2016, 02/21/2018, 10/16/2019   ??? PNEUMOCOCCAL POLYSACCHARIDE 23 08/09/2017   ??? Pneumococcal Conjugate 13-Valent 12/30/2015     Video visit     I personally spent 30 minutes face-to-face and non-face-to-face in the care of this patient, which includes all pre, intra, and post visit time on the date of service.    Maeola Sarah, MD, PGY-2  [facility] Internal Medicine  ____________________________________________________________________    Primary Care Provider: Scarlette Slice, ANP         HPI:  Yvonne Gray is a 45 y.o. woman with h/o seropositive RA (dx 2005), hypothyroidism, asthma and GERD presenting for follow-up. Of RA.     Last seen 05/2020 via Telemedicine    Currently, she is maintained on Enbrel 50 mg subcutaneous qweek.     Interim History:   She reports she is doing well. Today, she just has some mild low back pain. Otherwise, no active joint pain or stiffness today. She had a brief flare of symptoms in her hand joints a few days ago that lasted about a day. Has not been taking Naproxen any more due to running out. Has not tried Voltaren gel. She has had significant stress in her life recently due to caring for her mother, aunt and uncle. Denies any issues with affording or taking her Enbrel. Endorses an episode of chest pain and SOB a few weeks ago.    She would like her flu shot today.    Review of Systems:  10 system ROS performed and negative other than mentioned above    Rheumatologic History:   Yvonne Gray is a woman with h/o seropositive RA (+CCP, +RF) who was diagnosed in 2005.  She followed with Dr. Gavin Potters in [place], [place] but established care with [facility] Rheumatology 03/2014 (several no shows occurred until 07/2015).  She was previously treated with MTX (both oral and injectable formulations), which was discontinued 2/2 GI upset.  She was then placed on  Enbrel (~2013) on which she still remains. In 02/2017, plaquenil 200 mg BID added to regimen given ongoing pain and stiffness (agent selection based on patient preference to avoid methotrexate or leflunomide given concern for further immunosuppression).  During 07/2018 visit, patient had self-discontinued plaquenil without change in symptoms.     Medical History:  Past Medical History:   Diagnosis Date   ??? Asthma    ??? GERD (gastroesophageal reflux disease)    ???  Rheumatoid arthritis(714.0)        Allergies:  Nitrofuran analogues; Nitrofuran derivative; Strawberry; Zantac [ranitidine hcl]; Adhesive tape-silicones; and Latex, natural rubber    Medications:     Current Outpatient Medications:   ???  albuterol (PROVENTIL HFA;VENTOLIN HFA) 90 mcg/actuation inhaler, Inhale 2 puffs every six (6) hours as needed for wheezing., Disp: , Rfl:   ???  aspirin-acetaminophen-caffeine (EXCEDRIN MIGRAINE) 250-250-65 mg per tablet, Take 1 tablet by mouth every six (6) hours as needed for pain., Disp: , Rfl:   ???  baclofen (LIORESAL) 20 MG tablet, Take 20 mg by mouth 4 (four) times a day as needed (spasms)., Disp: , Rfl:   ???  empagliflozin 10 mg tablet, Take 25 mg by mouth daily. Jardiance, Disp: , Rfl:   ???  empty container Misc, Use as directed, Disp: 1 each, Rfl: 2  ???  etanercept (ENBREL) injection 50 mg/mL PEN, Inject the contents of 1 pen (50mg ) under the skin every 7 days, Disp: 12 mL, Rfl: 3  ???  insulin degludec (TRESIBA FLEXTOUCH U-100) 100 unit/mL (3 mL) InPn, Inject 10 Units under the skin daily., Disp: , Rfl:   ???  levothyroxine (SYNTHROID, LEVOTHROID) 175 MCG tablet, Take 175 mcg by mouth daily. Mon and Thursday , Disp: , Rfl:   ???  liraglutide (VICTOZA 2-PAK) 0.6 mg/0.1 mL (18 mg/3 mL) injection, Inject 1.8 mg under the skin daily. , Disp: , Rfl:   ???  montelukast (SINGULAIR) 10 mg tablet, Take 10 mg by mouth nightly., Disp: , Rfl:   ???  omeprazole (PRILOSEC) 20 MG capsule, Take 20 mg by mouth daily., Disp: , Rfl:   ???  ondansetron (ZOFRAN) 4 MG tablet, Take 4 mg by mouth every eight (8) hours as needed for nausea., Disp: , Rfl:   ???  oxyCODONE-acetaminophen (PERCOCET) 5-325 mg per tablet, Take 1 tablet by mouth every six (6) hours as needed for pain., Disp: , Rfl:   ???  traZODone (DESYREL) 50 MG tablet, Take 50 mg by mouth nightly as needed for sleep., Disp: , Rfl:   ???  naproxen (NAPROSYN) 500 MG tablet, Take 1 tablet (500 mg total) by mouth two (2) times a day as needed (take with food). 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 Smoking status: Former Smoker     Quit date: 04/02/2010     Years since quitting: 10.7   ??? Smokeless tobacco: Never Used   Substance Use Topics   ??? Alcohol use: No     Alcohol/week: 0.0 standard drinks   ??? Drug use: No       Family History:  Family History   Problem Relation Age of Onset   ??? Hypertension Mother    ??? Diabetes Mother    ??? Urolithiasis Neg Hx          Physical Exam  Vitals:    12/24/20 1418   BP: 113/77   Pulse: 79   Temp: 36.3 ??C       General:   Patient is well appearing, NAD   Eyes:   EOMI, and sclera aniteric. Wearing glasses   ENT:   Wearing a mask.   Cardiac   RRR, no MRG. Extremities warm. No edema to extremities.   Pulmonary:  Lungs  clear to ascultation bilaterally. No wheezes or crackles. Normal WOB on RA.   Skin:    No rash/lesions/breakdown on visible skin     Psychiatry:   Mood and affect appropriate and congruent.   Musculo Skeletal:    Able to make tight fist b/l. Full ROM b/l hands and wrists. No redness, swelling, or tenderness to joints in hands.   Neurological:  Cranial nerves III-XII grossly intact.  Strength grossly intact.       Test Results  No visits with results within 4 Week(s) from this visit.   Latest known visit with results is:   Appointment on 12/28/2019   Component Date Value   ??? Sodium 12/28/2019 134*   ??? Potassium 12/28/2019 4.9    ??? Chloride 12/28/2019 103    ??? Anion Gap 12/28/2019 4*   ??? CO2 12/28/2019 27.0    ??? BUN 12/28/2019 12    ??? Creatinine 12/28/2019 0.50*   ??? BUN/Creatinine Ratio 12/28/2019 24    ??? EGFR CKD-EPI Non-African* 12/28/2019 >90    ??? EGFR CKD-EPI African Ame* 12/28/2019 >90    ??? Glucose 12/28/2019 128    ??? Calcium 12/28/2019 9.1    ??? Albumin 12/28/2019 3.6    ??? Total Protein 12/28/2019 7.4    ??? Total Bilirubin 12/28/2019 0.6    ??? AST 12/28/2019 28    ??? ALT 12/28/2019 29    ??? Alkaline Phosphatase 12/28/2019 132*   ??? CRP 12/28/2019 22.9*   ??? Sed Rate 12/28/2019 28*   ??? WBC 12/28/2019 9.2    ??? RBC 12/28/2019 4.99    ??? HGB 12/28/2019 13.4    ??? HCT 12/28/2019 41.5    ??? MCV 12/28/2019 83.0    ??? MCH 12/28/2019 26.9    ??? MCHC 12/28/2019 32.4    ??? RDW 12/28/2019 14.1    ??? MPV 12/28/2019 7.5    ??? Platelet 12/28/2019 257    ??? Neutrophils % 12/28/2019 60.3    ??? Lymphocytes % 12/28/2019 30.6    ??? Monocytes % 12/28/2019 4.2    ??? Eosinophils % 12/28/2019 2.1    ??? Basophils % 12/28/2019 0.6    ??? Absolute Neutrophils 12/28/2019 5.6    ??? Absolute Lymphocytes 12/28/2019 2.8    ??? Absolute Monocytes 12/28/2019 0.4    ??? Absolute Eosinophils 12/28/2019 0.2    ??? Absolute Basophils 12/28/2019 0.1    ??? Large Unstained Cells 12/28/2019 2    ??? Hypochromasia 12/28/2019 Slight*      Ref. Range 04/02/2014 10:35 05/04/2016 11:53   ANA Titer 1 Unknown  1:320   Antinuclear Antibodies (ANA) Latest Ref Range: Negative   Positive (A)   Pattern 1 (ANA) Unknown  Homogenous   ENA Screen Latest Ref Range: <0.70 ENA Units  0.50   Rheumatoid Factor Latest Ref Range: 0.0 - 15.0 [IU]/mL 26.9 (H)    CCP Antibodies Latest Ref Range: NEGATIVE  Positive    CCP IgG Antibodies Unknown 20

## 2020-12-24 NOTE — Unmapped (Addendum)
You were seen by [facility] today.      [facility]  [address]rd [address]   [place], [place] [address]  Phone:  [phone]  Fax:  [phone]     Rheumatologist:  Dr. Ilsa Iha       Summary of Plan for 12/24/20    Diagnostic testing recommendations:  Labs today (in clinic or on the first floor of the [employer])    Therapeutic / Treatment recommendations:   No change in medications today. Please continue your current treatment regimen. Please notify our clinic if you need refills prior to your next appointment    Referrals and follow-up recommendations:  Please follow-up with PCP    When should I expect results?   Please note that it may take up to 21 days for results to return.  If you have not been notified by phone, mail or [facility] (if applicable) in 21 days, please contact our office at [phone] or via [facility] messaging [email or web address])     What do I do if I have questions or concerns after the visit?   If you have non-urgent questions, the best way to contact your provider is to send a MyChart message by visiting [email or web address].  You can also use MyChart to request refills and access test results.  Providers will do their best to respond within 2-3 business days, although it may take longer in some circumstances.  If you have immediate concerns, please contact our clinic by phone  at [phone].     Thank you for allowing [facility] to be involved in your care!     Patient Education        Influenza (Flu) Vaccine (Inactivated or Recombinant): What You Need to Know  Why get vaccinated?  Influenza vaccine can prevent influenza (flu).  Flu is a contagious disease that spreads around the [place] every year, usually between October and May. Anyone can get the flu, but it is more dangerous for some people. Infants and young children, people 24 years and older, pregnant people, and people with certain health conditions or a weakened immune system are at greatest risk of flu complications.  Pneumonia, bronchitis, sinus infections, and ear infections are examples of flu-related complications. If you have a medical condition, such as heart disease, cancer, or diabetes, flu can make it worse.  Flu can cause fever and chills, sore throat, muscle aches, fatigue, cough, headache, and runny or stuffy nose. Some people may have vomiting and diarrhea, though this is more common in children than adults.  Each year, thousands of people in the [place] die from flu, and many more are hospitalized. Flu vaccine prevents millions of illnesses and flu-related visits to the doctor each year.  Influenza vaccines  CDC recommends everyone 6 months and older get vaccinated every flu season. Children 6 months through 25 years of age may need 2 doses during a single flu season. Everyone else needs only 1 dose each flu season.  It takes about 2 weeks for protection to develop after vaccination.  There are many flu viruses, and they are always changing. Each year a new flu vaccine is made to protect against the influenza viruses believed to be likely to cause disease in the upcoming flu season. Even when the vaccine doesn't exactly match these viruses, it may still provide some protection.  Influenza vaccine does not cause flu.  Influenza vaccine may be given at the same  time as other vaccines.  Talk with your health care provider  Tell your vaccination provider if the person getting the vaccine:  ?? Has had an allergic reaction after a previous dose of influenza vaccine, or has any severe, life-threatening allergies  ?? Has ever had Guillain-Barr?? Syndrome (also called GBS)  In some cases, your health care provider may decide to postpone influenza vaccination until a future visit.  Influenza vaccine can be administered at any time during pregnancy. People who are or will be pregnant during influenza season should receive inactivated influenza vaccine.  People with minor illnesses, such as a cold, may be vaccinated. People who are moderately or severely ill should usually wait until they recover before getting influenza vaccine.  Your health care provider can give you more information.  Risks of a vaccine reaction  ?? Soreness, redness, and swelling where the shot is given, fever, muscle aches, and headache can happen after influenza vaccination.  ?? There may be a very small increased risk of Guillain-Barr?? Syndrome (GBS) after inactivated influenza vaccine (the flu shot).  Young children who get the flu shot along with pneumococcal vaccine (PCV13) and/or DTaP vaccine at the same time might be slightly more likely to have a seizure caused by fever. Tell your health care provider if a child who is getting flu vaccine has ever had a seizure.  People sometimes faint after medical procedures, including vaccination. Tell your provider if you feel dizzy or have vision changes or ringing in the ears.  As with any medicine, there is a very remote chance of a vaccine causing a severe allergic reaction, other serious injury, or death.  What if there is a serious problem?  An allergic reaction could occur after the vaccinated person leaves the clinic. If you see signs of a severe allergic reaction (hives, swelling of the face and throat, difficulty breathing, a fast heartbeat, dizziness, or weakness), call 9-1-1 and get the person to the nearest hospital.  For other signs that concern you, call your health care provider.  Adverse reactions should be reported to the Vaccine Adverse Event Reporting System (VAERS). Your health care provider will usually file this report, or you can do it yourself. Visit the VAERS website at www.vaers.LAgents.no or call 6064852173. VAERS is only for reporting reactions, and VAERS staff members do not give medical advice.  The National Vaccine Injury Compensation Program  The National Vaccine Injury Compensation Program (VICP) is a federal program that was created to compensate people who may have been injured by certain vaccines. Claims regarding alleged injury or death due to vaccination have a time limit for filing, which may be as short as two years. Visit the VICP website at SpiritualWord.at or call (302)633-6201 to learn about the program and about filing a claim.  How can I learn more?  ?? Ask your health care provider.  ?? Call your local or state health department.  ?? Visit the website of the Food and Drug Administration (FDA) for vaccine package inserts and additional information at FinderList.no.  ?? Contact the Centers for Disease Control and Prevention (CDC):  ? Call (651)441-5675 (1-800-CDC-INFO) or  ? Visit CDC's website at BiotechRoom.com.cy.  Vaccine Information Statement  Inactivated Influenza Vaccine  07/26/2020  42 U.S.C. ?? [phone]  Department of Health and Insurance risk surveyor for Disease Control and Prevention  Many vaccine information statements are available in Spanish and other languages. See PromoAge.com.br.  Hojas de informaci??n sobre vacunas est??n disponibles en espa??ol y en muchos  otros idiomas. Visite PromoAge.com.br.  Care instructions adapted under license by [facility]. If you have questions about a medical condition or this instruction, always ask your healthcare professional. [employer] disclaims any warranty or liability for your use of this information.

## 2020-12-25 NOTE — Unmapped (Signed)
The [facility] has received the prescription(s) for Enbrel Sureclick. The triage team has completed the benefits investigation and has determined that the patient is NOT able to fill this medication at the [facility] due to insurance plan limitations. Please see additional information below and re-route the prescription to the preferred pharmacy. Thank you.    PA Required: Unable to Determine    Specialty Pharmacy Required:  [facility] - Phone: [phone] and Fax: [phone]

## 2020-12-26 ENCOUNTER — Ambulatory Visit: Payer: BC Managed Care – PPO | Admitting: Family Medicine

## 2020-12-26 DIAGNOSIS — M059 Rheumatoid arthritis with rheumatoid factor, unspecified: Principal | ICD-10-CM

## 2020-12-26 MED ORDER — ETANERCEPT 50 MG/ML (1 ML) SUBCUTANEOUS PEN INJECTOR
SUBCUTANEOUS | 3 refills | 84 days | Status: CP
Start: 2020-12-26 — End: ?

## 2020-12-26 NOTE — Unmapped (Signed)
Ms. Yvonne Gray prescription for Enbrel has been forwarded to [facility] as per insurance requirement. Patient notified via [employer].    Chelsea Aus

## 2020-12-26 NOTE — Unmapped (Signed)
This patient has been disenrolled from the [facility] specialty pharmacy services due to a pharmacy change resulting from insurance limitations. The insurance company requires the patient fill at [facility].    Yvonne Gray  [facility] [employer] Specialty Pharmacist

## 2021-01-01 DIAGNOSIS — M059 Rheumatoid arthritis with rheumatoid factor, unspecified: Principal | ICD-10-CM

## 2021-01-07 ENCOUNTER — Other Ambulatory Visit: Payer: BC Managed Care – PPO

## 2021-01-09 ENCOUNTER — Ambulatory Visit: Payer: BC Managed Care – PPO | Admitting: Family Medicine

## 2021-01-14 DIAGNOSIS — M059 Rheumatoid arthritis with rheumatoid factor, unspecified: Principal | ICD-10-CM

## 2021-01-16 MED ORDER — NAPROXEN 500 MG TABLET
ORAL_TABLET | Freq: Two times a day (BID) | ORAL | 1 refills | 30.00000 days | Status: CP | PRN
Start: 2021-01-16 — End: ?

## 2021-01-23 ENCOUNTER — Ambulatory Visit: Payer: BC Managed Care – PPO | Admitting: Family Medicine

## 2021-01-23 ENCOUNTER — Encounter: Payer: Self-pay | Admitting: Family Medicine

## 2021-01-23 ENCOUNTER — Other Ambulatory Visit: Payer: Self-pay

## 2021-01-23 VITALS — BP 115/70 | HR 80 | Temp 97.7°F | Resp 16 | Ht 67.0 in | Wt 243.6 lb

## 2021-01-23 DIAGNOSIS — E1169 Type 2 diabetes mellitus with other specified complication: Secondary | ICD-10-CM

## 2021-01-23 DIAGNOSIS — Z23 Encounter for immunization: Secondary | ICD-10-CM

## 2021-01-23 DIAGNOSIS — Z6838 Body mass index (BMI) 38.0-38.9, adult: Secondary | ICD-10-CM

## 2021-01-23 DIAGNOSIS — Z1211 Encounter for screening for malignant neoplasm of colon: Secondary | ICD-10-CM

## 2021-01-23 DIAGNOSIS — E1165 Type 2 diabetes mellitus with hyperglycemia: Secondary | ICD-10-CM | POA: Diagnosis not present

## 2021-01-23 DIAGNOSIS — E785 Hyperlipidemia, unspecified: Secondary | ICD-10-CM

## 2021-01-23 DIAGNOSIS — D84821 Immunodeficiency due to drugs: Secondary | ICD-10-CM

## 2021-01-23 DIAGNOSIS — IMO0002 Reserved for concepts with insufficient information to code with codable children: Secondary | ICD-10-CM

## 2021-01-23 DIAGNOSIS — R079 Chest pain, unspecified: Secondary | ICD-10-CM

## 2021-01-23 DIAGNOSIS — J4530 Mild persistent asthma, uncomplicated: Secondary | ICD-10-CM | POA: Diagnosis not present

## 2021-01-23 DIAGNOSIS — Z91199 Patient's noncompliance with other medical treatment and regimen due to unspecified reason: Secondary | ICD-10-CM

## 2021-01-23 DIAGNOSIS — M069 Rheumatoid arthritis, unspecified: Secondary | ICD-10-CM

## 2021-01-23 DIAGNOSIS — Z8719 Personal history of other diseases of the digestive system: Secondary | ICD-10-CM

## 2021-01-23 DIAGNOSIS — Z794 Long term (current) use of insulin: Secondary | ICD-10-CM

## 2021-01-23 DIAGNOSIS — Z79899 Other long term (current) drug therapy: Secondary | ICD-10-CM

## 2021-01-23 DIAGNOSIS — Z1231 Encounter for screening mammogram for malignant neoplasm of breast: Secondary | ICD-10-CM

## 2021-01-23 DIAGNOSIS — E039 Hypothyroidism, unspecified: Secondary | ICD-10-CM | POA: Diagnosis not present

## 2021-01-23 DIAGNOSIS — Z9119 Patient's noncompliance with other medical treatment and regimen: Secondary | ICD-10-CM | POA: Insufficient documentation

## 2021-01-23 DIAGNOSIS — Z5181 Encounter for therapeutic drug level monitoring: Secondary | ICD-10-CM

## 2021-01-23 DIAGNOSIS — E66812 Obesity, class 2: Secondary | ICD-10-CM

## 2021-01-23 NOTE — Progress Notes (Signed)
   01/23/21 1847  Asthma History  Symptoms 0-2 days/week  Nighttime Awakenings 0-2/month  Asthma interference with normal activity Minor limitations  SABA use (not for EIB) 0-2 days/wk  Risk: Exacerbations requiring oral systemic steroids 2 or more / year  Asthma Severity Mild Persistent

## 2021-01-23 NOTE — Patient Instructions (Signed)
Health Maintenance  Topic Date Due  . Complete foot exam   01/27/2020  . Urine Protein Check  01/27/2020  . Hemoglobin A1C  06/26/2020  . Eye exam for diabetics  03/20/2021*  . Colon Cancer Screening  01/23/2022*  . COVID-19 Vaccine (4 - Booster for Pfizer series) 02/05/2021  . Pap Smear  12/27/2022  . Tetanus Vaccine  01/23/2031  . Flu Shot  Completed  . Pneumococcal vaccine  Completed  .  Hepatitis C: One time screening is recommended by Center for Disease Control  (CDC) for  adults born from [birth year] through 1967.   Completed  . HIV Screening  Completed  *Topic was postponed. The date shown is not the original due date.    Lab Results  Component Value Date   HGBA1C 8.1 (H) 12/28/2019   HGBA1C 10.1 (H) 06/20/2019   HGBA1C 9.5 (H) 12/08/2018   We need to catch up on labs, getting things controlled and doing all standard of care and health maintenance for you and for your current diagnosis.

## 2021-01-23 NOTE — Progress Notes (Signed)
Name: Ashley Cochran   MRN: [id number]    DOB: [birth year]   Date:01/23/2021       Progress Note  Chief Complaint  Patient presents with  . Diabetes  . Hypothyroidism  . Asthma    Follow up     Subjective:   Ashley Cochran is a 45 y.o. female, presents to clinic for routine f/up on severe uncontrolled chronic conditions, pt has been non-compliant with f/up and with meds  Pt still new to me - though I am documented at PCP for some time -she did 1 virtual visit last year in July and has not come in person to establish care and to address chronic conditions Last in person and office visit was with her last PCP Maurice Small over a year ago January 2021 She saw Dr. Dorris Fetch a few times for acute issues with back pain or refills for asthma medications with exacerbation  She is here to follow-up on that and other uncontrolled chronic conditions   Asthma- meds recently refilled with OV with Dr. Rexene Edison - here for f/up On singulair, symbicort, albuterol rescue inhaler She reports using albuterol only 1 time a week on average she is not having any nighttime waking or worsening asthma symptoms she has some minor limitations with normal activity throughout the days and week especially with more strenuous physical work at her job in the past year she has had about 2-3 asthma exacerbations requiring steroids but this was secondary to Covid and other year she has not required as many systemic steroid treatments  IDDM, uncontrolled not on insulin currently due to cost?noncompliance DM:   Only on jardiance -she previously reported to my colleague that her blood sugars were in excess of 200-500 brought down a significant amount by taking Jardiance she was previously on Victoza, [place] and previously could not tolerate any forms of Metformin at all. She brings in a picture of a medication asks about possibly trying [place] Blood sugars very elevated not checking very often She endorses  polyuria, polydipsia, unintentional weight loss, neuropathy Last A1c January of last year patient was on all of her medications For most of the last year she has not been on Victoza or [place] Recent pertinent labs: Lab Results  Component Value Date   HGBA1C 8.1 (H) 12/28/2019   HGBA1C 10.1 (H) 06/20/2019   HGBA1C 9.5 (H) 12/08/2018   Standard of care and health maintenance: Urine Microalbumin:  due Foot exam:  due DM eye exam:  due ACEI/ARB:  Not taking Statin:  Not taking  She states that she has never been put on an ACE or an ARB for renal protection and has never taken a statin and additionally states she has never had trouble with her blood pressure or cholesterol  Hypothyroid- on levothyroxine 175 mcg daily Lab Results  Component Value Date   TSH 3.07 12/28/2019   Obesity - weight down  Wt Readings from Last 5 Encounters:  01/23/21 243 lb 9.6 oz (110.5 kg)  08/19/20 249 lb 9.6 oz (113.2 kg)  06/21/20 250 lb (113.4 kg)  05/17/20 250 lb (113.4 kg)  12/28/19 252 lb 1.6 oz (114.4 kg)   BMI Readings from Last 5 Encounters:  01/23/21 38.15 kg/m  08/19/20 39.09 kg/m  06/21/20 39.16 kg/m  05/17/20 40.35 kg/m  12/28/19 40.69 kg/m   GERD - severe, needs PPI every day, severe sx if she doesn't take  Chronic back pain she has previously been prescribed baclofen, Flexeril, she uses naproxen, and  there is also tizanidine on the chart  History of insomnia previously using trazodone she states she is currently not taking this  Also in the chart there is Compazine, Phenergan, Prilosec and ondansetron  She has history of RA is seeing a specialist at [facility] and is on Enbrel injections    Current Outpatient Medications:  .  albuterol (PROVENTIL) (2.5 MG/3ML) 0.083% nebulizer solution, Take 3 mLs (2.5 mg total) by nebulization every 6 (six) hours as needed for wheezing or shortness of breath., Disp: 100 mL, Rfl: 1 .  albuterol (VENTOLIN HFA) 108 (90 Base) MCG/ACT inhaler,  Inhale 2 puffs into the lungs every 6 (six) hours as needed for wheezing or shortness of breath., Disp: 6.7 g, Rfl: 1 .  baclofen (LIORESAL) 20 MG tablet, Take by mouth., Disp: , Rfl:  .  budesonide-formoterol (SYMBICORT) 80-4.5 MCG/ACT inhaler, Inhale 2 puffs into the lungs 2 (two) times daily., Disp: 6.9 g, Rfl: 1 .  cyclobenzaprine (FLEXERIL) 5 MG tablet, cyclobenzaprine 5 mg tablet  Take 1 tablet as needed by oral route at bedtime for 7 days., Disp: , Rfl:  .  empagliflozin (JARDIANCE) 25 MG TABS tablet, Take 1 tablet (25 mg total) by mouth daily., Disp: 90 tablet, Rfl: 0 .  ENBREL SURECLICK 50 MG/ML injection, Inject into the skin., Disp: , Rfl:  .  fluticasone (FLONASE) 50 MCG/ACT nasal spray, Place 2 sprays into both nostrils daily., Disp: 16 g, Rfl: 6 .  levothyroxine (SYNTHROID) 175 MCG tablet, TAKE ONE TABLET BY MOUTH EVERY DAY, Disp: 30 tablet, Rfl: 0 .  montelukast (SINGULAIR) 10 MG tablet, TAKE ONE TABLET BY MOUTH ONCE DAILY FOR ASTHMA, Disp: 90 tablet, Rfl: 3 .  naproxen (NAPROSYN) 500 MG tablet, naproxen 500 mg tablet  Take 1 tablet twice a day by oral route., Disp: , Rfl:  .  omeprazole (PRILOSEC) 20 MG capsule, Take 1 capsule (20 mg total) by mouth daily., Disp: 90 capsule, Rfl: 0 .  ondansetron (ZOFRAN) 4 MG tablet, Take 1 tablet (4 mg total) by mouth 2 (two) times daily., Disp: 20 tablet, Rfl: 1 .  OVER THE COUNTER MEDICATION, Apply 1 application topically daily. Triderma MD, Disp: , Rfl:  .  prochlorperazine (COMPAZINE) 10 MG tablet, Take 1 tablet (10 mg total) by mouth every 6 (six) hours as needed for nausea or vomiting., Disp: 12 tablet, Rfl: 0 .  promethazine (PHENERGAN) 12.5 MG tablet, Take 1 tablet (12.5 mg total) by mouth every 8 (eight) hours as needed for nausea or vomiting. *DO NOT TAKE WITH Promethazine-DM cough syrup*, Disp: 10 tablet, Rfl: 0 .  tiZANidine (ZANAFLEX) 4 MG tablet, Take 1 tablet (4 mg total) by mouth every 8 (eight) hours as needed for muscle spasms.,  Disp: 30 tablet, Rfl: 0 .  traZODone (DESYREL) 50 MG tablet, Take 1 tablet (50 mg total) by mouth at bedtime. (Patient not taking: Reported on 01/23/2021), Disp: 90 tablet, Rfl: 4  Patient Active Problem List   Diagnosis Date Noted  . Acute bilateral low back pain without sciatica 08/19/2020  . Asthma with acute exacerbation 06/21/2020  . H/O gastroesophageal reflux (GERD) 06/21/2020  . Class 2 severe obesity with serious comorbidity and body mass index (BMI) of 38.0 to 38.9 in adult (HCC) 01/26/2019  . Uncontrolled type 2 diabetes mellitus with hyperglycemia (HCC) 01/26/2019  . Dyslipidemia associated with type 2 diabetes mellitus (HCC) 01/26/2019  . Hypothyroidism 04/09/2015  . Rheumatoid arthritis (HCC) 04/09/2015    No past surgical history on file.  Family History  Problem Relation Age of Onset  . Hypertension Mother   . Diabetes Mother        type 2  . Asthma Mother   . Stroke Mother        Mini strokes  . GER disease Mother   . Hypertension Father   . Diabetes Father        Type 2  . Asthma Father   . GER disease Father   . Asthma Sister   . Allergies Sister   . Asthma Brother   . Allergies Brother     Social History   Tobacco Use  . Smoking status: Former Smoker    Quit date: 09/20/2004    Years since quitting: 16.3  . Smokeless tobacco: Never Used  Vaping Use  . Vaping Use: Never used  Substance Use Topics  . Alcohol use: No  . Drug use: No     Allergies  Allergen Reactions  . Nitrofuran Derivatives Rash    jittery  . Latex   . Ranitidine Hcl      Causes jitters  . Strawberry Flavor   . Tape Rash    Health Maintenance  Topic Date Due  . FOOT EXAM  01/27/2020  . URINE MICROALBUMIN  01/27/2020  . HEMOGLOBIN A1C  06/26/2020  . OPHTHALMOLOGY EXAM  03/20/2021 (Originally 12/07/1984)  . COLONOSCOPY (Pts 45-31yrs Insurance coverage will need to be confirmed)  01/23/2022 (Originally 12/08/2019)  . COVID-19 Vaccine (4 - Booster for Pfizer series)  02/05/2021  . PAP SMEAR-Modifier  12/27/2022  . TETANUS/TDAP  01/23/2031  . INFLUENZA VACCINE  Completed  . PNEUMOCOCCAL POLYSACCHARIDE VACCINE AGE 59-64 HIGH RISK  Completed  . Hepatitis C Screening  Completed  . HIV Screening  Completed    Chart Review Today: I personally reviewed active problem list, medication list, allergies, family history, social history, health maintenance, notes from last encounter, lab results, imaging with the patient/caregiver today.   Review of Systems  Constitutional: Negative.   HENT: Negative.   Eyes: Negative.   Respiratory: Negative.   Cardiovascular: Negative.   Gastrointestinal: Negative.   Endocrine: Negative.   Genitourinary: Negative.   Musculoskeletal: Negative.   Skin: Negative.   Allergic/Immunologic: Negative.   Neurological: Negative.   Hematological: Negative.   Psychiatric/Behavioral: Negative.   All other systems reviewed and are negative.  ROS neg other than specified as acute pertinent ROS in HPI  Objective:   Vitals:   01/23/21 0854  BP: 115/70  Pulse: 80  Resp: 16  Temp: 97.7 F (36.5 C)  TempSrc: Oral  SpO2: 97%  Weight: 243 lb 9.6 oz (110.5 kg)  Height: 5\' 7"  (1.702 m)    Body mass index is 38.15 kg/m.  Physical Exam Vitals and nursing note reviewed.  Constitutional:      General: She is not in acute distress.    Appearance: Normal appearance. She is well-developed. She is morbidly obese. She is not ill-appearing, toxic-appearing or diaphoretic.     Interventions: Face mask in place.  HENT:     Head: Normocephalic and atraumatic.     Right Ear: External ear normal.     Left Ear: External ear normal.  Eyes:     General: Lids are normal. No scleral icterus.       Right eye: No discharge.        Left eye: No discharge.     Conjunctiva/sclera: Conjunctivae normal.  Neck:     Trachea: Phonation normal. No tracheal deviation.  Cardiovascular:  Rate and Rhythm: Normal rate and regular rhythm.      Pulses: Normal pulses.          Radial pulses are 2+ on the right side and 2+ on the left side.       Posterior tibial pulses are 2+ on the right side and 2+ on the left side.     Heart sounds: Normal heart sounds. No murmur heard. No friction rub. No gallop.   Pulmonary:     Effort: Pulmonary effort is normal. No respiratory distress.     Breath sounds: Normal breath sounds. No stridor. No wheezing, rhonchi or rales.  Chest:     Chest wall: No tenderness.  Abdominal:     General: Bowel sounds are normal. There is no distension.     Palpations: Abdomen is soft.  Musculoskeletal:     Right lower leg: No edema.     Left lower leg: No edema.  Skin:    General: Skin is warm and dry.     Coloration: Skin is not jaundiced or pale.     Findings: No rash.  Neurological:     Mental Status: She is alert.     Motor: No abnormal muscle tone.     Gait: Gait normal.  Psychiatric:        Mood and Affect: Mood normal.        Speech: Speech normal.        Behavior: Behavior normal. Behavior is cooperative.     Diabetic Foot Exam - Simple   Simple Foot Form Visual Inspection No deformities, no ulcerations, no other skin breakdown bilaterally: Yes Sensation Testing Intact to touch and monofilament testing bilaterally: Yes Pulse Check Posterior Tibialis and Dorsalis pulse intact bilaterally: Yes Comments No wounds some paresthesias -did inspection and brief palpation and she has normal sensation to light touch but did not do a full detailed diabetic foot exam today we will do when she returns -we will still need to do monofilament testing at that time        Assessment & Plan:   1. Uncontrolled type 2 diabetes mellitus with hyperglycemia (HCC) Uncontrolled patient noncompliant with medications and with follow-up office visits Previously on Victoza, long-acting insulin and Jardiance She is intolerant of Metformin Blood sugars have been elevated 300+ She does not have a meter and is  sharing strips with other family members She was given a Freestyle libre to meter sample today She would likely be severely uncontrolled and expect A1c to be very elevated She was given a sample of long-acting insulin pen today Depending on her A1c will likely need to get back on long-acting insulin, I explained that [place] and Jardiance are in the same category, we could possibly restart GLP-1 inhibitor or an injection which would be good for weight loss obesity and since she does not tolerate Metformin may help with insulin resistance, weight loss etc. She tolerated Victoza well in the past Ability to get patient medications and get glycemic control will likely be limited by insurance and med cost  I did explain that we will need to do a foot exam her eye exam and get several things addressed and updated but because she is new to me, she has been so uncontrolled because were going to have to add and change medications we will get labs today reviewed them start some medications and have her follow-up very closely in clinic in the next month and will try to do some of the health  maintenance gaps at that time  - COMPLETE METABOLIC PANEL WITH GFR - Hemoglobin A1c - Microalbumin, urine - Ambulatory referral to Ophthalmology  2. Mild intermittent asthma without complication Currently well controlled   01/23/21 1847  Asthma History  Symptoms 0-2 days/week  Nighttime Awakenings 0-2/month  Asthma interference with normal activity Minor limitations  SABA use (not for EIB) 0-2 days/wk  Risk: Exacerbations requiring oral systemic steroids 2 or more / year  Asthma Severity Mild Persistent   Lungs clear Her recent exacerbations requiring oral systemic steroids was likely worse than her normal due to Covid and prolonged symptoms Currently doing well with controlling allergies, using Singulair Symbicort as maintenance inhaler and albuterol rescue inhaler or nebulizers  3. Hypothyroidism,  unspecified type She endorses taking her levothyroxine daily and not running out she does have weight changes this is likely due to uncontrolled DM due for repeat of labs will adjust levothyroxine dosing per lab results - CBC with Differential/Platelet - COMPLETE METABOLIC PANEL WITH GFR - TSH  4. Dyslipidemia associated with type 2 diabetes mellitus (HCC) She has history of dyslipidemia is not on a statin and which I discussed with her her increased risk of heart attack and stroke with her uncontrolled diabetes -I explained that a statin is indicated as standard of care and to reduce her risk  We will likely send in a statin once her labs are reviewed - COMPLETE METABOLIC PANEL WITH GFR - Lipid panel  5. Rheumatoid arthritis, involving unspecified site, unspecified whether rheumatoid factor present (HCC) On Enbrel managed by rheumatology at [facility] Left rheumatology office visit reviewed through [facility] care everywhere -diagnosis of seropositive RA, encounter for etanercept therapy on Enbrel 50 mg q. weekly RA symptoms are well controlled without significant recent flares using NSAIDs and Voltaren gel OTC They advised following up with PCP with intermittent chest pain stating that RA is risk factor for early CAD Patient has not been evaluated here for chest pain or discussed these risks and she is not on ACE, aspirin or statin Healthcare Maintenance updated by rheumatology and copied below - Immunizations: Due for flu shot. Amenable to getting today. UTD on COVID vaccine (boosted 8/16). Immunization History  Administered Date(s) Administered  . COVID-19 VACC,MRNA,(PFIZER)(PF)(IM) 03/25/2020, 04/15/2020, 08/05/2020  . Influenza Vaccine Quad (IIV4 PF) 47mo+ injectable 09/21/2016, 02/21/2018, 10/16/2019  . PNEUMOCOCCAL POLYSACCHARIDE 23 08/09/2017  . Pneumococcal Conjugate 13-Valent 12/30/2015   6. Class 2 severe obesity with serious comorbidity and body mass index (BMI) of 38.0 to 38.9 in adult,  unspecified obesity type (HCC) Morbid obesity with multiple uncontrolled chronic conditions and comorbidities including insulin-dependent diabetes that is uncontrolled, uncontrolled hyperlipidemia, asthma, autoimmune disease, hypothyroid  7. Need for tetanus booster - Tdap vaccine greater than or equal to 7yo IM  8. Encounter for medication monitoring - CBC with Differential/Platelet - COMPLETE METABOLIC PANEL WITH GFR - Lipid panel - Hemoglobin A1c - TSH - Microalbumin, urine  9. H/O gastroesophageal reflux (GERD) GERD/GI sx often, unable to stop PPI w/o severe sx, refer to GI for further f/up - diabetic gastroparesis may also be contributory  10. Insulin dependent type 2 diabetes mellitus, uncontrolled (HCC) Off insulin, uncontrolled Previously referred to Endocrinology - Lucianne Muss - not seeing specialists  11. Chest pain, unspecified type reported to rheumatology who recommended PCP and cardiology f/up with high risk due to RA - further with IDDM uncontrolled not on statin  12. Patient's noncompliance with other medical treatment and regimen off meds, not coming in for f/up, multiple recommendations  and referrals in chart pt did not do or follow   Health Maintenance  Topic Date Due  . FOOT EXAM  01/27/2020  . URINE MICROALBUMIN  01/27/2020  . HEMOGLOBIN A1C  06/26/2020  . OPHTHALMOLOGY EXAM  03/20/2021 (Originally 12/07/1984)  . COLONOSCOPY (Pts 45-67yrs Insurance coverage will need to be confirmed)  01/23/2022 (Originally 12/08/2019)  . COVID-19 Vaccine (4 - Booster for Pfizer series) 02/05/2021  . PAP SMEAR-Modifier  12/27/2022  . TETANUS/TDAP  01/23/2031  . INFLUENZA VACCINE  Completed  . PNEUMOCOCCAL POLYSACCHARIDE VACCINE AGE 63-64 HIGH RISK  Completed  . Hepatitis C Screening  Completed  . HIV Screening  Completed    Health Maintenance reviewed - urine microalbumin ordered, tetanus booster given, patient asked to schedule diabetic eye exam, glycohemoglobin  ordered.;  13. Encounter for screening mammogram for malignant neoplasm of breast  Z12.31 MM 3D SCREEN BREAST BILATERAL  14. Screening for malignant neoplasm of colon  Z12.11 Ambulatory referral to Gastroenterology  15. Immunocompromised state due to drug therapy [facility])  [phone] Immunizations and screening reviewed per rheumatology [facility] care everywhere   [phone]     meds will be sent in once labs resulted GLP1, basal insulin, statin, ACEI/ARB  May need cardiology referral - will discus further with her with close f/up visits  Given freestyle libre 2 meter sample - to bring in her glucometer and data on phone/app with next visit Will help get her controlled if she is able to do some CGM  Time-based billing, level 5 spent more than 30 minutes with the patient in clinic today, spent more than 15 minutes reviewing chart today the day of encounter including past office visits in the same clinic over the past 1 to 2 years, past referrals, immunizations labs and specialist documentation and recommendation through care everywhere documentation Spent more than 10 minutes additionally to chart and arrange care and orders today Sample of continuous glucose monitor and long-acting insulin given to the patient today High risk, non-compliant, uncontrolled pt, with cardiac risk, uncontrolled insulin-dependent diabetes, immunocompromised, with multiple additional comorbidities.  Thoroughly reviewed and updated chart today  I did explain to the patient that we will need to get blood work restart medications she will need to come in person with all of her medicines in the next 1 to 2 months and will likely need to be seen several times closely in order to get everything controlled and then we can space out routine follow-up visits.  I explained that I cannot help her if she will not come in for visits and I cannot help her if she does not communicate with me when she is unable to afford medications or insurance  does not cover her medications.    01/23/21 9:05 AM Danelle Berry, PA-C

## 2021-01-24 LAB — CBC WITH DIFFERENTIAL/PLATELET
Absolute Monocytes: 542 cells/uL (ref 200–950)
Basophils Absolute: 76 cells/uL (ref 0–200)
Basophils Relative: 0.8 %
Eosinophils Absolute: 247 cells/uL (ref 15–500)
Eosinophils Relative: 2.6 %
HCT: 42.9 % (ref 35.0–45.0)
Hemoglobin: 14.2 g/dL (ref 11.7–15.5)
Lymphs Abs: 3876 cells/uL (ref 850–3900)
MCH: 27.4 pg (ref 27.0–33.0)
MCHC: 33.1 g/dL (ref 32.0–36.0)
MCV: 82.8 fL (ref 80.0–100.0)
MPV: 10.3 fL (ref 7.5–12.5)
Monocytes Relative: 5.7 %
Neutro Abs: 4760 cells/uL (ref 1500–7800)
Neutrophils Relative %: 50.1 %
Platelets: 260 10*3/uL (ref 140–400)
RBC: 5.18 10*6/uL — ABNORMAL HIGH (ref 3.80–5.10)
RDW: 13.4 % (ref 11.0–15.0)
Total Lymphocyte: 40.8 %
WBC: 9.5 10*3/uL (ref 3.8–10.8)

## 2021-01-24 LAB — HEMOGLOBIN A1C
Hgb A1c MFr Bld: 10.4 % of total Hgb — ABNORMAL HIGH (ref <5.7)
Mean Plasma Glucose: 252 mg/dL
eAG (mmol/L): 13.9 mmol/L

## 2021-01-24 LAB — COMPLETE METABOLIC PANEL WITH GFR
AG Ratio: 1 (calc) (ref 1.0–2.5)
ALT: 28 U/L (ref 6–29)
AST: 28 U/L (ref 10–35)
Albumin: 3.8 g/dL (ref 3.6–5.1)
Alkaline phosphatase (APISO): 125 U/L (ref 31–125)
BUN: 14 mg/dL (ref 7–25)
CO2: 24 mmol/L (ref 20–32)
Calcium: 9.3 mg/dL (ref 8.6–10.2)
Chloride: 102 mmol/L (ref 98–110)
Creat: 0.59 mg/dL (ref 0.50–1.10)
GFR, Est African American: 127 mL/min/{1.73_m2} (ref >=60)
GFR, Est Non African American: 110 mL/min/{1.73_m2} (ref >=60)
Globulin: 3.8 g/dL (calc) — ABNORMAL HIGH (ref 1.9–3.7)
Glucose, Bld: 175 mg/dL — ABNORMAL HIGH (ref 65–99)
Potassium: 4.5 mmol/L (ref 3.5–5.3)
Sodium: 137 mmol/L (ref 135–146)
Total Bilirubin: 0.6 mg/dL (ref 0.2–1.2)
Total Protein: 7.6 g/dL (ref 6.1–8.1)

## 2021-01-24 LAB — TSH: TSH: 1.68 mIU/L

## 2021-01-24 LAB — LIPID PANEL
Cholesterol: 165 mg/dL (ref <200)
HDL: 46 mg/dL — ABNORMAL LOW (ref >=50)
LDL Cholesterol (Calc): 97 mg/dL (calc)
Non-HDL Cholesterol (Calc): 119 mg/dL (calc) (ref <130)
Total CHOL/HDL Ratio: 3.6 (calc) (ref <5.0)
Triglycerides: 122 mg/dL (ref <150)

## 2021-01-24 LAB — MICROALBUMIN, URINE: Microalb, Ur: <0.2 mg/dL

## 2021-01-27 ENCOUNTER — Other Ambulatory Visit: Payer: Self-pay | Admitting: Family Medicine

## 2021-01-27 DIAGNOSIS — E1165 Type 2 diabetes mellitus with hyperglycemia: Secondary | ICD-10-CM

## 2021-01-27 DIAGNOSIS — E039 Hypothyroidism, unspecified: Secondary | ICD-10-CM

## 2021-01-27 DIAGNOSIS — Z8719 Personal history of other diseases of the digestive system: Secondary | ICD-10-CM

## 2021-01-27 MED ORDER — LEVOTHYROXINE SODIUM 175 MCG PO TABS: 175.0000 ug | ORAL_TABLET | Freq: Every day | ORAL | 1 refills | Status: DC

## 2021-01-27 MED ORDER — OMEPRAZOLE 20 MG PO CPDR
20.0000 mg | DELAYED_RELEASE_CAPSULE | Freq: Every day | ORAL | 3 refills | Status: DC
Start: 2021-01-27 — End: 2021-07-03

## 2021-01-27 MED ORDER — ATORVASTATIN CALCIUM 10 MG PO TABS
10.0000 mg | ORAL_TABLET | Freq: Every day | ORAL | 1 refills | Status: DC
Start: 2021-01-27 — End: 2021-02-11

## 2021-01-27 MED ORDER — LISINOPRIL 2.5 MG PO TABS
2.5000 mg | ORAL_TABLET | Freq: Every day | ORAL | 1 refills | Status: DC
Start: 2021-01-27 — End: 2021-02-11

## 2021-02-07 ENCOUNTER — Telehealth: Payer: Self-pay | Admitting: Family Medicine

## 2021-02-07 NOTE — Telephone Encounter (Signed)
Pt does not know the name of her Rx that she needs her new Rx for diabetes (a shot and a pill she says)  [facility] [address] [place] - [address]  [address] [place] [address]  Phone: [phone] Fax: [phone]   Best contact: [phone]

## 2021-02-10 NOTE — Telephone Encounter (Signed)
No vm setup, for the DM she should have 1 more refill on the jardiance(pill) and I dont see on her current med list where she is on an injectable?

## 2021-02-11 ENCOUNTER — Other Ambulatory Visit: Payer: Self-pay

## 2021-02-11 DIAGNOSIS — E039 Hypothyroidism, unspecified: Secondary | ICD-10-CM

## 2021-02-11 DIAGNOSIS — E1165 Type 2 diabetes mellitus with hyperglycemia: Secondary | ICD-10-CM

## 2021-02-11 MED ORDER — LEVOTHYROXINE SODIUM 175 MCG PO TABS
175.0000 ug | ORAL_TABLET | Freq: Every day | ORAL | 1 refills | Status: DC
Start: 2021-02-11 — End: 2021-07-03

## 2021-02-11 MED ORDER — LISINOPRIL 2.5 MG PO TABS
2.5000 mg | ORAL_TABLET | Freq: Every day | ORAL | 1 refills | Status: DC
Start: 2021-02-11 — End: 2022-04-28

## 2021-02-11 MED ORDER — ATORVASTATIN CALCIUM 10 MG PO TABS: 10.0000 mg | ORAL_TABLET | Freq: Every day | ORAL | 1 refills | Status: DC

## 2021-02-11 NOTE — Telephone Encounter (Signed)
Per her lab note you were going to send her in insulin?  Please send to [facility]

## 2021-02-12 DIAGNOSIS — IMO0002 Reserved for concepts with insufficient information to code with codable children: Secondary | ICD-10-CM

## 2021-02-12 DIAGNOSIS — E1165 Type 2 diabetes mellitus with hyperglycemia: Secondary | ICD-10-CM

## 2021-02-12 NOTE — Telephone Encounter (Signed)
Jamie sent Mychart message to patient, was unable to reach by phone

## 2021-02-13 MED ORDER — TOUJEO SOLOSTAR 300 UNIT/ML ~~LOC~~ SOPN: 10.0000 [IU] | PEN_INJECTOR | Freq: Every day | SUBCUTANEOUS | 3 refills | Status: DC

## 2021-02-13 MED ORDER — INSULIN PEN NEEDLE 29G X 8MM MISC: 1 refills | Status: AC

## 2021-02-20 ENCOUNTER — Ambulatory Visit: Payer: BC Managed Care – PPO | Admitting: Family Medicine

## 2021-02-24 ENCOUNTER — Ambulatory Visit: Payer: BC Managed Care – PPO | Admitting: Family Medicine

## 2021-02-24 ENCOUNTER — Encounter: Payer: Self-pay | Admitting: Family Medicine

## 2021-02-24 ENCOUNTER — Other Ambulatory Visit: Payer: Self-pay

## 2021-02-24 VITALS — BP 122/78 | HR 83 | Temp 97.4°F | Resp 16 | Ht 65.0 in | Wt 246.4 lb

## 2021-02-24 DIAGNOSIS — J4530 Mild persistent asthma, uncomplicated: Secondary | ICD-10-CM

## 2021-02-24 DIAGNOSIS — E1165 Type 2 diabetes mellitus with hyperglycemia: Secondary | ICD-10-CM | POA: Diagnosis not present

## 2021-02-24 DIAGNOSIS — IMO0002 Reserved for concepts with insufficient information to code with codable children: Secondary | ICD-10-CM

## 2021-02-24 DIAGNOSIS — Z794 Long term (current) use of insulin: Secondary | ICD-10-CM | POA: Diagnosis not present

## 2021-02-24 MED ORDER — FREESTYLE LIBRE 2 SENSOR MISC: 1.0000 | 3 refills | Status: AC

## 2021-02-24 MED ORDER — SEMAGLUTIDE(0.25 OR 0.5MG/DOS) 2 MG/1.5ML ~~LOC~~ SOPN: 0.2500 mg | PEN_INJECTOR | SUBCUTANEOUS | 1 refills | Status: AC

## 2021-02-24 NOTE — Assessment & Plan Note (Signed)
Doing well on current regimen, no changes made today. 

## 2021-02-24 NOTE — Assessment & Plan Note (Signed)
Uncontrolled with CBGs still 170-340s, mainly in 200s. Will add semaglutide, continue jardiance and toujeo. New rx sent for CBG sensor. Foot exam wnl. Counseled on weight loss and diabetic diet today, see handout. F/u in 4 weeks.

## 2021-02-24 NOTE — Patient Instructions (Addendum)
It was great to see you!  Our plans for today:  - We are starting a new medication for your diabetes. Take this once weekly.  - We sent a prescription for a new blood sugar sensor to your pharmacy. Let us know if you have issues getting this.  - See below for tips on healthy eating with diabetes. - Come back in 1 month for followup.   Take care and seek immediate care sooner if you develop any concerns.   Dr. Linwood Dibbles  Here is an example of what a healthy plate looks like:    ? Make half your plate fruits and vegetables.     ? Focus on whole fruits.     ? Vary your veggies.  ? Make half your grains whole grains. -     ? Look for the word "whole" at the beginning of the ingredients list    ? Some whole-grain ingredients include whole oats, whole-wheat flour,        whole-grain corn, whole-grain brown rice, and whole rye.  ? Move to low-fat and fat-free milk or yogurt.  ? Vary your protein routine. - Meat, fish, poultry (chicken, [place]), eggs, beans (kidney, pinto), dairy.  ? Drink and eat less sodium, saturated fat, and added sugars.   Diet Recommendations for Diabetes   1. Eat at least 3 meals and 1-2 snacks per day. Never go more than 4-5 hours while awake without eating. Eat breakfast within the first hour of getting up.   2. Limit starchy foods to TWO per meal and ONE per snack. ONE portion of a starchy  food is equal to the following:   - ONE slice of bread (or its equivalent, such as half of a hamburger bun).   - 1/2 cup of a "scoopable" starchy food such as potatoes or rice.   - 15 grams of Total Carbohydrate as shown on food label.  3. Include at every meal: a protein food, a carb food, and vegetables and/or fruit.   - Obtain twice the volume of vegetables as protein or carbohydrate foods for both lunch and dinner.   - Fresh or frozen vegetables are best.   - Keep frozen vegetables on hand for a quick vegetable serving.       Starchy (carb) foods: Bread, rice,  pasta, potatoes, corn, cereal, grits, crackers, bagels, muffins, all baked goods.  (Fruits, milk, and yogurt also have carbohydrate, but most of these foods will not spike your blood sugar as most starchy foods will.)  A few fruits do cause high blood sugars; use small portions of bananas (limit to 1/2 at a time), grapes, watermelon, oranges, and most tropical fruits.    Protein foods: Meat, fish, poultry, eggs, dairy foods, and beans such as pinto and kidney beans (beans also provide carbohydrate).

## 2021-02-24 NOTE — Progress Notes (Signed)
    SUBJECTIVE:   CHIEF COMPLAINT / HPI:   Diabetes, Type 2 - Last A1c 10.4 01/2021 - Medications: jardiance 25mg , toujeo 10-30u daily (taking 16u daily) - previously on victoza, tresiba, metformin - Compliance: good - Checking BG at home: 170s-340s - Diet: higher glycemic foods, breads.  - Exercise: none formally. Walks a lot at work.  - Eye exam: referred, has upcoming appt - Foot exam: due - Microalbumin: UTD - Statin: yes - Denies symptoms of hypoglycemia, numbness extremities, foot ulcers/trauma  Asthma - Medications: albuterol PRN, symbicort 2 puffs BID, flonase, singulair - Taking: hasn't had to use albuterol.  - Common triggers: exercise, dust - ED visits/hospitalization in the last 6 months: none - Current symptoms: none   OBJECTIVE:   BP 122/78   Pulse 83   Temp (!) 97.4 F (36.3 C)   Resp 16   Ht 5\' 5"  (1.651 m)   Wt 246 lb 6.4 oz (111.8 kg)   LMP 02/24/2021   SpO2 99%   BMI 41.00 kg/m   Gen: well appearing, in NAD Card: Reg rate Lungs: Comfortable WOB on RA Ext: WWP, no edema. Foot exam wnl.    ASSESSMENT/PLAN:   Insulin dependent type 2 diabetes mellitus, uncontrolled (HCC) Uncontrolled with CBGs still 170-340s, mainly in 200s. Will add semaglutide, continue jardiance and toujeo. New rx sent for CBG sensor. Foot exam wnl. Counseled on weight loss and diabetic diet today, see handout. F/u in 4 weeks.   Mild persistent asthma without complication Doing well on current regimen, no changes made today.    , DO

## 2021-03-11 DIAGNOSIS — M059 Rheumatoid arthritis with rheumatoid factor, unspecified: Principal | ICD-10-CM

## 2021-03-13 ENCOUNTER — Ambulatory Visit: Payer: BC Managed Care – PPO | Admitting: Gastroenterology

## 2021-03-27 ENCOUNTER — Ambulatory Visit: Payer: BC Managed Care – PPO | Admitting: Family Medicine

## 2021-04-22 ENCOUNTER — Ambulatory Visit: Payer: Self-pay

## 2021-04-29 ENCOUNTER — Ambulatory Visit: Payer: BC Managed Care – PPO | Admitting: Gastroenterology

## 2021-04-30 ENCOUNTER — Encounter: Payer: Self-pay | Admitting: *Deleted

## 2021-06-17 ENCOUNTER — Ambulatory Visit: Payer: Self-pay | Attending: Oncology

## 2021-06-24 ENCOUNTER — Telehealth: Payer: Self-pay | Admitting: *Deleted

## 2021-07-03 ENCOUNTER — Encounter: Payer: Self-pay | Admitting: Family Medicine

## 2021-07-03 ENCOUNTER — Ambulatory Visit (INDEPENDENT_AMBULATORY_CARE_PROVIDER_SITE_OTHER): Payer: Self-pay | Admitting: Family Medicine

## 2021-07-03 ENCOUNTER — Telehealth: Payer: Self-pay | Admitting: *Deleted

## 2021-07-03 ENCOUNTER — Other Ambulatory Visit: Payer: Self-pay

## 2021-07-03 VITALS — BP 118/78 | HR 96 | Temp 98.4°F | Resp 16 | Ht 65.0 in | Wt 257.0 lb

## 2021-07-03 DIAGNOSIS — E039 Hypothyroidism, unspecified: Secondary | ICD-10-CM

## 2021-07-03 DIAGNOSIS — Z794 Long term (current) use of insulin: Secondary | ICD-10-CM

## 2021-07-03 DIAGNOSIS — E1169 Type 2 diabetes mellitus with other specified complication: Secondary | ICD-10-CM

## 2021-07-03 DIAGNOSIS — Z6838 Body mass index (BMI) 38.0-38.9, adult: Secondary | ICD-10-CM

## 2021-07-03 DIAGNOSIS — Z599 Problem related to housing and economic circumstances, unspecified: Secondary | ICD-10-CM

## 2021-07-03 DIAGNOSIS — Z5989 Other problems related to housing and economic circumstances: Secondary | ICD-10-CM

## 2021-07-03 DIAGNOSIS — E1165 Type 2 diabetes mellitus with hyperglycemia: Secondary | ICD-10-CM

## 2021-07-03 DIAGNOSIS — Z8719 Personal history of other diseases of the digestive system: Secondary | ICD-10-CM

## 2021-07-03 DIAGNOSIS — E785 Hyperlipidemia, unspecified: Secondary | ICD-10-CM

## 2021-07-03 DIAGNOSIS — J4530 Mild persistent asthma, uncomplicated: Secondary | ICD-10-CM

## 2021-07-03 DIAGNOSIS — IMO0002 Reserved for concepts with insufficient information to code with codable children: Secondary | ICD-10-CM

## 2021-07-03 DIAGNOSIS — R079 Chest pain, unspecified: Secondary | ICD-10-CM

## 2021-07-03 LAB — POCT GLYCOSYLATED HEMOGLOBIN (HGB A1C): Hemoglobin A1C: 10.7 % — AB (ref 4.0–5.6)

## 2021-07-03 MED ORDER — OMEPRAZOLE 20 MG PO CPDR: 20.0000 mg | DELAYED_RELEASE_CAPSULE | Freq: Every day | ORAL | 3 refills | Status: DC

## 2021-07-03 MED ORDER — EMPAGLIFLOZIN 25 MG PO TABS: 25.0000 mg | ORAL_TABLET | Freq: Every day | ORAL | 3 refills | Status: DC

## 2021-07-03 MED ORDER — TRESIBA FLEXTOUCH 100 UNIT/ML ~~LOC~~ SOPN: 10.0000 [IU] | PEN_INJECTOR | Freq: Every day | SUBCUTANEOUS | 0 refills | Status: DC

## 2021-07-03 MED ORDER — ATORVASTATIN CALCIUM 10 MG PO TABS
10.0000 mg | ORAL_TABLET | Freq: Every day | ORAL | 3 refills | Status: DC
Start: 2021-07-03 — End: 2022-04-28

## 2021-07-03 MED ORDER — LEVOTHYROXINE SODIUM 175 MCG PO TABS: 175.0000 ug | ORAL_TABLET | Freq: Every day | ORAL | 3 refills | Status: DC

## 2021-07-03 NOTE — Patient Instructions (Signed)
Go to the ER with any chest pain - do not take family members nitroglycerin tablets - call 911 ambulance can assess you and see if the meds are needed  Restart insulin - your blood sugars are uncontrolled, A1C 10.7  Look at getting established with [facility] [facility] or other community health centers that are federally funded and granted to allow little or no cost health care for uninsured and/or underinsured individuals.  You can also look at [facility]

## 2021-07-03 NOTE — Progress Notes (Signed)
Name: Ashley Cochran   MRN: [id number]    DOB: June 10, 1976   Date:07/03/2021       Progress Note  Chief Complaint  Patient presents with   Diabetes    Subjective:   Ashley Cochran is a 45 y.o. female, presents to clinic for f/up on IDDM, HTN, hypothyroid She also endorses CP a few weeks ago which was relieved with a family members nitroglycerin CP was on July 3 sudden onset across chest upper anterior chest described as tightening caused her to " double over" lasted only a minute or two and when she took SL NG instantly was relieved  Pt has IDDM, poorly controlled, recently returned to clinic early this year to restart meds, jardiance and toujeo was restarted and titrated, unfortunately pt ran out of insulin over a month ago, is not checking blood sugars and is currently unemployed and does not have health insurance Denies: Polyuria, polydipsia, vision changes, neuropathy, hypoglycemia Recent pertinent labs: Today POC A1C done - 10.7  Lab Results  Component Value Date   HGBA1C 10.4 (H) 01/23/2021   HGBA1C 8.1 (H) 12/28/2019   HGBA1C 10.1 (H) 06/20/2019   She is on lisinopril 2.5 mg for renal protectsion, lipitor 10 mg BP Readings from Last 3 Encounters:  07/03/21 118/78  02/24/21 122/78  01/23/21 115/70     Chemistry      Component Value Date/Time   NA 137 01/23/2021 0939   NA 135 12/08/2018 1920   NA 134 (L) 02/02/2014 2059   K 4.5 01/23/2021 0939   K 3.6 02/02/2014 2059   CL 102 01/23/2021 0939   CL 101 02/02/2014 2059   CO2 24 01/23/2021 0939   CO2 22 02/02/2014 2059   BUN 14 01/23/2021 0939   BUN 12 12/08/2018 1920   BUN 11 02/02/2014 2059   CREATININE 0.59 01/23/2021 0939   GLU 123 09/24/2015 0000      Component Value Date/Time   CALCIUM 9.3 01/23/2021 0939   CALCIUM 9.0 02/02/2014 2059   ALKPHOS 124 (H) 12/08/2018 1920   ALKPHOS 143 (H) 02/02/2014 2059   AST 28 01/23/2021 0939   AST 46 (H) 02/02/2014 2059   ALT 28 01/23/2021 0939   ALT 51 02/02/2014  2059   BILITOT 0.6 01/23/2021 0939   BILITOT 0.2 12/08/2018 1920   BILITOT 0.6 02/02/2014 2059     Lab Results  Component Value Date   CHOL 165 01/23/2021   HDL 46 (L) 01/23/2021   LDLCALC 97 01/23/2021   TRIG 122 01/23/2021   CHOLHDL 3.6 01/23/2021   She need refills on most meds today  Hypothyroid On 175 mcg levothyroxine daily in am - needs refill Lab Results  Component Value Date   TSH 1.68 01/23/2021   GERD on omeprazole - takes at bedtime to avoid taking w/thyroid meds     Current Outpatient Medications:    albuterol (PROVENTIL) (2.5 MG/3ML) 0.083% nebulizer solution, Take 3 mLs (2.5 mg total) by nebulization every 6 (six) hours as needed for wheezing or shortness of breath., Disp: 100 mL, Rfl: 1   albuterol (VENTOLIN HFA) 108 (90 Base) MCG/ACT inhaler, Inhale 2 puffs into the lungs every 6 (six) hours as needed for wheezing or shortness of breath., Disp: 6.7 g, Rfl: 1   atorvastatin (LIPITOR) 10 MG tablet, Take 1 tablet (10 mg total) by mouth at bedtime., Disp: 90 tablet, Rfl: 1   budesonide-formoterol (SYMBICORT) 80-4.5 MCG/ACT inhaler, Inhale 2 puffs into the lungs 2 (two) times daily., Disp: 6.9  g, Rfl: 1   Continuous Blood Gluc Sensor (FREESTYLE LIBRE 2 SENSOR) MISC, 1 each by Does not apply route every 14 (fourteen) days., Disp: 4 each, Rfl: 3   empagliflozin (JARDIANCE) 25 MG TABS tablet, Take 1 tablet (25 mg total) by mouth daily., Disp: 90 tablet, Rfl: 0   ENBREL SURECLICK 50 MG/ML injection, Inject into the skin., Disp: , Rfl:    fluticasone (FLONASE) 50 MCG/ACT nasal spray, Place 2 sprays into both nostrils daily., Disp: 16 g, Rfl: 6   insulin glargine, 1 Unit Dial, (TOUJEO SOLOSTAR) 300 UNIT/ML Solostar Pen, Inject 10-30 Units into the skin daily. (Patient taking differently: Inject 18 Units into the skin daily.), Disp: 9 mL, Rfl: 3   Insulin Pen Needle 29G X MISC, Use as directed with insulin daily, Disp: 100 each, Rfl: 1   levothyroxine (SYNTHROID) 175  MCG tablet, Take 1 tablet (175 mcg total) by mouth daily before breakfast., Disp: 90 tablet, Rfl: 1   lisinopril (ZESTRIL) 2.5 MG tablet, Take 1 tablet (2.5 mg total) by mouth daily., Disp: 90 tablet, Rfl: 1   montelukast (SINGULAIR) 10 MG tablet, TAKE ONE TABLET BY MOUTH ONCE DAILY FOR ASTHMA, Disp: 90 tablet, Rfl: 3   naproxen (NAPROSYN) 500 MG tablet, naproxen 500 mg tablet  Take 1 tablet twice a day by oral route., Disp: , Rfl:    omeprazole (PRILOSEC) 20 MG capsule, Take 1 capsule (20 mg total) by mouth daily. Take more than 4 hours after taking morning levothyroxine (may also take at bedtime), Disp: 90 capsule, Rfl: 3  Patient Active Problem List   Diagnosis Date Noted   Mild persistent asthma without complication 01/23/2021   Immunocompromised state due to drug therapy (HCC) 01/23/2021   Patient's noncompliance with other medical treatment and regimen 01/23/2021   Chest pain 01/23/2021   Acute bilateral low back pain without sciatica 08/19/2020   H/O gastroesophageal reflux (GERD) 06/21/2020   Class 2 severe obesity with serious comorbidity and body mass index (BMI) of 38.0 to 38.9 in adult (HCC) 01/26/2019   Insulin dependent type 2 diabetes mellitus, uncontrolled (HCC) 01/26/2019   Dyslipidemia associated with type 2 diabetes mellitus (HCC) 01/26/2019   Hypothyroidism 04/09/2015   Rheumatoid arthritis (HCC) 04/09/2015    History reviewed. No pertinent surgical history.  Family History  Problem Relation Age of Onset   Hypertension Mother    Diabetes Mother        type 2   Asthma Mother    Stroke Mother        Mini strokes   GER disease Mother    Hypertension Father    Diabetes Father        Type 2   Asthma Father    GER disease Father    Asthma Sister    Allergies Sister    Asthma Brother    Allergies Brother     Social History   Tobacco Use   Smoking status: Former    Types: Cigarettes    Quit date: 09/20/2004    Years since quitting: 16.7   Smokeless  tobacco: Never  Vaping Use   Vaping Use: Never used  Substance Use Topics   Alcohol use: No   Drug use: No     Allergies  Allergen Reactions   Nitrofuran Derivatives Rash    jittery   Latex    Ranitidine Hcl      Causes jitters   Strawberry Flavor    Metformin And Related Diarrhea    Could  not tolerate normal and XR metformin, severe GI cramping and diarrhea    Tape Rash    Health Maintenance  Topic Date Due   OPHTHALMOLOGY EXAM  Never done   COVID-19 Vaccine (4 - Booster for Pfizer series) 11/05/2020   MAMMOGRAM  03/21/2021   COLONOSCOPY (Pts 45-20yrs Insurance coverage will need to be confirmed)  01/23/2022 (Originally 12/08/2019)   INFLUENZA VACCINE  07/21/2021   HEMOGLOBIN A1C  07/23/2021   FOOT EXAM  02/24/2022   Pneumococcal Vaccine 49-58 Years old (3 - PPSV23 or PCV20) 08/09/2022   PAP SMEAR-Modifier  12/27/2022   TETANUS/TDAP  01/23/2031   PNEUMOCOCCAL POLYSACCHARIDE VACCINE AGE 4-64 HIGH RISK  Completed   Hepatitis C Screening  Completed   HIV Screening  Completed   HPV VACCINES  Aged Out    Chart Review Today: I personally reviewed active problem list, medication list, allergies, family history, social history, health maintenance, notes from last encounter, lab results, imaging with the patient/caregiver today.   Review of Systems  Constitutional: Negative.   HENT: Negative.    Eyes: Negative.   Respiratory: Negative.    Cardiovascular: Negative.   Gastrointestinal: Negative.   Endocrine: Negative.   Genitourinary: Negative.   Musculoskeletal: Negative.   Skin: Negative.   Allergic/Immunologic: Negative.   Neurological: Negative.   Hematological: Negative.   Psychiatric/Behavioral: Negative.    All other systems reviewed and are negative.   Objective:   Vitals:   07/03/21 0823  BP: 118/78  Pulse: 96  Resp: 16  Temp: 98.4 F (36.9 C)  SpO2: 96%  Weight: 257 lb (116.6 kg)  Height: 5\' 5"  (1.651 m)    Body mass index is 42.77  kg/m.  Physical Exam Vitals and nursing note reviewed.  Constitutional:      General: She is not in acute distress.    Appearance: She is obese. She is not ill-appearing, toxic-appearing or diaphoretic.  HENT:     Head: Normocephalic and atraumatic.  Cardiovascular:     Rate and Rhythm: Normal rate and regular rhythm.     Pulses: Normal pulses.     Heart sounds: Normal heart sounds. No murmur heard.   No friction rub. No gallop.  Pulmonary:     Effort: Pulmonary effort is normal. No respiratory distress.     Breath sounds: Normal breath sounds. No stridor. No wheezing or rales.  Abdominal:     General: Bowel sounds are normal.     Palpations: Abdomen is soft.  Musculoskeletal:     Right lower leg: No edema.     Left lower leg: No edema.  Skin:    General: Skin is warm and dry.     Coloration: Skin is not jaundiced or pale.  Neurological:     Mental Status: She is alert. Mental status is at baseline.  Psychiatric:        Mood and Affect: Mood normal.        Behavior: Behavior normal.    ECG interpretation   Date: 07/03/21  Rate: 69  Rhythm: normal sinus rhythm  QRS Axis: normal  Intervals: normal  ST/T Wave abnormalities: non-specific T-wave abnormlity, no ST elevation or depression  Conduction Disutrbances: none  Narrative Interpretation: NSR  Old EKG Reviewed: No significant changes noted- compared to 05/17/2020 ECG      Assessment & Plan:     ICD-10-CM   1. Insulin dependent type 2 diabetes mellitus, uncontrolled (HCC)  E11.65 POCT glycosylated hemoglobin (Hb A1C)   Z79.4 AMB Referral to  Community Care Coordinaton    insulin degludec (TRESIBA FLEXTOUCH) 100 UNIT/ML FlexTouch Pen   off insulin, uncontrolled, A1C elevated today, insulin samples given    2. Uncontrolled type 2 diabetes mellitus with hyperglycemia (HCC)  E11.65 atorvastatin (LIPITOR) 10 MG tablet    empagliflozin (JARDIANCE) 25 MG TABS tablet    POCT glycosylated hemoglobin (Hb A1C)    AMB  Referral to Community Care Coordinaton    insulin degludec (TRESIBA FLEXTOUCH) 100 UNIT/ML FlexTouch Pen    3. Hypothyroidism, unspecified type  E03.9 levothyroxine (SYNTHROID) 175 MCG tablet    AMB Referral to [facility] Coordinaton   last labs TSH in normal range, refills given    4. H/O gastroesophageal reflux (GERD)  Z87.19 omeprazole (PRILOSEC) 20 MG capsule    AMB Referral to [facility]   continue omeprazole - did refill, can get OTC    5. Dyslipidemia associated with type 2 diabetes mellitus (HCC)  E11.69 AMB Referral to [facility] Coordinaton   E78.5    continue statin    6. Class 2 severe obesity with serious comorbidity and body mass index (BMI) of 38.0 to 38.9 in adult, unspecified obesity type (HCC)  E66.01 AMB Referral to [facility] Coordinaton   Z68.38     7. Mild persistent asthma without complication  J45.30 AMB Referral to [facility] Coordinaton   currently well controlled    8. Chest pain, unspecified type  R07.9 AMB Referral to [facility] Coordinaton   a few weeks ago, lasted a few minutes, relieved with family members SLNG, ECG today SR w/o ST elevation or depression, no exertional sx since    9. Financial difficulties  Z59.9 AMB Referral to [facility] Coordinaton    10. Does not have health insurance  Z59.89 AMB Referral to [facility] Coordinaton      Tresiba samples given to pt - 2 pens, 300 units each - pt will restart insulin at 10 unit daily dose, she was up to 16 unit dose on toujeo  CP episode reported today, no recurrence of CP, no exertional sx, ECG reassuring, pt high risk so advised to call 911 or go to ER with CP episodes to r/o ACS  No follow-ups on file.   Danelle Berry, PA-C 07/03/21 8:42 AM

## 2021-07-03 NOTE — Chronic Care Management (AMB) (Signed)
  Care Management   Note  07/03/2021 Name: Ashley Cochran MRN: [id number] DOB: Jul 04, 1976  Ashley Cochran is a 45 y.o. year old female who is a primary care patient of Danelle Berry, [place]. I reached out to [name] by phone today in response to a referral sent by [name] PCP, Danelle Berry, PA-C.    [name] was given information about care management services today including:  Care management services include personalized support from designated clinical staff supervised by her physician, including individualized plan of care and coordination with other care providers 24/7 contact phone numbers for assistance for urgent and routine care needs. The patient may stop care management services at any time by phone call to the office staff.  Patient agreed to services and verbal consent obtained.   Follow up plan: Telephone appointment with care management team member scheduled for:07/10/2021  [facility] Guide, Embedded Care Coordination [facility] Management

## 2021-07-07 ENCOUNTER — Telehealth: Payer: Self-pay

## 2021-07-07 NOTE — Progress Notes (Signed)
    Chronic Care Management Pharmacy Assistant   Name: Caeley Dohrmann  MRN: [id number] DOB: 1976/04/17  I received a task about this patient not having insurance and is unemployed and unfortunately isn't able to cover her medications. Angelena Sole, CPP is requesting that PAP for Evaristo Bury and Gates Rigg be completed for her.  I spoke to the patient and inquired has she filed for Medicaid in which she stated she hasn't. I informed her of the website for Medicaid in order for her to fill out that application to see if she can get approved. I offered to assist with the application process, but the patient declined at this time as she was not home and stated she would have her sister assist her with the Medicaid application. The patient was informed that if she is approved for Medicaid this would cover her medical and pharmacy cost. I also informed her that I would complete the paperwork for her insulin and to be on the look out for those applications to come to her address for her signature and  proof of income. I advised the patient that once she has completed the application to return them to the clinic for someone to be able to fax them over for her.  Patient does not qualify for the CCM program with [employer] but we will assist her with her PAP documents.  PAP applications sent via email to Billee Cashing, PTM for mailing to patient's address  Patient verbalized understanding to all.   Adelene Idler, CPA/CMA Clinical Pharmacist Assistant Phone: [phone]

## 2021-07-08 ENCOUNTER — Ambulatory Visit: Payer: Self-pay | Admitting: *Deleted

## 2021-07-08 DIAGNOSIS — Z599 Problem related to housing and economic circumstances, unspecified: Secondary | ICD-10-CM

## 2021-07-08 DIAGNOSIS — IMO0002 Reserved for concepts with insufficient information to code with codable children: Secondary | ICD-10-CM

## 2021-07-08 DIAGNOSIS — E1165 Type 2 diabetes mellitus with hyperglycemia: Secondary | ICD-10-CM

## 2021-07-09 NOTE — Chronic Care Management (AMB) (Signed)
Care Management Clinical Social Work Note  07/09/2021 Name: Ashley Cochran MRN: [id number] DOB: Apr 15, 1976  Ashley Cochran is a 45 y.o. year old female who is a primary care patient of Ashley Cochran, [place].  The Care Management team was consulted for assistance with chronic disease management and coordination needs.  Engaged with patient by telephone for initial visit in response to provider referral for social work chronic care management and care coordination services  Consent to Services:  [name] was given information about Care Management services today including:  Care Management services includes personalized support from designated clinical staff supervised by her physician, including individualized plan of care and coordination with other care providers 24/7 contact phone numbers for assistance for urgent and routine care needs. The patient may stop case management services at any time by phone call to the office staff.  Patient agreed to services and consent obtained.   Assessment: Review of patient past medical history, allergies, medications, and health status, including review of relevant consultants reports was performed today as part of a comprehensive evaluation and provision of chronic care management and care coordination services.  SDOH (Social Determinants of Health) assessments and interventions performed:  SDOH Interventions    Flowsheet Row Most Recent Value  SDOH Interventions   SDOH Interventions for the Following Domains Financial Strain  Financial Strain Interventions Other (Comment)  [difficulty affording medications due to lack of insurance coverage-will provide patient with Medicaid and food stamp applications]        Advanced Directives Status: Not addressed in this encounter.  Care Plan  Allergies  Allergen Reactions   Nitrofuran Derivatives Rash    jittery   Latex    Ranitidine Hcl      Causes jitters   Strawberry Flavor    Metformin And  Related Diarrhea    Could not tolerate normal and XR metformin, severe GI cramping and diarrhea    Tape Rash    Outpatient Encounter Medications as of 07/08/2021  Medication Sig   albuterol (PROVENTIL) (2.5 MG/3ML) 0.083% nebulizer solution Take 3 mLs (2.5 mg total) by nebulization every 6 (six) hours as needed for wheezing or shortness of breath.   albuterol (VENTOLIN HFA) 108 (90 Base) MCG/ACT inhaler Inhale 2 puffs into the lungs every 6 (six) hours as needed for wheezing or shortness of breath.   atorvastatin (LIPITOR) 10 MG tablet Take 1 tablet (10 mg total) by mouth at bedtime.   budesonide-formoterol (SYMBICORT) 80-4.5 MCG/ACT inhaler Inhale 2 puffs into the lungs 2 (two) times daily.   Continuous Blood Gluc Sensor (FREESTYLE LIBRE 2 SENSOR) MISC 1 each by Does not apply route every 14 (fourteen) days.   empagliflozin (JARDIANCE) 25 MG TABS tablet Take 1 tablet (25 mg total) by mouth daily.   ENBREL SURECLICK 50 MG/ML injection Inject into the skin.   fluticasone (FLONASE) 50 MCG/ACT nasal spray Place 2 sprays into both nostrils daily.   insulin degludec (TRESIBA FLEXTOUCH) 100 UNIT/ML FlexTouch Pen Inject 10-20 Units into the skin daily.   Insulin Pen Needle 29G X MISC Use as directed with insulin daily   levothyroxine (SYNTHROID) 175 MCG tablet Take 1 tablet (175 mcg total) by mouth daily before breakfast.   lisinopril (ZESTRIL) 2.5 MG tablet Take 1 tablet (2.5 mg total) by mouth daily.   montelukast (SINGULAIR) 10 MG tablet TAKE ONE TABLET BY MOUTH ONCE DAILY FOR ASTHMA   naproxen (NAPROSYN) 500 MG tablet naproxen 500 mg tablet  Take 1 tablet twice a day by oral  route.   omeprazole (PRILOSEC) 20 MG capsule Take 1 capsule (20 mg total) by mouth daily. Take more than 4 hours after taking morning levothyroxine (may also take at bedtime)   No facility-administered encounter medications on file as of 07/08/2021.    Patient Active Problem List   Diagnosis Date Noted   Mild  persistent asthma without complication 01/23/2021   Immunocompromised state due to drug therapy (HCC) 01/23/2021   Patient's noncompliance with other medical treatment and regimen 01/23/2021   Chest pain 01/23/2021   Acute bilateral low back pain without sciatica 08/19/2020   H/O gastroesophageal reflux (GERD) 06/21/2020   Class 2 severe obesity with serious comorbidity and body mass index (BMI) of 38.0 to 38.9 in adult [facility]) 01/26/2019   Insulin dependent type 2 diabetes mellitus, uncontrolled (HCC) 01/26/2019   Dyslipidemia associated with type 2 diabetes mellitus (HCC) 01/26/2019   Hypothyroidism 04/09/2015   Rheumatoid arthritis (HCC) 04/09/2015    Conditions to be addressed/monitored:  insulin dependent diabetes ; Financial constraints related to lack of insurance  Care Plan : General Social Work (Adult)  Updates made by [employer], Clent Jacks, LCSW since 07/09/2021 12:00 AM     Problem: CHL AMB "PATIENT-SPECIFIC PROBLEM"   Note:   Current Barriers:   Patient currently unemployed/uninsured Suicidal Ideation/Homicidal Ideation: No  Clinical Social Work Goal(s):  Over the next 90 days, patient will work with SW bi-weekly by telephone or in person to check status of medicaid and food stamp applications mailed to her home  Interventions: Patient interviewed and appropriate assessments performed: PHQ 2 SDOH Interventions    Flowsheet Row Most Recent Value  SDOH Interventions   SDOH Interventions for the Following Domains Financial Strain  Financial Strain Interventions Other (Comment)  [difficulty affording medications due to lack of insurance coverage-will provide patient with Medicaid and food stamp applications]     Patient discussed  recently losing her job and having difficulty affording medications due to a lack of insurance Patient discussed missing work due to illness and has now lost her insurance coverage Applying for disability explored, however patient does not feel that  she will qualify Patient confirmed living with aunt, no other needs at this time other that need to obtain her medications Patient agreed to consider applying for Medicaid and food stamps-applications will be mailed to her home Active listening / Reflection utilized  Emotional Support Provided regarding patient's medical condition and need for obtaining her medications  Patient Self Care Activities:  Performs ADL's independently Performs IADL's independently  Patient Coping Strengths:  Supportive Relationships Family Able to Communicate Effectively  Patient Self Care Deficits:  Minimal insurance coverage  Initial goal documentation       Follow Up Plan: SW will follow up with patient by phone over the next 7-14 business days  [place], LCSW Clinical Social Worker  [facility] Care Management [phone]

## 2021-07-09 NOTE — Patient Instructions (Addendum)
Visit Information  PATIENT GOALS:   Goals Addressed             This Visit's Progress    Find Help in My Community       Timeframe:  Long-Range Goal Priority:  Medium Start Date:     07/08/21                        Expected End Date:    02/08/21                   Follow Up Date 07/22/21    - begin a notebook of services in my neighborhood or community - call 211 when I need some help - follow-up on any referrals for help I am given - think ahead to make sure my need does not become an emergency - have a back-up plan - make a list of family or friends that I can call    Why is this important?   Knowing how and where to find help for yourself or family in your neighborhood and community is an important skill.  You will want to take some steps to learn how.    Notes:         Ms. Mulka was given information about Care Management services by the embedded care coordination team including:  Care Management services include personalized support from designated clinical staff supervised by her physician, including individualized plan of care and coordination with other care providers 24/7 contact phone numbers for assistance for urgent and routine care needs. The patient may stop CCM services at any time (effective at the end of the month) by phone call to the office staff.  Patient agreed to services and verbal consent obtained.   The patient verbalized understanding of instructions, educational materials, and care plan provided today and declined offer to receive copy of patient instructions, educational materials, and care plan.   Telephone follow up appointment with care management team member scheduled for:08/01/21  Verna Czech, LCSW Clinical Social Worker  [facility] [phone]

## 2021-07-10 ENCOUNTER — Ambulatory Visit: Payer: Self-pay | Admitting: *Deleted

## 2021-07-10 ENCOUNTER — Telehealth: Payer: Self-pay

## 2021-07-10 NOTE — Chronic Care Management (AMB) (Signed)
   07/10/2021  Shenita Trego 1976-04-21 [id number]   Food stamp and medicaid application mailed out to patient's home for review and completion.  This social worker to follow up on status of the applications during next scheduled appointment on 08/01/21.   Verna Czech, LCSW Clinical [facility] [phone]

## 2021-07-24 ENCOUNTER — Ambulatory Visit: Admit: 2021-07-24 | Discharge: 2021-07-24 | Attending: Family | Primary: Family

## 2021-07-24 ENCOUNTER — Ambulatory Visit: Admit: 2021-07-24 | Discharge: 2021-07-24

## 2021-07-24 DIAGNOSIS — Z79899 Other long term (current) drug therapy: Principal | ICD-10-CM

## 2021-07-24 DIAGNOSIS — M059 Rheumatoid arthritis with rheumatoid factor, unspecified: Principal | ICD-10-CM

## 2021-07-24 DIAGNOSIS — Z5181 Encounter for therapeutic drug level monitoring: Principal | ICD-10-CM

## 2021-07-24 MED ORDER — ETANERCEPT 50 MG/ML (1 ML) SUBCUTANEOUS PEN INJECTOR
SUBCUTANEOUS | 3 refills | 84.00000 days | Status: CP
Start: 2021-07-24 — End: ?

## 2021-07-25 DIAGNOSIS — M059 Rheumatoid arthritis with rheumatoid factor, unspecified: Principal | ICD-10-CM

## 2021-08-01 ENCOUNTER — Ambulatory Visit: Payer: Self-pay | Admitting: *Deleted

## 2021-08-01 DIAGNOSIS — IMO0002 Reserved for concepts with insufficient information to code with codable children: Secondary | ICD-10-CM

## 2021-08-01 DIAGNOSIS — Z599 Problem related to housing and economic circumstances, unspecified: Secondary | ICD-10-CM

## 2021-08-01 DIAGNOSIS — E1165 Type 2 diabetes mellitus with hyperglycemia: Secondary | ICD-10-CM

## 2021-08-01 NOTE — Chronic Care Management (AMB) (Signed)
Care Management Clinical Social Work Note  08/01/2021 Name: Ashley Cochran MRN: [id number] DOB: Feb 23, 1976  Ashley Cochran is a 45 y.o. year old female who is a primary care patient of Danelle Berry, [place].  The Care Management team was consulted for assistance with chronic disease management and coordination needs.  Engaged with patient by telephone for follow up visit in response to provider referral for social work chronic care management and care coordination services  Assessment: Review of patient past medical history, allergies, medications, and health status, including review of relevant consultants reports was performed today as part of a comprehensive evaluation and provision of chronic care management and care coordination services.  SDOH (Social Determinants of Health) assessments and interventions performed:    Advanced Directives Status: Not addressed in this encounter.  Care Plan  Allergies  Allergen Reactions   Nitrofuran Derivatives Rash    jittery   Latex    Ranitidine Hcl      Causes jitters   Strawberry Flavor    Metformin And Related Diarrhea    Could not tolerate normal and XR metformin, severe GI cramping and diarrhea    Tape Rash    Outpatient Encounter Medications as of 08/01/2021  Medication Sig   albuterol (PROVENTIL) (2.5 MG/3ML) 0.083% nebulizer solution Take 3 mLs (2.5 mg total) by nebulization every 6 (six) hours as needed for wheezing or shortness of breath.   albuterol (VENTOLIN HFA) 108 (90 Base) MCG/ACT inhaler Inhale 2 puffs into the lungs every 6 (six) hours as needed for wheezing or shortness of breath.   atorvastatin (LIPITOR) 10 MG tablet Take 1 tablet (10 mg total) by mouth at bedtime.   budesonide-formoterol (SYMBICORT) 80-4.5 MCG/ACT inhaler Inhale 2 puffs into the lungs 2 (two) times daily.   Continuous Blood Gluc Sensor (FREESTYLE LIBRE 2 SENSOR) MISC 1 each by Does not apply route every 14 (fourteen) days.   empagliflozin (JARDIANCE) 25 MG  TABS tablet Take 1 tablet (25 mg total) by mouth daily.   ENBREL SURECLICK 50 MG/ML injection Inject into the skin.   fluticasone (FLONASE) 50 MCG/ACT nasal spray Place 2 sprays into both nostrils daily.   insulin degludec (TRESIBA FLEXTOUCH) 100 UNIT/ML FlexTouch Pen Inject 10-20 Units into the skin daily.   Insulin Pen Needle 29G X MISC Use as directed with insulin daily   levothyroxine (SYNTHROID) 175 MCG tablet Take 1 tablet (175 mcg total) by mouth daily before breakfast.   lisinopril (ZESTRIL) 2.5 MG tablet Take 1 tablet (2.5 mg total) by mouth daily.   montelukast (SINGULAIR) 10 MG tablet TAKE ONE TABLET BY MOUTH ONCE DAILY FOR ASTHMA   naproxen (NAPROSYN) 500 MG tablet naproxen 500 mg tablet  Take 1 tablet twice a day by oral route.   omeprazole (PRILOSEC) 20 MG capsule Take 1 capsule (20 mg total) by mouth daily. Take more than 4 hours after taking morning levothyroxine (may also take at bedtime)   No facility-administered encounter medications on file as of 08/01/2021.    Patient Active Problem List   Diagnosis Date Noted   Mild persistent asthma without complication 01/23/2021   Immunocompromised state due to drug therapy (HCC) 01/23/2021   Patient's noncompliance with other medical treatment and regimen 01/23/2021   Chest pain 01/23/2021   Acute bilateral low back pain without sciatica 08/19/2020   H/O gastroesophageal reflux (GERD) 06/21/2020   Class 2 severe obesity with serious comorbidity and body mass index (BMI) of 38.0 to 38.9 in adult (HCC) 01/26/2019   Insulin dependent  type 2 diabetes mellitus, uncontrolled (HCC) 01/26/2019   Dyslipidemia associated with type 2 diabetes mellitus (HCC) 01/26/2019   Hypothyroidism 04/09/2015   Rheumatoid arthritis (HCC) 04/09/2015    Conditions to be addressed/monitored: Financial constraints related to lack of insurance  Care Plan : General Social Work (Adult)  Updates made by [employer], Clent Jacks, LCSW since 08/01/2021 12:00 AM      Problem: CHL AMB "PATIENT-SPECIFIC PROBLEM"   Note:   Current Barriers:   Patient currently unemployed/uninsured Suicidal Ideation/Homicidal Ideation: No  Clinical Social Work Goal(s):  Over the next 90 days, patient will work with SW bi-weekly by telephone or in person to check status of medicaid and food stamp applications mailed to her home  Interventions: SDOH Interventions    Flowsheet Row Most Recent Value  SDOH Interventions   SDOH Interventions for the Following Domains Financial Strain  Financial Strain Interventions Other (Comment)  [difficulty affording medications due to lack of insurance coverage-will provide patient with Medicaid and food stamp applications]     Follow up phone call to patient regarding Medicaid and Food stamp applications mailed to her Patient confirmed receiving the applications but have not completed them yet-per patient she is trying to find a part-time job at this time and plans to continue to follow up with the [facility] Patient was at an appointment and requested a call at another time-follow up appointment scheduled for 08/08/21 Emotional Support and reassurance provided-appointment to be continued on 08/08/21  Patient Self Care Activities:  Performs ADL's independently Performs IADL's independently  Patient Coping Strengths:  Supportive Relationships Family Able to Communicate Effectively  Patient Self Care Deficits:  Minimal insurance coverage  Please see past updates related to this goal by clicking on the "Past Updates" button in the selected goal        Follow Up Plan: SW will follow up with patient by phone over the next 7-15 business days  Verna Czech, LCSW Clinical Social Worker  [facility] [phone]

## 2021-08-01 NOTE — Patient Instructions (Signed)
Visit Information   Goals Addressed             This Visit's Progress    Find Help in My Community       Timeframe:  Long-Range Goal Priority:  Medium Start Date:     07/08/21                        Expected End Date:    02/08/21                   Follow Up Date 07/22/21    - begin a notebook of services in my neighborhood or community - call 211 when I need some help - follow-up on any referrals for help I am given - think ahead to make sure my need does not become an emergency - have a back-up plan - make a list of family or friends that I can call  -completed Medicaid and food stamp applications received in the mail   Why is this important?   Knowing how and where to find help for yourself or family in your neighborhood and community is an important skill.  You will want to take some steps to learn how.    Notes:         The patient verbalized understanding of instructions, educational materials, and care plan provided today and declined offer to receive copy of patient instructions, educational materials, and care plan.   Telephone follow up appointment with care management team member scheduled for: 08/08/21  Verna Czech, LCSW Clinical Social Worker  [facility] [phone]

## 2021-08-04 DIAGNOSIS — M059 Rheumatoid arthritis with rheumatoid factor, unspecified: Principal | ICD-10-CM

## 2021-08-04 MED ORDER — NAPROXEN 500 MG TABLET
ORAL_TABLET | Freq: Two times a day (BID) | ORAL | 1 refills | 30 days | Status: CP | PRN
Start: 2021-08-04 — End: ?

## 2021-08-29 ENCOUNTER — Ambulatory Visit: Payer: Self-pay | Admitting: *Deleted

## 2021-08-29 DIAGNOSIS — E1165 Type 2 diabetes mellitus with hyperglycemia: Secondary | ICD-10-CM

## 2021-08-29 DIAGNOSIS — IMO0002 Reserved for concepts with insufficient information to code with codable children: Secondary | ICD-10-CM

## 2021-08-29 DIAGNOSIS — Z599 Problem related to housing and economic circumstances, unspecified: Secondary | ICD-10-CM

## 2021-08-29 NOTE — Patient Instructions (Signed)
Visit Information   Goals Addressed             This Visit's Progress    Find Help in My Community       Timeframe:  Long-Range Goal Priority:  Medium Start Date:     07/08/21                        Expected End Date:    08/29/21                  Follow Up Date 08/29/21   - begin a notebook of services in my neighborhood or community - call 211 when I need some help - follow-up on any referrals for help I am given - think ahead to make sure my need does not become an emergency - have a back-up plan - make a list of family or friends that I can call  -follow up with status of Medicaid and food stamp applications submitted   Why is this important?   Knowing how and where to find help for yourself or family in your neighborhood and community is an important skill.  You will want to take some steps to learn how.    Notes:         The patient verbalized understanding of instructions, educational materials, and care plan provided today and declined offer to receive copy of patient instructions, educational materials, and care plan.   No further follow up required: patient to contact this Child psychotherapist with any additional community resource needs  [employer], LCSW Clinical Social Worker  [facility] [phone]

## 2021-08-29 NOTE — Chronic Care Management (AMB) (Signed)
Care Management Clinical Social Work Note  08/29/2021 Name: Ashley Cochran MRN: [id number] DOB: [birth year]  Ashley Cochran is a 45 y.o. year old female who is a primary care patient of Ashley Cochran, [place].  The Care Management team was consulted for assistance with chronic disease management and coordination needs.  Engaged with patient by telephone for follow up visit in response to provider referral for social work chronic care management and care coordination services  Consent to Services:  [name] was given information about Care Management services today including:  Care Management services includes personalized support from designated clinical staff supervised by her physician, including individualized plan of care and coordination with other care providers 24/7 contact phone numbers for assistance for urgent and routine care needs. The patient may stop case management services at any time by phone call to the office staff.  Patient agreed to services and consent obtained.   Assessment: Review of patient past medical history, allergies, medications, and health status, including review of relevant consultants reports was performed today as part of a comprehensive evaluation and provision of chronic care management and care coordination services.  SDOH (Social Determinants of Health) assessments and interventions performed:    Advanced Directives Status: Not addressed in this encounter.  Care Plan  Allergies  Allergen Reactions   Nitrofuran Derivatives Rash    jittery   Latex    Ranitidine Hcl      Causes jitters   Strawberry Flavor    Metformin And Related Diarrhea    Could not tolerate normal and XR metformin, severe GI cramping and diarrhea    Tape Rash    Outpatient Encounter Medications as of 08/29/2021  Medication Sig   albuterol (PROVENTIL) (2.5 MG/3ML) 0.083% nebulizer solution Take 3 mLs (2.5 mg total) by nebulization every 6 (six) hours as needed for wheezing or  shortness of breath.   albuterol (VENTOLIN HFA) 108 (90 Base) MCG/ACT inhaler Inhale 2 puffs into the lungs every 6 (six) hours as needed for wheezing or shortness of breath.   atorvastatin (LIPITOR) 10 MG tablet Take 1 tablet (10 mg total) by mouth at bedtime.   budesonide-formoterol (SYMBICORT) 80-4.5 MCG/ACT inhaler Inhale 2 puffs into the lungs 2 (two) times daily.   Continuous Blood Gluc Sensor (FREESTYLE LIBRE 2 SENSOR) MISC 1 each by Does not apply route every 14 (fourteen) days.   empagliflozin (JARDIANCE) 25 MG TABS tablet Take 1 tablet (25 mg total) by mouth daily.   ENBREL SURECLICK 50 MG/ML injection Inject into the skin.   fluticasone (FLONASE) 50 MCG/ACT nasal spray Place 2 sprays into both nostrils daily.   insulin degludec (TRESIBA FLEXTOUCH) 100 UNIT/ML FlexTouch Pen Inject 10-20 Units into the skin daily.   Insulin Pen Needle 29G X MISC Use as directed with insulin daily   levothyroxine (SYNTHROID) 175 MCG tablet Take 1 tablet (175 mcg total) by mouth daily before breakfast.   lisinopril (ZESTRIL) 2.5 MG tablet Take 1 tablet (2.5 mg total) by mouth daily.   montelukast (SINGULAIR) 10 MG tablet TAKE ONE TABLET BY MOUTH ONCE DAILY FOR ASTHMA   naproxen (NAPROSYN) 500 MG tablet naproxen 500 mg tablet  Take 1 tablet twice a day by oral route.   omeprazole (PRILOSEC) 20 MG capsule Take 1 capsule (20 mg total) by mouth daily. Take more than 4 hours after taking morning levothyroxine (may also take at bedtime)   No facility-administered encounter medications on file as of 08/29/2021.    Patient Active Problem List  Diagnosis Date Noted   Mild persistent asthma without complication 01/23/2021   Immunocompromised state due to drug therapy (HCC) 01/23/2021   Patient's noncompliance with other medical treatment and regimen 01/23/2021   Chest pain 01/23/2021   Acute bilateral low back pain without sciatica 08/19/2020   H/O gastroesophageal reflux (GERD) 06/21/2020   Class 2  severe obesity with serious comorbidity and body mass index (BMI) of 38.0 to 38.9 in adult [facility]) 01/26/2019   Insulin dependent type 2 diabetes mellitus, uncontrolled (HCC) 01/26/2019   Dyslipidemia associated with type 2 diabetes mellitus (HCC) 01/26/2019   Hypothyroidism 04/09/2015   Rheumatoid arthritis (HCC) 04/09/2015    Conditions to be addressed/monitored: DMII; Financial constraints related to lack of insurance, job loss  Care Plan : General Social Work (Adult)  Updates made by [employer], Clent Jacks, LCSW since 08/29/2021 12:00 AM     Problem: CHL AMB "PATIENT-SPECIFIC PROBLEM"   Priority: Medium  Note:   Current Barriers:   Patient currently unemployed/uninsured Suicidal Ideation/Homicidal Ideation: No  Clinical Social Work Goal(s):  Over the next 90 days, patient will work with SW bi-weekly by telephone or in person to check status of medicaid and food stamp applications mailed to her home  Interventions: SDOH Interventions    Flowsheet Row Most Recent Value  SDOH Interventions   SDOH Interventions for the Following Domains Financial Strain  Financial Strain Interventions Other (Comment)  [difficulty affording medications due to lack of insurance coverage-will provide patient with Medicaid and food stamp applications]     Follow up phone call to patient regarding Medicaid and Food stamp applications mailed to her Patient confirmed receiving the applications and have submitted them-status remains pending - patient confirmed also looking for part-time with plans to to follow up with the [facility] Confirmed that patient plans to complete the application process for the [facility] Patient assessed for additional commununity resource needs with patient verbalizing no additional needs at this time Emotional Support and reassurance provided  Patient Self Care Activities:  Performs ADL's independently Performs IADL's independently  Patient Coping Strengths:   Supportive Relationships Family Able to Communicate Effectively  Patient Self Care Deficits:  Minimal insurance coverage  Please see past updates related to this goal by clicking on the "Past Updates" button in the selected goal        Follow Up Plan: Client will contact this social worker with any additional community resource needs  [place], LCSW Clinical Social Worker  [facility] [phone]

## 2021-10-28 ENCOUNTER — Ambulatory Visit: Payer: Self-pay | Admitting: Family Medicine

## 2021-12-16 IMAGING — MG DIGITAL SCREENING BILAT W/ TOMO W/ CAD
6 of 10 series · 6 of 30 positions shown · non-contrast
Comparison: None.

CLINICAL DATA: Screening. Baseline.

EXAM:
DIGITAL SCREENING BILATERAL MAMMOGRAM WITH TOMO AND CAD

[L MLO synth-2D (1 of 2)]
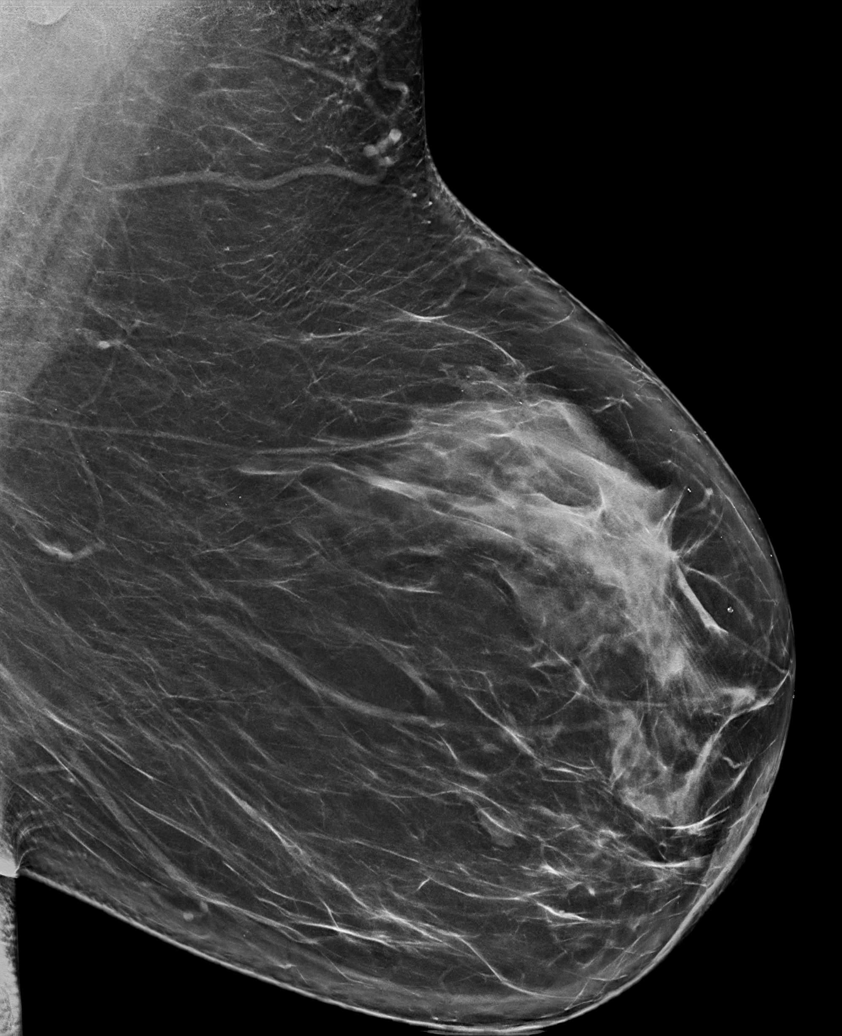

[L MLO synth-2D (2 of 2)]
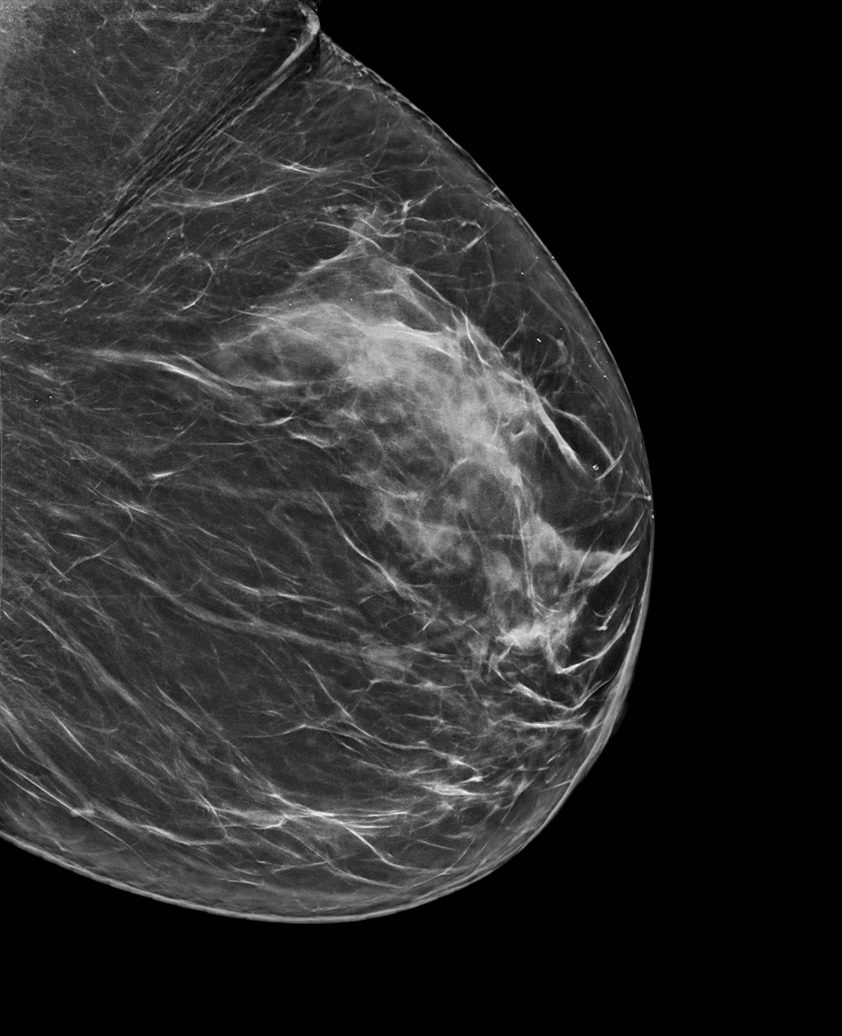

[R CC synth-2D]
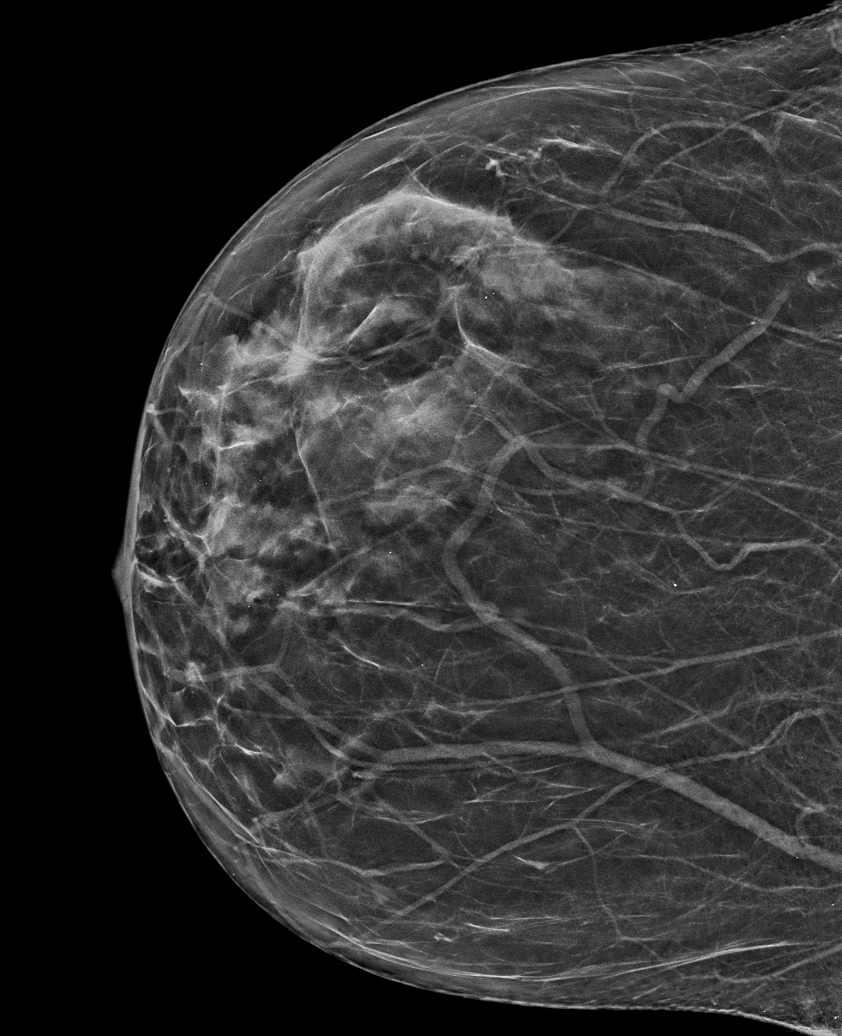

[L CC synth-2D]
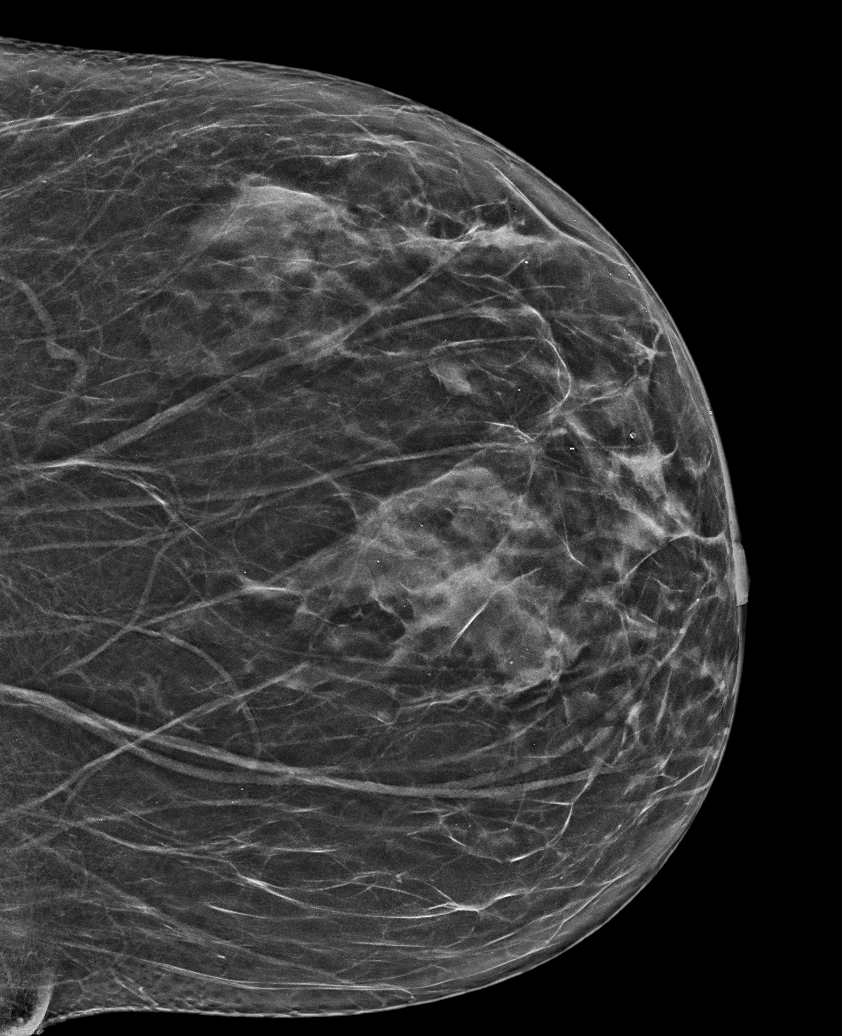

[R MLO synth-2D]
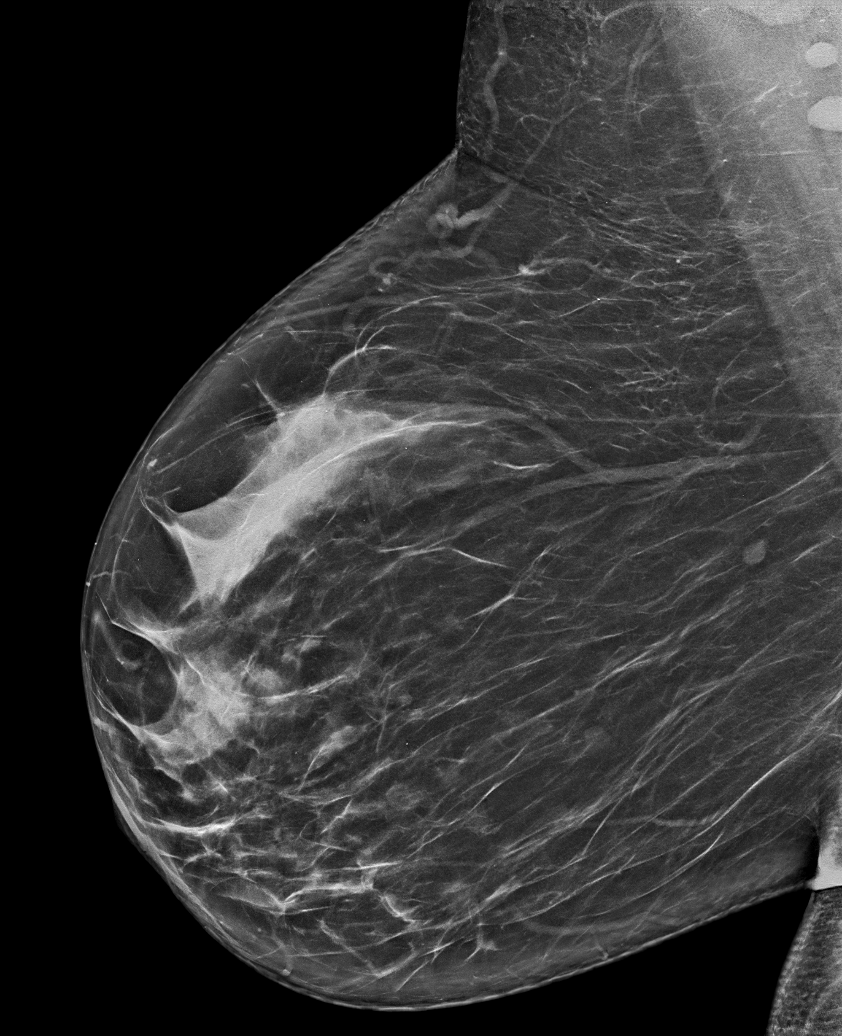

[R MLO tomo · tomo slice 43/85.0]
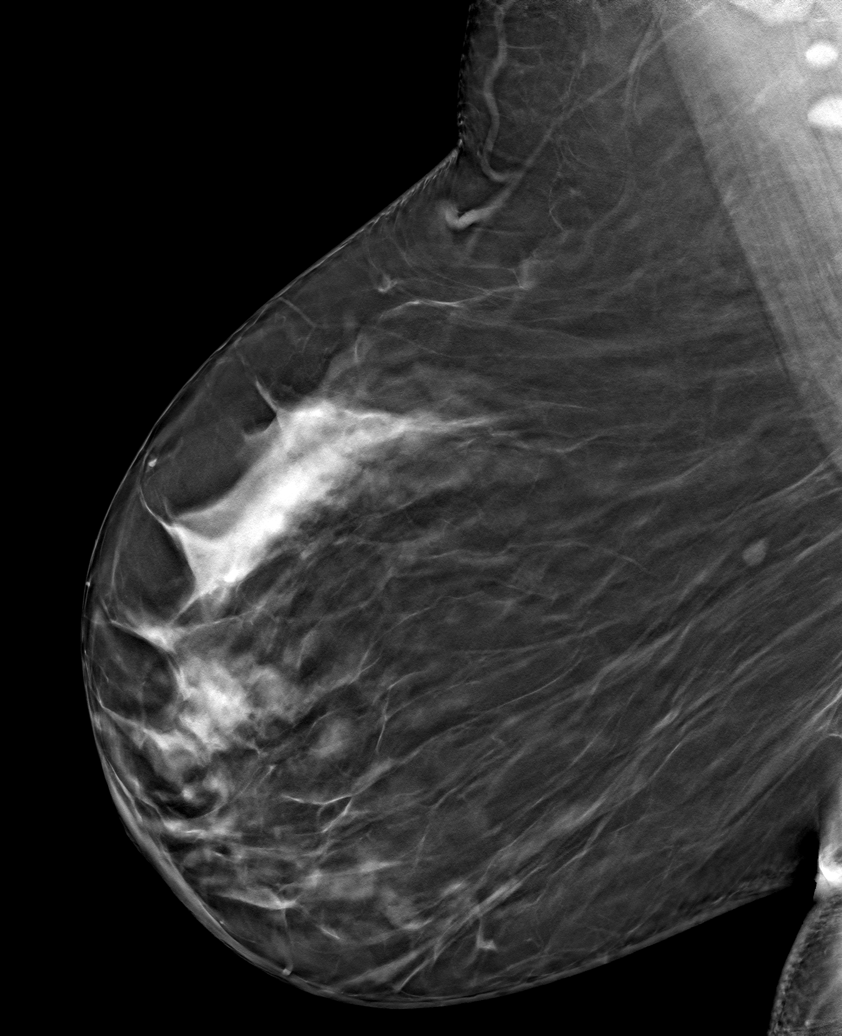

[6 of 30 positions shown; findings below may reference images not displayed]

ACR Breast Density Category c: The breast tissue is heterogeneously
dense, which may obscure small masses
FINDINGS: There are no findings suspicious for malignancy. Images were
processed with CAD.
IMPRESSION: No mammographic evidence of malignancy. A result letter of this
screening mammogram will be mailed directly to the patient.

RECOMMENDATION:
Screening mammogram in one year. (Code:EN-2-3B0)

BI-RADS CATEGORY  1: Negative.

## 2021-12-31 ENCOUNTER — Ambulatory Visit: Admit: 2021-12-31 | Discharge: 2022-01-01

## 2021-12-31 DIAGNOSIS — Z5181 Encounter for therapeutic drug level monitoring: Principal | ICD-10-CM

## 2021-12-31 DIAGNOSIS — M059 Rheumatoid arthritis with rheumatoid factor, unspecified: Principal | ICD-10-CM

## 2021-12-31 DIAGNOSIS — M542 Cervicalgia: Principal | ICD-10-CM

## 2021-12-31 DIAGNOSIS — Z7962 Encounter for monitoring of etanercept therapy: Principal | ICD-10-CM

## 2021-12-31 DIAGNOSIS — R202 Paresthesia of skin: Principal | ICD-10-CM

## 2022-04-27 ENCOUNTER — Ambulatory Visit: Payer: Self-pay

## 2022-04-27 NOTE — Telephone Encounter (Signed)
?  Chief Complaint: hyperglycemia ?Symptoms: BS 570 yesterday after eating spaghetti and 2 pieces of cheese bread  ?Frequency: been elevated for several weeks  ?Pertinent Negatives: Patient denies dizziness, headache ?Disposition: [] ED /[] Urgent Care (no appt availability in office) / [x] Appointment(In office/virtual)/ []  [facility]/ [] Home Care/ [] Refused Recommended Disposition /[] [facility]/ []  Follow-up with PCP ?Additional Notes: pt states she feels really fatigued and has got BS down to 268 this morning d/t drinking a lot of water but still wanted to see if she needed to come in. I scheduled her OV for 04/28/22 at 1140 with PCP. Also encouraged pt to drink lots of water today as well. Pt reported during call she has stopped taking the Jardiance about 4 days ago d/t it causing yeast infections and has been using the monistat.  ? ?Reason for Disposition ? [1] Blood glucose > 300 mg/dL (93.2 mmol/L) AND [3] two or more times in a row ? ?Answer Assessment - Initial Assessment Questions ?1. BLOOD GLUCOSE: "What is your blood glucose level?"  ?    268, 570 yesterday 1430 yesterday  ?2. ONSET: "When did you check the blood glucose?" ?    Yesterday and today ?3. USUAL RANGE: "What is your glucose level usually?" (e.g., usual fasting morning value, usual evening value) ?    Been running high in 200s ?5. TYPE 1 or 2:  "Do you know what type of diabetes you have?"  (e.g., Type 1, Type 2, Gestational; doesn't know)  ?    DM 2  ?6. INSULIN: "Do you take insulin?" "What type of insulin(s) do you use? What is the mode of delivery? (syringe, pen; injection or pump)?"  ?    Yes took 22u ?7. DIABETES PILLS: "Do you take any pills for your diabetes?" If yes, ask: "Have you missed taking any pills recently?" ?    Hadnt Jardiance taken 4 days  ?8. OTHER SYMPTOMS: "Do you have any symptoms?" (e.g., fever, frequent urination, difficulty breathing, dizziness, weakness, vomiting) ?    fatigue ? ?Protocols used:  Diabetes - High Blood Sugar-A-AH ? ?

## 2022-04-28 ENCOUNTER — Other Ambulatory Visit
Admission: RE | Admit: 2022-04-28 | Discharge: 2022-04-28 | Disposition: A | Discharge status: Home / Self Care | Payer: Self-pay | Attending: Family Medicine | Admitting: Family Medicine

## 2022-04-28 ENCOUNTER — Encounter: Payer: Self-pay | Admitting: Family Medicine

## 2022-04-28 ENCOUNTER — Ambulatory Visit: Payer: Self-pay | Admitting: Family Medicine

## 2022-04-28 ENCOUNTER — Telehealth: Payer: Self-pay | Admitting: *Deleted

## 2022-04-28 VITALS — BP 116/80 | HR 96 | Temp 97.8°F | Resp 18 | Ht 66.0 in | Wt 242.9 lb

## 2022-04-28 DIAGNOSIS — E1169 Type 2 diabetes mellitus with other specified complication: Secondary | ICD-10-CM | POA: Insufficient documentation

## 2022-04-28 DIAGNOSIS — E039 Hypothyroidism, unspecified: Secondary | ICD-10-CM | POA: Insufficient documentation

## 2022-04-28 DIAGNOSIS — N760 Acute vaginitis: Secondary | ICD-10-CM

## 2022-04-28 DIAGNOSIS — Z1211 Encounter for screening for malignant neoplasm of colon: Secondary | ICD-10-CM

## 2022-04-28 DIAGNOSIS — Z6838 Body mass index (BMI) 38.0-38.9, adult: Secondary | ICD-10-CM

## 2022-04-28 DIAGNOSIS — E1165 Type 2 diabetes mellitus with hyperglycemia: Secondary | ICD-10-CM | POA: Insufficient documentation

## 2022-04-28 DIAGNOSIS — Z8719 Personal history of other diseases of the digestive system: Secondary | ICD-10-CM

## 2022-04-28 DIAGNOSIS — Z5181 Encounter for therapeutic drug level monitoring: Secondary | ICD-10-CM | POA: Insufficient documentation

## 2022-04-28 DIAGNOSIS — Z20822 Contact with and (suspected) exposure to covid-19: Secondary | ICD-10-CM

## 2022-04-28 DIAGNOSIS — D84821 Immunodeficiency due to drugs: Secondary | ICD-10-CM | POA: Insufficient documentation

## 2022-04-28 DIAGNOSIS — Z79899 Other long term (current) drug therapy: Secondary | ICD-10-CM | POA: Insufficient documentation

## 2022-04-28 DIAGNOSIS — Z5989 Other problems related to housing and economic circumstances: Secondary | ICD-10-CM

## 2022-04-28 DIAGNOSIS — J4520 Mild intermittent asthma, uncomplicated: Secondary | ICD-10-CM

## 2022-04-28 DIAGNOSIS — E785 Hyperlipidemia, unspecified: Secondary | ICD-10-CM

## 2022-04-28 DIAGNOSIS — Z1231 Encounter for screening mammogram for malignant neoplasm of breast: Secondary | ICD-10-CM

## 2022-04-28 DIAGNOSIS — J329 Chronic sinusitis, unspecified: Secondary | ICD-10-CM

## 2022-04-28 DIAGNOSIS — J45901 Unspecified asthma with (acute) exacerbation: Secondary | ICD-10-CM

## 2022-04-28 LAB — LIPID PANEL
Cholesterol: 157 mg/dL (ref 0–200)
HDL: 44 mg/dL (ref >40)
LDL Cholesterol: 92 mg/dL (ref 0–99)
Total CHOL/HDL Ratio: 3.6 RATIO
Triglycerides: 107 mg/dL (ref <150)
VLDL: 21 mg/dL (ref 0–40)

## 2022-04-28 LAB — CBC WITH DIFFERENTIAL/PLATELET
Abs Immature Granulocytes: 0.04 10*3/uL (ref 0.00–0.07)
Basophils Absolute: 0.1 10*3/uL (ref 0.0–0.1)
Basophils Relative: 1 %
Eosinophils Absolute: 0.4 10*3/uL (ref 0.0–0.5)
Eosinophils Relative: 3 %
HCT: 42 % (ref 36.0–46.0)
Hemoglobin: 14.3 g/dL (ref 12.0–15.0)
Immature Granulocytes: 0 %
Lymphocytes Relative: 35 %
Lymphs Abs: 4.1 10*3/uL — ABNORMAL HIGH (ref 0.7–4.0)
MCH: 27.7 pg (ref 26.0–34.0)
MCHC: 34 g/dL (ref 30.0–36.0)
MCV: 81.2 fL (ref 80.0–100.0)
Monocytes Absolute: 0.7 10*3/uL (ref 0.1–1.0)
Monocytes Relative: 6 %
Neutro Abs: 6.6 10*3/uL (ref 1.7–7.7)
Neutrophils Relative %: 55 %
Platelets: 228 10*3/uL (ref 150–400)
RBC: 5.17 MIL/uL — ABNORMAL HIGH (ref 3.87–5.11)
RDW: 12.5 % (ref 11.5–15.5)
WBC: 12 10*3/uL — ABNORMAL HIGH (ref 4.0–10.5)
nRBC: 0 % (ref 0.0–0.2)

## 2022-04-28 LAB — COMPREHENSIVE METABOLIC PANEL
ALT: 27 U/L (ref 0–44)
AST: 28 U/L (ref 15–41)
Albumin: 3.3 g/dL — ABNORMAL LOW (ref 3.5–5.0)
Alkaline Phosphatase: 98 U/L (ref 38–126)
Anion gap: 6 (ref 5–15)
BUN: 11 mg/dL (ref 6–20)
CO2: 25 mmol/L (ref 22–32)
Calcium: 8.7 mg/dL — ABNORMAL LOW (ref 8.9–10.3)
Chloride: 103 mmol/L (ref 98–111)
Creatinine, Ser: 0.48 mg/dL (ref 0.44–1.00)
GFR, Estimated: >60 mL/min (ref >60)
Glucose, Bld: 211 mg/dL — ABNORMAL HIGH (ref 70–99)
Potassium: 3.9 mmol/L (ref 3.5–5.1)
Sodium: 134 mmol/L — ABNORMAL LOW (ref 135–145)
Total Bilirubin: 0.7 mg/dL (ref 0.3–1.2)
Total Protein: 7.5 g/dL (ref 6.5–8.1)

## 2022-04-28 LAB — TSH: TSH: 4.542 u[IU]/mL — ABNORMAL HIGH (ref 0.350–4.500)

## 2022-04-28 MED ORDER — BUDESONIDE-FORMOTEROL FUMARATE 80-4.5 MCG/ACT IN AERO: 2.0000 | INHALATION_SPRAY | Freq: Two times a day (BID) | RESPIRATORY_TRACT | 1 refills | Status: AC

## 2022-04-28 MED ORDER — LEVOTHYROXINE SODIUM 175 MCG PO TABS: 175.0000 ug | ORAL_TABLET | Freq: Every day | ORAL | 3 refills | Status: AC

## 2022-04-28 MED ORDER — MONTELUKAST SODIUM 10 MG PO TABS: ORAL_TABLET | ORAL | 3 refills | Status: AC

## 2022-04-28 MED ORDER — ALBUTEROL SULFATE HFA 108 (90 BASE) MCG/ACT IN AERS: 2.0000 | INHALATION_SPRAY | Freq: Four times a day (QID) | RESPIRATORY_TRACT | 1 refills | Status: DC | PRN

## 2022-04-28 MED ORDER — FLUCONAZOLE 150 MG PO TABS: 150.0000 mg | ORAL_TABLET | ORAL | 1 refills | Status: AC | PRN

## 2022-04-28 MED ORDER — OMEPRAZOLE 20 MG PO CPDR: 20.0000 mg | DELAYED_RELEASE_CAPSULE | Freq: Every day | ORAL | 3 refills | Status: AC

## 2022-04-28 MED ORDER — LISINOPRIL 2.5 MG PO TABS: 2.5000 mg | ORAL_TABLET | Freq: Every day | ORAL | 1 refills | Status: AC

## 2022-04-28 MED ORDER — ATORVASTATIN CALCIUM 10 MG PO TABS: 10.0000 mg | ORAL_TABLET | Freq: Every day | ORAL | 3 refills | Status: AC

## 2022-04-28 NOTE — Progress Notes (Signed)
? ?Name: Ashley Cochran   MRN: [id number]    DOB: Mar 05, 1976   Date:04/28/2022 ? ?     Progress Note ? ?Chief Complaint  ?Patient presents with  ? Diabetes  ? ? ? ?Subjective:  ? ?Ashley Cochran is a 46 y.o. female, presents to clinic for hyperglycemia - called in yesterday and CBG was 570 after eating spaghetti and breads - IDDM ?Last seen July 2022  ?Unknown how much insulin she has been taking or if she has meds at all. ? ?Out of levothyroxine a month - was on 175  mcg dose daily ?She is cash pay, has no insurance, and getting medications and keeping f/up has been difficult.  I have seen pt once or twice in the past1-2 years after PCP left the area.  She will do meds initially when samples are given, but then they run out and we have not been successful at helping her get est elsewhere with sliding scale, or med assistance with CCM pharmacy or SW, or to [facility].   ? ?Prior visit (interval hx added in bold)  ?Ashley Cochran is a 46 y.o. female, presents to clinic for routine f/up on severe uncontrolled chronic conditions, pt has been non-compliant with f/up and with meds ?  ?Pt still new to me - though I am documented at PCP for some time -she did 1 virtual visit last year in July and has not come in person to establish care and to address chronic conditions ?Last in person and office visit was with her last PCP Maurice Small over a year ago January 2021 ?She saw Dr. Dorris Fetch a few times for acute issues with back pain or refills for asthma medications with exacerbation ?  ?She is here to follow-up on that and other uncontrolled chronic conditions ?  ?  ?Asthma- out of inhalers ?meds recently refilled with OV with Dr. Rexene Edison - here for f/up ?On singulair, symbicort, albuterol rescue inhaler ?She reports using albuterol only 1 time a week on average she is not having any nighttime waking or worsening asthma symptoms she has some minor limitations with normal activity throughout the days and week especially  with more strenuous physical work at her job in the past year she has had about 2-3 asthma exacerbations requiring steroids but this was secondary to Covid and other year she has not required as many systemic steroid treatments ?  ?IDDM,  ?Using meds prn in the past  ?uncontrolled not on insulin currently due to cost?noncompliance ?DM:   ?Only on jardiance -she previously reported to my colleague that her blood sugars were in excess of 200-500 brought down a significant amount by taking Jardiance she was previously on Victoza, [place] and previously could not tolerate any forms of Metformin at all. ?She brings in a picture of a medication asks about possibly trying [place] ?Blood sugars very elevated not checking very often ?She endorses polyuria, polydipsia, unintentional weight loss, neuropathy ?Last A1c January of last year patient was on all of her medications ?For most of the last year she has not been on Victoza or [place] ?Recent pertinent labs: ?Recent Labs  ?     ?Lab Results  ?Component Value Date  ?  HGBA1C 8.1 (H) 12/28/2019  ?  HGBA1C 10.1 (H) 06/20/2019  ?  HGBA1C 9.5 (H) 12/08/2018  ?  ?  ?Standard of care and health maintenance: ?Urine Microalbumin:  due ?Foot exam:  due ?DM eye exam:  due ?ACEI/ARB:  Not  taking ?Statin:  Not taking ?  ?She states that she has never been put on an ACE or an ARB for renal protection and has never taken a statin and additionally states she has never had trouble with her blood pressure or cholesterol ?  ?Hypothyroid- on levothyroxine 175 mcg daily ?Recent Labs  ?     ?Lab Results  ?Component Value Date  ?  TSH 3.07 12/28/2019  ?  ?  ?Obesity - weight stable compared to last year ?weight down  ?   ?Wt Readings from Last 5 Encounters:  ?01/23/21 243 lb 9.6 oz (110.5 kg)  ?08/19/20 249 lb 9.6 oz (113.2 kg)  ?06/21/20 250 lb (113.4 kg)  ?05/17/20 250 lb (113.4 kg)  ?12/28/19 252 lb 1.6 oz (114.4 kg)  ?  ?   ?BMI Readings from Last 5 Encounters:  ?01/23/21  38.15 kg/m?  ?08/19/20 39.09 kg/m?  ?06/21/20 39.16 kg/m?  ?05/17/20 40.35 kg/m?  ?12/28/19 40.69 kg/m?  ?  ?GERD - unchanged - dependent on ppi, uses daily and managed sx, but severe sx if out of meds ?severe, needs PPI every day, severe sx if she doesn't take ?  ?Chronic back pain she has previously been prescribed baclofen, Flexeril, she uses naproxen, and there is also tizanidine on the chart ?  ?History of insomnia previously using trazodone she states she is currently not taking this ?  ?Also in the chart there is Compazine, Phenergan, Prilosec and ondansetron ?  ?She has history of RA is seeing a specialist at [facility] and is on Enbrel injections ?  ?  ? ? ?Current Outpatient Medications:  ?  albuterol (PROVENTIL) (2.5 MG/3ML) 0.083% nebulizer solution, Take 3 mLs (2.5 mg total) by nebulization every 6 (six) hours as needed for wheezing or shortness of breath., Disp: 100 mL, Rfl: 1 ?  albuterol (VENTOLIN HFA) 108 (90 Base) MCG/ACT inhaler, Inhale 2 puffs into the lungs every 6 (six) hours as needed for wheezing or shortness of breath., Disp: 6.7 g, Rfl: 1 ?  atorvastatin (LIPITOR) 10 MG tablet, Take 1 tablet (10 mg total) by mouth at bedtime., Disp: 90 tablet, Rfl: 3 ?  budesonide-formoterol (SYMBICORT) 80-4.5 MCG/ACT inhaler, Inhale 2 puffs into the lungs 2 (two) times daily., Disp: 6.9 g, Rfl: 1 ?  Continuous Blood Gluc Sensor (FREESTYLE LIBRE 2 SENSOR) MISC, 1 each by Does not apply route every 14 (fourteen) days., Disp: 4 each, Rfl: 3 ?  empagliflozin (JARDIANCE) 25 MG TABS tablet, Take 1 tablet (25 mg total) by mouth daily., Disp: 90 tablet, Rfl: 3 ?  ENBREL SURECLICK 50 MG/ML injection, Inject into the skin., Disp: , Rfl:  ?  fluticasone (FLONASE) 50 MCG/ACT nasal spray, Place 2 sprays into both nostrils daily., Disp: 16 g, Rfl: 6 ?  insulin degludec (TRESIBA FLEXTOUCH) 100 UNIT/ML FlexTouch Pen, Inject 10-20 Units into the skin daily., Disp: 6 mL, Rfl: 0 ?  Insulin Pen Needle 29G X MISC, Use as directed  with insulin daily, Disp: 100 each, Rfl: 1 ?  levothyroxine (SYNTHROID) 175 MCG tablet, Take 1 tablet (175 mcg total) by mouth daily before breakfast., Disp: 90 tablet, Rfl: 3 ?  lisinopril (ZESTRIL) 2.5 MG tablet, Take 1 tablet (2.5 mg total) by mouth daily., Disp: 90 tablet, Rfl: 1 ?  montelukast (SINGULAIR) 10 MG tablet, TAKE ONE TABLET BY MOUTH ONCE DAILY FOR ASTHMA, Disp: 90 tablet, Rfl: 3 ?  naproxen (NAPROSYN) 500 MG tablet, naproxen 500 mg tablet  Take 1 tablet twice a day  by oral route., Disp: , Rfl:  ?  omeprazole (PRILOSEC) 20 MG capsule, Take 1 capsule (20 mg total) by mouth daily. Take more than 4 hours after taking morning levothyroxine (may also take at bedtime), Disp: 90 capsule, Rfl: 3 ? ?Patient Active Problem List  ? Diagnosis Date Noted  ? Mild persistent asthma without complication 01/23/2021  ? Immunocompromised state due to drug therapy (HCC) 01/23/2021  ? Patient's noncompliance with other medical treatment and regimen 01/23/2021  ? Chest pain 01/23/2021  ? Acute bilateral low back pain without sciatica 08/19/2020  ? H/O gastroesophageal reflux (GERD) 06/21/2020  ? Class 2 severe obesity with serious comorbidity and body mass index (BMI) of 38.0 to 38.9 in adult [facility]) 01/26/2019  ? Insulin dependent type 2 diabetes mellitus, uncontrolled 01/26/2019  ? Dyslipidemia associated with type 2 diabetes mellitus (HCC) 01/26/2019  ? Hypothyroidism 04/09/2015  ? Rheumatoid arthritis (HCC) 04/09/2015  ? ? ?No past surgical history on file. ? ?Family History  ?Problem Relation Age of Onset  ? Hypertension Mother   ? Diabetes Mother   ?     type 2  ? Asthma Mother   ? Stroke Mother   ?     Mini strokes  ? GER disease Mother   ? Hypertension Father   ? Diabetes Father   ?     Type 2  ? Asthma Father   ? GER disease Father   ? Asthma Sister   ? Allergies Sister   ? Asthma Brother   ? Allergies Brother   ? ? ?Social History  ? ?Tobacco Use  ? Smoking status: Former  ?  Types: Cigarettes  ?  Quit date:  09/20/2004  ?  Years since quitting: 17.6  ? Smokeless tobacco: Never  ?Vaping Use  ? Vaping Use: Never used  ?Substance Use Topics  ? Alcohol use: No  ? Drug use: No  ?  ? ?Allergies  ?Allergen Reactions  ? Ni

## 2022-04-28 NOTE — Chronic Care Management (AMB) (Signed)
?  Care Management  ? ?Note ? ?04/28/2022 ?Name: Ashley Cochran MRN: [id number] DOB: 1976-11-26 ? ?[name] is a 46 y.o. year old female who is a primary care patient of Danelle Berry, [place]. I reached out to [name] by phone today offer care coordination services.  ? ?[name] was given information about care management services today including:  ?Care management services include personalized support from designated clinical staff supervised by her physician, including individualized plan of care and coordination with other care providers ?24/7 contact phone numbers for assistance for urgent and routine care needs. ?The patient may stop care management services at any time by phone call to the office staff. ? ?Patient agreed to services and verbal consent obtained.  ? ?Follow up plan: ?Telephone appointment with care management team member scheduled for: 05/01/2022 ? ?[name], CCMA ?Care Guide, Embedded Care Coordination ?[place]  Care Management  ?Direct Dial: [phone] ? ? ?

## 2022-04-29 LAB — HEMOGLOBIN A1C
Hgb A1c MFr Bld: 10.8 % — ABNORMAL HIGH (ref 4.8–5.6)
Mean Plasma Glucose: 263.26 mg/dL

## 2022-05-01 ENCOUNTER — Ambulatory Visit: Payer: Self-pay | Admitting: *Deleted

## 2022-05-01 DIAGNOSIS — E039 Hypothyroidism, unspecified: Secondary | ICD-10-CM

## 2022-05-01 DIAGNOSIS — E1165 Type 2 diabetes mellitus with hyperglycemia: Secondary | ICD-10-CM

## 2022-05-01 DIAGNOSIS — E1169 Type 2 diabetes mellitus with other specified complication: Secondary | ICD-10-CM

## 2022-05-01 NOTE — Patient Instructions (Signed)
Visit Information ? ?Thank you for taking time to visit with me today. Please don't hesitate to contact me if I can be of assistance to you before our next scheduled telephone appointment. ? ?Following are the goals we discussed today:  ? ?- begin a notebook of services in my neighborhood or community ?- call 211 when I need some help ?- follow-up on any referrals for help I am given ?- think ahead to make sure my need does not become an emergency ?- have a back-up plan ?- make a list of family or friends that I can call  ?-Plan to meet CM for face to face visit to assist with application for [facility] and [facility] ? ?Our next appointment is in person at [facility] on 05/04/22 at 11am ? ?Please call the care guide team at [phone] if you need to cancel or reschedule your appointment.  ? ?If you are experiencing a Mental Health or Behavioral Health Crisis or need someone to talk to, please call the Suicide and Crisis Lifeline: 988  ? ?Following is a copy of your full plan of care:  ?Care Plan : General Social Work (Adult)  ?Updates made by Wenda Overland, LCSW since 05/01/2022 12:00 AM  ?  ? ?Problem: [facility] AMB "PATIENT-SPECIFIC PROBLEM"   ?Priority: Medium  ?Note:   ?Current Barriers:  ?Patient currently unemployed/uninsured  and needs specialist follow up ?Suicidal Ideation/Homicidal Ideation: No ? ?Clinical Social Work Goal(s):  ?Over the next 90 days, patient will work with SW bi-weekly by telephone or in person to complete application for [facility] and [facility] ? ?Interventions: ?SDOH Interventions   ? ?Flowsheet Row Most Recent Value  ?SDOH Interventions   ?SDOH Interventions for the Following Domains Financial Strain  ?Financial Strain Interventions Other (Comment)  [CSW to assist with completion of charity care applications]  ? ?  ?Confirmed that patient is currently uninsured however needs medical follow up and specialist care ?Confirmed that [facility] will not follow up with patient if she has any type of coverage. Patient agreeable to follow up with the [facility] and completion of the [facility] paperwork ?Patient confirmed plan to meet this social worker to complete paperwork due to challenges encountered ?Patient assessed for additional commununity resource needs with patient verbalizing no additional needs at this time ?Emotional Support and reassurance provided-will plan to meet patient in the office on 05/04/22 to completed needed paperwork for medical  ? ?Patient Self Care Activities:  ?Performs ADL's independently ?Performs IADL's independently ? ?Patient Coping Strengths:  ?Supportive Relationships ?Family ?Able to Communicate Effectively ? ?Patient Self Care Deficits:  ?Minimal insurance coverage ? ?Initial goal documentation ? ?  ? ? ?Ms. Burnside was given information about Care Management services by the embedded care coordination team including:  ?Care Management services include personalized support from designated clinical staff supervised by her physician, including individualized plan of care and coordination with other care providers ?24/7 contact phone numbers for assistance for urgent and routine care needs. ?The patient may stop CCM services at any time (effective at the end of the month) by phone call to the office staff. ? ?Patient agreed to services and verbal consent obtained.  ? ?Patient verbalizes understanding of instructions and care plan provided today and agrees to view in MyChart. Active MyChart status confirmed with patient.   ? ?Face to Face appointment with care management team member scheduled for: 05/04/22 ? ?  Kaylah Chiasson, LCSW ?Clinical Social Worker  ?[facility] Care Management ?[phone] ? ? ?  ?

## 2022-05-01 NOTE — Chronic Care Management (AMB) (Signed)
Care Management ?Clinical Social Work Note ? ?05/01/2022 ?Name: Makynlie Rossini MRN: [id number] DOB: Mar 23, 1976 ? ?Kaiah Hosea is a 46 y.o. year old female who is a primary care patient of Danelle Berry, [place].  The Care Management team was consulted for assistance with chronic disease management and coordination needs. ? ?Engaged with patient by telephone for follow up visit in response to provider referral for social work chronic care management and care coordination services ? ?Consent to Services:  ?Ms. Slager was given information about Care Management services today including:  ?Care Management services includes personalized support from designated clinical staff supervised by her physician, including individualized plan of care and coordination with other care providers ?24/7 contact phone numbers for assistance for urgent and routine care needs. ?The patient may stop case management services at any time by phone call to the office staff. ? ?Patient agreed to services and consent obtained.  ? ?Assessment: Review of patient past medical history, allergies, medications, and health status, including review of relevant consultants reports was performed today as part of a comprehensive evaluation and provision of chronic care management and care coordination services. ? ?SDOH (Social Determinants of Health) assessments and interventions performed:  ?SDOH Interventions   ? ?Flowsheet Row Most Recent Value  ?SDOH Interventions   ?SDOH Interventions for the Following Domains Financial Strain  ?Financial Strain Interventions Other (Comment)  [CSW to assist with completion of charity care applications]  ? ?  ?  ? ?Advanced Directives Status: Not addressed in this encounter. ? ?Care Plan ? ?Allergies  ?Allergen Reactions  ? Nitrofuran Derivatives Rash  ?  jittery  ? Latex   ? Ranitidine Hcl   ?   Causes jitters  ? Strawberry Flavor   ? Metformin And Related Diarrhea  ?  Could not tolerate normal and XR metformin, severe  GI cramping and diarrhea   ? Tape Rash  ? ? ?Outpatient Encounter Medications as of 05/01/2022  ?Medication Sig  ? albuterol (PROVENTIL) (2.5 MG/3ML) 0.083% nebulizer solution Take 3 mLs (2.5 mg total) by nebulization every 6 (six) hours as needed for wheezing or shortness of breath.  ? albuterol (VENTOLIN HFA) 108 (90 Base) MCG/ACT inhaler Inhale 2 puffs into the lungs every 6 (six) hours as needed for wheezing or shortness of breath.  ? atorvastatin (LIPITOR) 10 MG tablet Take 1 tablet (10 mg total) by mouth at bedtime.  ? budesonide-formoterol (SYMBICORT) 80-4.5 MCG/ACT inhaler Inhale 2 puffs into the lungs 2 (two) times daily.  ? Continuous Blood Gluc Sensor (FREESTYLE LIBRE 2 SENSOR) MISC 1 each by Does not apply route every 14 (fourteen) days. (Patient not taking: Reported on 04/28/2022)  ? ENBREL SURECLICK 50 MG/ML injection Inject into the skin.  ? fluconazole (DIFLUCAN) 150 MG tablet Take 1 tablet (150 mg total) by mouth every 3 (three) days as needed (for vaginal itching/yeast infection sx).  ? fluticasone (FLONASE) 50 MCG/ACT nasal spray Place 2 sprays into both nostrils daily.  ? Insulin Pen Needle 29G X MISC Use as directed with insulin daily  ? levothyroxine (SYNTHROID) 175 MCG tablet Take 1 tablet (175 mcg total) by mouth daily before breakfast.  ? lisinopril (ZESTRIL) 2.5 MG tablet Take 1 tablet (2.5 mg total) by mouth daily.  ? montelukast (SINGULAIR) 10 MG tablet TAKE ONE TABLET BY MOUTH ONCE DAILY FOR ASTHMA  ? naproxen (NAPROSYN) 500 MG tablet naproxen 500 mg tablet ? Take 1 tablet twice a day by oral route.  ? naproxen (NAPROSYN) 500 MG tablet Take  by mouth.  ? omeprazole (PRILOSEC) 20 MG capsule Take 1 capsule (20 mg total) by mouth daily. Take more than 4 hours after taking morning levothyroxine (may also take at bedtime)  ? ?No facility-administered encounter medications on file as of 05/01/2022.  ? ? ?Patient Active Problem List  ? Diagnosis Date Noted  ? Mild persistent asthma without  complication 01/23/2021  ? Immunocompromised state due to drug therapy (HCC) 01/23/2021  ? Patient's noncompliance with other medical treatment and regimen 01/23/2021  ? Chest pain 01/23/2021  ? Acute bilateral low back pain without sciatica 08/19/2020  ? H/O gastroesophageal reflux (GERD) 06/21/2020  ? Class 2 severe obesity with serious comorbidity and body mass index (BMI) of 38.0 to 38.9 in adult [facility]) 01/26/2019  ? Insulin dependent type 2 diabetes mellitus, uncontrolled 01/26/2019  ? Dyslipidemia associated with type 2 diabetes mellitus (HCC) 01/26/2019  ? Hypothyroidism 04/09/2015  ? Rheumatoid arthritis (HCC) 04/09/2015  ? ? ?Conditions to be addressed/monitored:  ?DMII; Financial constraints related to lack of insurance, job loss ?Care Plan : General Social Work (Adult)  ?Updates made by Wenda Overland, LCSW since 05/01/2022 12:00 AM  ?  ? ?Problem: [facility] AMB "PATIENT-SPECIFIC PROBLEM"   ?Priority: Medium  ?Note:   ?Current Barriers:  ?Patient currently unemployed/uninsured  and needs specialist follow up ?Suicidal Ideation/Homicidal Ideation: No ? ?Clinical Social Work Goal(s):  ?Over the next 90 days, patient will work with SW bi-weekly by telephone or in person to complete application [facility] and [facility] ? ?Interventions: ?SDOH Interventions   ? ?Flowsheet Row Most Recent Value  ?SDOH Interventions   ?SDOH Interventions for the Following Domains Financial Strain  ?Financial Strain Interventions Other (Comment)  [CSW to assist with completion of charity care applications]  ? ?  ?Confirmed that patient is currently uninsured however needs medical follow up and specialist care ?Confirmed that [facility] will not follow up with patient if she has any type of coverage. Patient agreeable to follow up with the [facility] and completion of the [facility] paperwork ?Patient confirmed plan to meet this social worker to complete paperwork due to challenges  encountered ?Patient assessed for additional commununity resource needs with patient verbalizing no additional needs at this time ?Emotional Support and reassurance provided-will plan to meet patient in the office on 05/04/22 to completed needed paperwork for medical  ? ?Patient Self Care Activities:  ?Performs ADL's independently ?Performs IADL's independently ? ?Patient Coping Strengths:  ?Supportive Relationships ?Family ?Able to Communicate Effectively ? ?Patient Self Care Deficits:  ?Minimal insurance coverage ? ?Initial goal documentation ? ?  ?  ? ?Follow Up Plan: SW will follow up with patient by phone over the next 7 business days ? ?Luan Urbani, LCSW ?Clinical Social Worker  ?[facility] Care Management ?[phone] ? ?

## 2022-05-04 ENCOUNTER — Ambulatory Visit: Payer: Self-pay

## 2022-05-06 DIAGNOSIS — J4520 Mild intermittent asthma, uncomplicated: Secondary | ICD-10-CM | POA: Insufficient documentation

## 2022-05-06 DIAGNOSIS — E1165 Type 2 diabetes mellitus with hyperglycemia: Secondary | ICD-10-CM | POA: Insufficient documentation

## 2022-05-06 NOTE — Assessment & Plan Note (Signed)
Dependent on long term PPI  ?

## 2022-05-06 NOTE — Assessment & Plan Note (Signed)
Controlled with maintenance inhaler and singulair ?No current wheeze or recent exacerbation ?Refill sent in for all of her medications -she is cash pay encouraged good Rx ?

## 2022-05-06 NOTE — Assessment & Plan Note (Signed)
Patient previously on very high doses of levothyroxine she has been out of medications for 1 month ?She denies severely depressed mood, constipation, hair loss, weight gain, fatigue, slowed cognition ?We will recheck TSH and restart meds -she may need a lower dose to keep her euthyroid ?

## 2022-05-06 NOTE — Assessment & Plan Note (Signed)
Pt needs to start statin, explained benefit with IDDM  ?

## 2022-05-06 NOTE — Assessment & Plan Note (Signed)
Multiple barriers to care -no insurance, expensive drug prices, and I suspect some need for education -I spent an extensive amount of time with the patient today explaining basal insulin, how it works in the body, how to adjust the doses, how to monitor blood sugars, we also addressed diet efforts ?Patient cannot tolerate metformin at all ?She is having recurrent vaginal yeast infection she may not do well with SGLT2 ?Pt given long acting insulin samples here - restart dose once daily starting with 20 units - titration details reviewed ?Close f/up ?Will need med assistance to get Rx filled  ?Since glucose was >500 yesterday will get stat labs at the outpt lab  ?

## 2022-05-12 ENCOUNTER — Ambulatory Visit (INDEPENDENT_AMBULATORY_CARE_PROVIDER_SITE_OTHER): Payer: Self-pay | Admitting: Family Medicine

## 2022-05-12 ENCOUNTER — Other Ambulatory Visit: Payer: Self-pay

## 2022-05-12 ENCOUNTER — Encounter: Payer: Self-pay | Admitting: Family Medicine

## 2022-05-12 VITALS — BP 122/78 | HR 90 | Temp 97.8°F | Resp 18 | Ht 66.0 in | Wt 236.9 lb

## 2022-05-12 DIAGNOSIS — Z79899 Other long term (current) drug therapy: Secondary | ICD-10-CM

## 2022-05-12 DIAGNOSIS — J310 Chronic rhinitis: Secondary | ICD-10-CM

## 2022-05-12 DIAGNOSIS — D84821 Immunodeficiency due to drugs: Secondary | ICD-10-CM

## 2022-05-12 DIAGNOSIS — Z1211 Encounter for screening for malignant neoplasm of colon: Secondary | ICD-10-CM

## 2022-05-12 DIAGNOSIS — J329 Chronic sinusitis, unspecified: Secondary | ICD-10-CM

## 2022-05-12 MED ORDER — DOXYCYCLINE HYCLATE 100 MG PO TABS: 100.0000 mg | ORAL_TABLET | Freq: Two times a day (BID) | ORAL | 0 refills | Status: AC

## 2022-05-12 NOTE — Progress Notes (Signed)
Patient ID: Ashley Cochran, female    DOB: 1976-01-03, 46 y.o.   MRN: [id number]  PCP: Ashley Berry, PA-C  Chief Complaint  Patient presents with   Sinusitis   Cough    Subjective:   Analycia Cochran is a 46 y.o. female, presents to clinic with CC of the following:  HPI  URI sx sore throat, ears hurting Nasal congestion, sinus pain pressure Cough - productive  No fever  On Enbrel Everyone at home is sick as well Shes tried Robitussin alka seltzer without much improvement of sx Using inhalers and nebs, helps with breathing but not other sx On singulair Sx ongoing for 4 d   Uncontrolled DM - she is using insulin samples Denies HA, neck pain, myalgias, sweats, DOE, CP, weakness    Patient Active Problem List   Diagnosis Date Noted   Uncontrolled type 2 diabetes mellitus with hyperglycemia (HCC) 05/06/2022   Mild intermittent asthma without complication 05/06/2022   Immunocompromised state due to drug therapy (HCC) 01/23/2021   Patient's noncompliance with other medical treatment and regimen 01/23/2021   Chest pain 01/23/2021   Acute bilateral low back pain without sciatica 08/19/2020   H/O gastroesophageal reflux (GERD) 06/21/2020   Class 2 severe obesity with serious comorbidity and body mass index (BMI) of 38.0 to 38.9 in adult (HCC) 01/26/2019   Insulin dependent type 2 diabetes mellitus, uncontrolled 01/26/2019   Dyslipidemia associated with type 2 diabetes mellitus (HCC) 01/26/2019   Hypothyroidism 04/09/2015   Rheumatoid arthritis (HCC) 04/09/2015      Current Outpatient Medications:    albuterol (PROVENTIL) (2.5 MG/3ML) 0.083% nebulizer solution, Take 3 mLs (2.5 mg total) by nebulization every 6 (six) hours as needed for wheezing or shortness of breath., Disp: 100 mL, Rfl: 1   albuterol (VENTOLIN HFA) 108 (90 Base) MCG/ACT inhaler, Inhale 2 puffs into the lungs every 6 (six) hours as needed for wheezing or shortness of breath., Disp: 6.7 g, Rfl: 1    atorvastatin (LIPITOR) 10 MG tablet, Take 1 tablet (10 mg total) by mouth at bedtime., Disp: 90 tablet, Rfl: 3   budesonide-formoterol (SYMBICORT) 80-4.5 MCG/ACT inhaler, Inhale 2 puffs into the lungs 2 (two) times daily., Disp: 6.9 g, Rfl: 1   ENBREL SURECLICK 50 MG/ML injection, Inject into the skin., Disp: , Rfl:    fluconazole (DIFLUCAN) 150 MG tablet, Take 1 tablet (150 mg total) by mouth every 3 (three) days as needed (for vaginal itching/yeast infection sx)., Disp: 2 tablet, Rfl: 1   fluticasone (FLONASE) 50 MCG/ACT nasal spray, Place 2 sprays into both nostrils daily., Disp: 16 g, Rfl: 6   Insulin Pen Needle 29G X MISC, Use as directed with insulin daily, Disp: 100 each, Rfl: 1   levothyroxine (SYNTHROID) 175 MCG tablet, Take 1 tablet (175 mcg total) by mouth daily before breakfast., Disp: 90 tablet, Rfl: 3   lisinopril (ZESTRIL) 2.5 MG tablet, Take 1 tablet (2.5 mg total) by mouth daily., Disp: 90 tablet, Rfl: 1   montelukast (SINGULAIR) 10 MG tablet, TAKE ONE TABLET BY MOUTH ONCE DAILY FOR ASTHMA, Disp: 90 tablet, Rfl: 3   naproxen (NAPROSYN) 500 MG tablet, naproxen 500 mg tablet  Take 1 tablet twice a day by oral route., Disp: , Rfl:    naproxen (NAPROSYN) 500 MG tablet, Take by mouth., Disp: , Rfl:    omeprazole (PRILOSEC) 20 MG capsule, Take 1 capsule (20 mg total) by mouth daily. Take more than 4 hours after taking morning levothyroxine (may also take  at bedtime), Disp: 90 capsule, Rfl: 3   Continuous Blood Gluc Sensor (FREESTYLE LIBRE 2 SENSOR) MISC, 1 each by Does not apply route every 14 (fourteen) days. (Patient not taking: Reported on 05/12/2022), Disp: 4 each, Rfl: 3   Allergies  Allergen Reactions   Nitrofuran Derivatives Rash    jittery   Latex    Ranitidine Hcl      Causes jitters   Strawberry Flavor    Metformin And Related Diarrhea    Could not tolerate normal and XR metformin, severe GI cramping and diarrhea    Tape Rash     Social History   Tobacco Use    Smoking status: Former    Types: Cigarettes    Quit date: 09/20/2004    Years since quitting: 17.6   Smokeless tobacco: Never  Vaping Use   Vaping Use: Never used  Substance Use Topics   Alcohol use: No   Drug use: No      Chart Review Today: I personally reviewed active problem list, medication list, allergies, family history, social history, health maintenance, notes from last encounter, lab results, imaging with the patient/caregiver today.   Review of Systems  Constitutional: Negative.   HENT: Negative.    Eyes: Negative.   Respiratory: Negative.    Cardiovascular: Negative.   Gastrointestinal: Negative.   Endocrine: Negative.   Genitourinary: Negative.   Musculoskeletal: Negative.   Skin: Negative.   Allergic/Immunologic: Negative.   Neurological: Negative.   Hematological: Negative.   Psychiatric/Behavioral: Negative.    All other systems reviewed and are negative.     Objective:   Vitals:   05/12/22 1352  BP: 122/78  Pulse: 90  Resp: 18  Temp: 97.8 F (36.6 C)  TempSrc: Oral  SpO2: 100%  Weight: 236 lb 14.4 oz (107.5 kg)  Height: 5\' 6"  (1.676 m)    Body mass index is 38.24 kg/m.  Physical Exam Vitals and nursing note reviewed.  Constitutional:      General: She is not in acute distress.    Appearance: Normal appearance. She is well-developed. She is obese. She is not ill-appearing, toxic-appearing or diaphoretic.  HENT:     Head: Normocephalic and atraumatic.     Right Ear: Hearing, tympanic membrane, ear canal and external ear normal. There is no impacted cerumen.     Left Ear: Hearing, tympanic membrane, ear canal and external ear normal. There is no impacted cerumen.     Nose: Mucosal edema, congestion and rhinorrhea present.     Right Sinus: No maxillary sinus tenderness or frontal sinus tenderness.     Left Sinus: No maxillary sinus tenderness or frontal sinus tenderness.     Mouth/Throat:     Mouth: Mucous membranes are moist. Mucous  membranes are not pale.     Pharynx: Oropharynx is clear. Uvula midline. Posterior oropharyngeal erythema present. No oropharyngeal exudate or uvula swelling.     Tonsils: No tonsillar abscesses.  Eyes:     General: No scleral icterus.       Right eye: No discharge.        Left eye: No discharge.     Conjunctiva/sclera: Conjunctivae normal.  Neck:     Trachea: No tracheal deviation.  Cardiovascular:     Rate and Rhythm: Normal rate and regular rhythm.     Pulses: Normal pulses.     Heart sounds: Normal heart sounds. No murmur heard.   No friction rub. No gallop.  Pulmonary:     Effort: Pulmonary effort  is normal. No respiratory distress.     Breath sounds: No stridor. No wheezing, rhonchi or rales.  Abdominal:     General: Bowel sounds are normal. There is no distension.     Palpations: Abdomen is soft.  Musculoskeletal:        General: Normal range of motion.     Cervical back: Normal range of motion and neck supple.  Lymphadenopathy:     Cervical: No cervical adenopathy.  Skin:    General: Skin is warm and dry.     Coloration: Skin is not jaundiced or pale.     Findings: No bruising or rash.  Neurological:     Mental Status: She is alert. Mental status is at baseline.     Motor: No abnormal muscle tone.     Gait: Gait normal.  Psychiatric:        Mood and Affect: Mood normal.        Behavior: Behavior normal.     Results for orders placed or performed during the hospital encounter of 04/28/22  Comprehensive metabolic panel  Result Value Ref Range   Sodium 134 (L) 135 - 145 mmol/L   Potassium 3.9 3.5 - 5.1 mmol/L   Chloride 103 98 - 111 mmol/L   CO2 25 22 - 32 mmol/L   Glucose, Bld 211 (H) 70 - 99 mg/dL   BUN 11 6 - 20 mg/dL   Creatinine, Ser 1.61 0.44 - 1.00 mg/dL   Calcium 8.7 (L) 8.9 - 10.3 mg/dL   Total Protein 7.5 6.5 - 8.1 g/dL   Albumin 3.3 (L) 3.5 - 5.0 g/dL   AST 28 15 - 41 U/L   ALT 27 0 - 44 U/L   Alkaline Phosphatase 98 38 - 126 U/L   Total  Bilirubin 0.7 0.3 - 1.2 mg/dL   GFR, Estimated >09 >60 mL/min   Anion gap 6 5 - 15  TSH  Result Value Ref Range   TSH 4.542 (H) 0.350 - 4.500 uIU/mL  Lipid panel  Result Value Ref Range   Cholesterol 157 0 - 200 mg/dL   Triglycerides 454 <098 mg/dL   HDL 44 >11 mg/dL   Total CHOL/HDL Ratio 3.6 RATIO   VLDL 21 0 - 40 mg/dL   LDL Cholesterol 92 0 - 99 mg/dL  Hemoglobin B1Y  Result Value Ref Range   Hgb A1c MFr Bld 10.8 (H) 4.8 - 5.6 %   Mean Plasma Glucose 263.26 mg/dL  CBC with Differential/Platelet  Result Value Ref Range   WBC 12.0 (H) 4.0 - 10.5 K/uL   RBC 5.17 (H) 3.87 - 5.11 MIL/uL   Hemoglobin 14.3 12.0 - 15.0 g/dL   HCT 78.2 95.6 - 21.3 %   MCV 81.2 80.0 - 100.0 fL   MCH 27.7 26.0 - 34.0 pg   MCHC 34.0 30.0 - 36.0 g/dL   RDW 08.6 57.8 - 46.9 %   Platelets 228 150 - 400 K/uL   nRBC 0.0 0.0 - 0.2 %   Neutrophils Relative % 55 %   Neutro Abs 6.6 1.7 - 7.7 K/uL   Lymphocytes Relative 35 %   Lymphs Abs 4.1 (H) 0.7 - 4.0 K/uL   Monocytes Relative 6 %   Monocytes Absolute 0.7 0.1 - 1.0 K/uL   Eosinophils Relative 3 %   Eosinophils Absolute 0.4 0.0 - 0.5 K/uL   Basophils Relative 1 %   Basophils Absolute 0.1 0.0 - 0.1 K/uL   Immature Granulocytes 0 %   Abs Immature  Granulocytes 0.04 0.00 - 0.07 K/uL       Assessment & Plan:   1. Rhinosinusitis Immunocompromised on enbrel - lower threshold to start Abx Overall well appearing, do suspect viral URI  Pt can continue supportive and sx treatment and if not improving or if worsening can start doxy - doxycycline (VIBRA-TABS) 100 MG tablet; Take 1 tablet (100 mg total) by mouth 2 (two) times daily for 7 days.  Dispense: 14 tablet; Refill: 0  2. Immunocompromised state due to drug therapy (HCC) See above On enbrel and severely uncontolled DM   She is due for f/up on glucose readings and for insulin titration No severe wheeze with her cough, will avoid steroids for now - do not want to increase blood  sugars     Ashley Berry, PA-C 05/12/22 2:30 PM

## 2022-05-14 ENCOUNTER — Encounter: Payer: Self-pay | Admitting: Pharmacy Technician

## 2022-05-14 NOTE — Patient Outreach (Signed)
Patient only signed DOH Attestation.  Would need to provide current year's household income if PAP medications were needed. ? ?Marajade Lei J. Rockford Leinen ?Patient Advocate Specialist ?[facility]  ?

## 2022-05-21 DIAGNOSIS — Z1211 Encounter for screening for malignant neoplasm of colon: Secondary | ICD-10-CM | POA: Insufficient documentation

## 2022-05-29 ENCOUNTER — Other Ambulatory Visit: Payer: Self-pay | Admitting: Family Medicine

## 2022-05-29 ENCOUNTER — Telehealth (INDEPENDENT_AMBULATORY_CARE_PROVIDER_SITE_OTHER): Payer: Self-pay | Admitting: Family Medicine

## 2022-05-29 ENCOUNTER — Other Ambulatory Visit: Payer: Self-pay

## 2022-05-29 DIAGNOSIS — E1165 Type 2 diabetes mellitus with hyperglycemia: Secondary | ICD-10-CM

## 2022-05-29 DIAGNOSIS — J029 Acute pharyngitis, unspecified: Secondary | ICD-10-CM

## 2022-05-29 DIAGNOSIS — Z794 Long term (current) use of insulin: Secondary | ICD-10-CM

## 2022-05-29 DIAGNOSIS — J441 Chronic obstructive pulmonary disease with (acute) exacerbation: Secondary | ICD-10-CM

## 2022-05-29 DIAGNOSIS — E119 Type 2 diabetes mellitus without complications: Secondary | ICD-10-CM

## 2022-05-29 MED ORDER — LEVEMIR FLEXTOUCH 100 UNIT/ML ~~LOC~~ SOPN: 20.0000 [IU] | PEN_INJECTOR | Freq: Two times a day (BID) | SUBCUTANEOUS | 11 refills | Status: DC

## 2022-05-29 MED ORDER — LEVEMIR FLEXTOUCH 100 UNIT/ML ~~LOC~~ SOPN
20.0000 [IU] | PEN_INJECTOR | Freq: Two times a day (BID) | SUBCUTANEOUS | 11 refills | Status: DC
  Filled 2022-05-29: qty 15, 13d supply, fill #0

## 2022-05-29 NOTE — Progress Notes (Unsigned)
Name: Ashley Cochran   MRN: [id number]    DOB: Jan 20, 1976   Date:05/29/2022       Progress Note  Subjective:    I connected with  Ashley Cochran  on 05/29/22 at  9:00 AM EDT by a video enabled telemedicine application and verified that I am speaking with the correct person using two identifiers.  I discussed the limitations of evaluation and management by telemedicine and the availability of in person appointments. The patient expressed understanding and agreed to proceed. Staff also discussed with the patient that there may be a patient responsible charge related to this service. Patient Location: home Provider Location: [facility] Additional Individuals present: none  Chief Complaint  Patient presents with   Follow-up    Ashley Cochran is a 46 y.o. female, presents for virtual visit for routine follow up on the conditions listed above.  Started sample insulin, started at 20 and increased up to 30 units - doing one dose in the morning Toujeo sample - just started second pen 300 unit pen   Glucometer -  Checking sugars in the morning before breakfast 295 this am 225 271 369 273 235 241  Has not done trulicity or ozempic Cannot tolerate metformin Had to stop jardiance due to recurrent UTI/yeast infections Still limited by no insurance and cost of meds  She has continued nasal drainage, sore throat and loosing voice, no fever, sweats, body aches, SOB      Patient Active Problem List   Diagnosis Date Noted   Colon cancer screening 05/21/2022   Uncontrolled type 2 diabetes mellitus with hyperglycemia (HCC) 05/06/2022   Mild intermittent asthma without complication 05/06/2022   Immunocompromised state due to drug therapy (HCC) 01/23/2021   Patient's noncompliance with other medical treatment and regimen 01/23/2021   Chest pain 01/23/2021   Acute bilateral low back pain without sciatica 08/19/2020   H/O gastroesophageal reflux (GERD) 06/21/2020   Class 2 severe obesity  with serious comorbidity and body mass index (BMI) of 38.0 to 38.9 in adult (HCC) 01/26/2019   Insulin dependent type 2 diabetes mellitus, uncontrolled 01/26/2019   Dyslipidemia associated with type 2 diabetes mellitus (HCC) 01/26/2019   Hypothyroidism 04/09/2015   Rheumatoid arthritis (HCC) 04/09/2015    Current Outpatient Medications:    albuterol (PROVENTIL) (2.5 MG/3ML) 0.083% nebulizer solution, Take 3 mLs (2.5 mg total) by nebulization every 6 (six) hours as needed for wheezing or shortness of breath., Disp: 100 mL, Rfl: 1   albuterol (VENTOLIN HFA) 108 (90 Base) MCG/ACT inhaler, Inhale 2 puffs into the lungs every 6 (six) hours as needed for wheezing or shortness of breath., Disp: 6.7 g, Rfl: 1   atorvastatin (LIPITOR) 10 MG tablet, Take 1 tablet (10 mg total) by mouth at bedtime., Disp: 90 tablet, Rfl: 3   budesonide-formoterol (SYMBICORT) 80-4.5 MCG/ACT inhaler, Inhale 2 puffs into the lungs 2 (two) times daily., Disp: 6.9 g, Rfl: 1   Continuous Blood Gluc Sensor (FREESTYLE LIBRE 2 SENSOR) MISC, 1 each by Does not apply route every 14 (fourteen) days., Disp: 4 each, Rfl: 3   ENBREL SURECLICK 50 MG/ML injection, Inject into the skin., Disp: , Rfl:    fluconazole (DIFLUCAN) 150 MG tablet, Take 1 tablet (150 mg total) by mouth every 3 (three) days as needed (for vaginal itching/yeast infection sx)., Disp: 2 tablet, Rfl: 1   fluticasone (FLONASE) 50 MCG/ACT nasal spray, Place 2 sprays into both nostrils daily., Disp: 16 g, Rfl: 6   Insulin Pen Needle 29G X  MISC, Use as directed with insulin daily, Disp: 100 each, Rfl: 1   levothyroxine (SYNTHROID) 175 MCG tablet, Take 1 tablet (175 mcg total) by mouth daily before breakfast., Disp: 90 tablet, Rfl: 3   lisinopril (ZESTRIL) 2.5 MG tablet, Take 1 tablet (2.5 mg total) by mouth daily., Disp: 90 tablet, Rfl: 1   montelukast (SINGULAIR) 10 MG tablet, TAKE ONE TABLET BY MOUTH ONCE DAILY FOR ASTHMA, Disp: 90 tablet, Rfl: 3   naproxen (NAPROSYN)  500 MG tablet, naproxen 500 mg tablet  Take 1 tablet twice a day by oral route., Disp: , Rfl:    naproxen (NAPROSYN) 500 MG tablet, Take by mouth., Disp: , Rfl:    omeprazole (PRILOSEC) 20 MG capsule, Take 1 capsule (20 mg total) by mouth daily. Take more than 4 hours after taking morning levothyroxine (may also take at bedtime), Disp: 90 capsule, Rfl: 3 Allergies  Allergen Reactions   Nitrofuran Derivatives Rash    jittery   Latex    Ranitidine Hcl      Causes jitters   Strawberry Flavor    Metformin And Related Diarrhea    Could not tolerate normal and XR metformin, severe GI cramping and diarrhea    Tape Rash    No past surgical history on file. Family History  Problem Relation Age of Onset   Hypertension Mother    Diabetes Mother        type 2   Asthma Mother    Stroke Mother        Mini strokes   GER disease Mother    Hypertension Father    Diabetes Father        Type 2   Asthma Father    GER disease Father    Asthma Sister    Allergies Sister    Asthma Brother    Allergies Brother    Social History   Socioeconomic History   Marital status: Legally Separated    Spouse name: Not on file   Number of children: Not on file   Years of education: Not on file   Highest education level: Not on file  Occupational History   Not on file  Tobacco Use   Smoking status: Former    Types: Cigarettes    Quit date: 09/20/2004    Years since quitting: 17.6   Smokeless tobacco: Never  Vaping Use   Vaping Use: Never used  Substance and Sexual Activity   Alcohol use: No   Drug use: No   Sexual activity: Not Currently    Partners: Male  Other Topics Concern   Not on file  Social History Narrative   Not on file   Social Determinants of Health   Financial Resource Strain: Low Risk  (07/09/2021)   Overall Financial Resource Strain (CARDIA)    Difficulty of Paying Living Expenses: Not hard at all  Food Insecurity: No Food Insecurity (05/01/2022)   Hunger Vital Sign     Worried About Running Out of Food in the Last Year: Never true    Ran Out of Food in the Last Year: Never true  Transportation Needs: No Transportation Needs (05/01/2022)   PRAPARE - Administrator, Civil Service (Medical): No    Lack of Transportation (Non-Medical): No  Physical Activity: Inactive (07/09/2021)   Exercise Vital Sign    Days of Exercise per Week: 0 days    Minutes of Exercise per Session: 0 min  Stress: Stress Concern Present (05/01/2022)   [place]  [employer]stitute of Occupational Health - Occupational Stress Questionnaire    Feeling of Stress : To some extent  Social Connections: Socially Isolated (07/09/2021)   Social Connection and Isolation Panel [NHANES]    Frequency of Communication with Friends and Family: More than three times a week    Frequency of Social Gatherings with Friends and Family: Once a week    Attends Religious Services: Never    Database administrator or Organizations: No    Attends Banker Meetings: Never    Marital Status: Separated  Intimate Partner Violence: Not At Risk (07/26/2018)   Humiliation, Afraid, Rape, and Kick questionnaire    Fear of Current or Ex-Partner: No    Emotionally Abused: No    Physically Abused: No    Sexually Abused: No    Chart Review Today: I personally reviewed active problem list, medication list, allergies, family history, social history, health maintenance, notes from last encounter, lab results, imaging with the patient/caregiver today.   Review of Systems  Constitutional: Negative.   HENT: Negative.    Eyes: Negative.   Respiratory: Negative.    Cardiovascular: Negative.   Gastrointestinal: Negative.   Endocrine: Negative.   Genitourinary: Negative.   Musculoskeletal: Negative.   Skin: Negative.   Allergic/Immunologic: Negative.   Neurological: Negative.   Hematological: Negative.   Psychiatric/Behavioral: Negative.    All other systems reviewed and are negative.     Objective:     Virtual encounter, vitals limited, only able to obtain the following There were no vitals filed for this visit. There is no height or weight on file to calculate BMI. Nursing Note and Vital Signs reviewed.  Physical Exam Vitals and nursing note reviewed.  Constitutional:      General: She is not in acute distress.    Appearance: Normal appearance. She is obese. She is not ill-appearing, toxic-appearing or diaphoretic.  Pulmonary:     Effort: Pulmonary effort is normal. No respiratory distress.  Neurological:     Mental Status: She is alert.  Psychiatric:        Mood and Affect: Mood normal.     PE limited by telephone encounter  No results found for this or any previous visit (from the past 72 hour(s)).  PHQ2/9:    05/29/2022    8:48 AM 05/12/2022    1:54 PM 05/01/2022    1:43 PM 04/28/2022   11:57 AM 07/09/2021    7:14 PM  Depression screen PHQ 2/9  Decreased Interest 0 0 0 0 0  Down, Depressed, Hopeless 0 0 0 0 0  PHQ - 2 Score 0 0 0 0 0  Altered sleeping  0     Tired, decreased energy  0     Change in appetite  0     Feeling bad or failure about yourself   0     Trouble concentrating  0     Moving slowly or fidgety/restless  0     Suicidal thoughts  0     PHQ-9 Score  0      PHQ-2/9 Result is neg, reviewed today  Fall Risk:    05/29/2022    8:48 AM 05/12/2022    1:54 PM 04/28/2022   11:57 AM 07/03/2021    8:24 AM 02/24/2021    4:13 PM  Fall Risk   Falls in the past year? 0 0 0 0 1  Number falls in past yr: 0   0 0  Injury with Fall? 0  0 0  Risk for fall due to : No Fall Risks No Fall Risks No Fall Risks    Follow up Falls prevention discussed Falls prevention discussed Falls prevention discussed       Assessment and Plan:     ICD-10-CM   1. Uncontrolled type 2 diabetes mellitus with hyperglycemia (HCC)  E11.65 Dulaglutide (TRULICITY) 0.75 MG/0.5ML SOPN   increase insulin sample dose from 30 to 35 units - monitor fasting glucosex 3+ d, increase insulin  again +5 if CBG >200    2. Insulin dependent type 2 diabetes mellitus (HCC)  E11.9    Z79.4    see above    3. Pharyngitis, unspecified etiology  J02.9    likey allergic or URI related, recently did abx, still having sx, encouraged increased allergy meds      I discussed the assessment and treatment plan with the patient. The patient was provided an opportunity to ask questions and all were answered. The patient agreed with the plan and demonstrated an understanding of the instructions.  The patient was advised to call back or seek an in-person evaluation if the symptoms worsen or if the condition fails to improve as anticipated.  I provided 20+ minutes of non-face-to-face time during this encounter.  Pt was instructed to f/up in the next week because she will be out of insulin sample Can try trulicity med with pt going to [facility] and doing patient assistant application  Danelle Berry, PA-C 05/29/22 9:38 AM

## 2022-05-29 NOTE — Telephone Encounter (Signed)
Medication Refill - Medication: insulin detemir (LEVEMIR FLEXTOUCH) 100 UNIT/ML FlexPen  Pt is requesting medication be transferred to the pharmacy below. Pt stated [facility] is chaging her $500.00    Has the patient contacted their pharmacy? No. (Agent: If no, request that the patient contact the pharmacy for the refill. If patient does not wish to contact the pharmacy document the reason why and proceed with request.) Preferred Pharmacy (with phone number or street name):  [facility] at [facility] [address] [place],  [place]  [address]  Main: [phone]  Unable to add pharmacy to preferred pharmacies I could not find it.   Has the patient been seen for an appointment in the last year OR does the patient have an upcoming appointment? Yes.    Agent: Please be advised that RX refills may take up to 3 business days. We ask that you follow-up with your pharmacy.

## 2022-06-01 ENCOUNTER — Encounter: Payer: Self-pay | Admitting: Family Medicine

## 2022-06-01 MED ORDER — TRULICITY 0.75 MG/0.5ML ~~LOC~~ SOAJ: 0.7500 mg | SUBCUTANEOUS | 1 refills | Status: AC

## 2022-06-01 MED ORDER — LEVEMIR FLEXTOUCH 100 UNIT/ML ~~LOC~~ SOPN
20.0000 [IU] | PEN_INJECTOR | Freq: Two times a day (BID) | SUBCUTANEOUS | 11 refills | Status: DC
  Filled 2022-06-01: qty 18, 15d supply, fill #0
  Filled 2022-06-02: qty 15, 13d supply, fill #0

## 2022-06-02 ENCOUNTER — Other Ambulatory Visit: Payer: Self-pay

## 2022-06-09 ENCOUNTER — Other Ambulatory Visit: Payer: Self-pay

## 2022-06-11 ENCOUNTER — Other Ambulatory Visit: Payer: Self-pay | Admitting: Family Medicine

## 2022-06-11 DIAGNOSIS — J4520 Mild intermittent asthma, uncomplicated: Secondary | ICD-10-CM

## 2022-06-15 ENCOUNTER — Other Ambulatory Visit: Payer: Self-pay

## 2022-06-15 MED ORDER — INSULIN DETEMIR 100 UNIT/ML FLEXPEN: 20.0000 [IU] | PEN_INJECTOR | Freq: Two times a day (BID) | SUBCUTANEOUS | 3 refills | Status: DC

## 2022-06-15 MED ORDER — INSULIN PEN NEEDLE 32G X 6 MM MISC: 3 refills | Status: AC

## 2022-06-17 ENCOUNTER — Other Ambulatory Visit: Payer: Self-pay

## 2022-06-30 ENCOUNTER — Ambulatory Visit: Admit: 2022-06-30 | Discharge: 2022-07-01 | Attending: Family | Primary: Family

## 2022-06-30 DIAGNOSIS — M059 Rheumatoid arthritis with rheumatoid factor, unspecified: Principal | ICD-10-CM

## 2022-06-30 DIAGNOSIS — R202 Paresthesia of skin: Principal | ICD-10-CM

## 2022-07-05 ENCOUNTER — Other Ambulatory Visit: Payer: Self-pay | Admitting: Family Medicine

## 2022-07-05 DIAGNOSIS — J4520 Mild intermittent asthma, uncomplicated: Secondary | ICD-10-CM

## 2022-11-16 ENCOUNTER — Other Ambulatory Visit: Payer: Self-pay

## 2022-11-24 ENCOUNTER — Other Ambulatory Visit: Payer: Self-pay | Admitting: Pharmacy Technician

## 2022-11-24 ENCOUNTER — Other Ambulatory Visit: Payer: Self-pay | Admitting: Family Medicine

## 2022-11-24 ENCOUNTER — Other Ambulatory Visit: Payer: Self-pay

## 2022-11-24 DIAGNOSIS — E119 Type 2 diabetes mellitus without complications: Secondary | ICD-10-CM

## 2022-11-24 DIAGNOSIS — E1165 Type 2 diabetes mellitus with hyperglycemia: Secondary | ICD-10-CM

## 2022-11-24 MED ORDER — TRESIBA FLEXTOUCH 100 UNIT/ML ~~LOC~~ SOPN
20.0000 [IU] | PEN_INJECTOR | Freq: Two times a day (BID) | SUBCUTANEOUS | 0 refills | Status: DC
  Filled 2022-11-24: qty 15, 13d supply, fill #0

## 2022-11-24 NOTE — Progress Notes (Signed)
[employer] removed Levemir from their formulary.  Patient on this medication.  I reached out Danelle Berry.  Misty Stanley only did one rx w/o refill because this pt has been non-compliant and is required to do rechecks in office with [facility] and she continues to not come back but she also refuses to go anywhere else - like a community health center or endo with charity care etc.  Sherilyn Dacosta Patient Advocate Specialist [facility] at [facility]

## 2022-11-24 NOTE — Progress Notes (Signed)
Patient stated that she is no longer lives in [place].  Is now a resident of [place].  Ashley Cochran Patient Advocate Specialist [facility] at [facility]

## 2022-11-30 DIAGNOSIS — M059 Rheumatoid arthritis with rheumatoid factor, unspecified: Principal | ICD-10-CM

## 2022-11-30 MED ORDER — ETANERCEPT 50 MG/ML (1 ML) SUBCUTANEOUS PEN INJECTOR
SUBCUTANEOUS | 3 refills | 84 days | Status: CP
Start: 2022-11-30 — End: ?

## 2022-12-03 DIAGNOSIS — M059 Rheumatoid arthritis with rheumatoid factor, unspecified: Principal | ICD-10-CM

## 2023-05-12 ENCOUNTER — Other Ambulatory Visit: Payer: Self-pay

## 2024-01-14 DIAGNOSIS — M059 Rheumatoid arthritis with rheumatoid factor, unspecified: Principal | ICD-10-CM

## 2024-01-14 MED ORDER — ETANERCEPT 50 MG/ML (1 ML) SUBCUTANEOUS PEN INJECTOR
SUBCUTANEOUS | 3 refills | 84.00 days | Status: CP
Start: 2024-01-14 — End: ?

## 2024-01-20 DIAGNOSIS — M059 Rheumatoid arthritis with rheumatoid factor, unspecified: Principal | ICD-10-CM
